# Patient Record
Sex: Male | Born: 1940 | Race: White | Hispanic: No | Marital: Married | Smoking: Never smoker
Health system: Southern US, Community
[De-identification: ages and names within clinical notes are randomized; demographics above are authoritative.]

## PROBLEM LIST (undated history)

## (undated) DIAGNOSIS — I10 Essential (primary) hypertension: Secondary | ICD-10-CM

## (undated) DIAGNOSIS — I2510 Atherosclerotic heart disease of native coronary artery without angina pectoris: Secondary | ICD-10-CM

## (undated) DIAGNOSIS — G9001 Carotid sinus syncope: Secondary | ICD-10-CM

## (undated) DIAGNOSIS — E785 Hyperlipidemia, unspecified: Secondary | ICD-10-CM

## (undated) DIAGNOSIS — K4090 Unilateral inguinal hernia, without obstruction or gangrene, not specified as recurrent: Secondary | ICD-10-CM

## (undated) DIAGNOSIS — I252 Old myocardial infarction: Secondary | ICD-10-CM

## (undated) HISTORY — DX: Essential (primary) hypertension: I10

## (undated) HISTORY — DX: Carotid sinus syncope: G90.01

## (undated) HISTORY — PX: CARDIAC CATHETERIZATION: SHX172

## (undated) HISTORY — PX: APPENDECTOMY: SHX54

## (undated) HISTORY — DX: Atherosclerotic heart disease of native coronary artery without angina pectoris: I25.10

## (undated) HISTORY — PX: COLON SURGERY: SHX602

## (undated) HISTORY — PX: COLONOSCOPY: SHX174

## (undated) HISTORY — DX: Old myocardial infarction: I25.2

## (undated) HISTORY — DX: Unilateral inguinal hernia, without obstruction or gangrene, not specified as recurrent: K40.90

## (undated) HISTORY — PX: OTHER SURGICAL HISTORY: SHX169

## (undated) HISTORY — DX: Hyperlipidemia, unspecified: E78.5

---

## 1998-08-26 ENCOUNTER — Encounter: Admission: RE | Admit: 1998-08-26 | Discharge: 1998-11-24 | Payer: Self-pay | Admitting: Family Medicine

## 1999-08-28 ENCOUNTER — Encounter: Payer: Self-pay | Admitting: Emergency Medicine

## 1999-08-28 ENCOUNTER — Inpatient Hospital Stay (HOSPITAL_COMMUNITY): Admission: EM | Admit: 1999-08-28 | Discharge: 1999-08-30 | Payer: Self-pay | Admitting: Emergency Medicine

## 2002-10-14 ENCOUNTER — Inpatient Hospital Stay (HOSPITAL_COMMUNITY): Admission: EM | Admit: 2002-10-14 | Discharge: 2002-10-17 | Payer: Self-pay | Admitting: Emergency Medicine

## 2002-10-14 ENCOUNTER — Encounter: Payer: Self-pay | Admitting: Emergency Medicine

## 2004-12-24 ENCOUNTER — Ambulatory Visit: Payer: Self-pay | Admitting: Cardiology

## 2004-12-29 ENCOUNTER — Ambulatory Visit: Payer: Self-pay | Admitting: Cardiology

## 2006-06-01 ENCOUNTER — Ambulatory Visit: Payer: Self-pay | Admitting: Cardiovascular Disease

## 2006-06-07 ENCOUNTER — Ambulatory Visit: Payer: Self-pay | Admitting: Cardiovascular Disease

## 2006-12-08 ENCOUNTER — Ambulatory Visit: Payer: Self-pay | Admitting: Cardiology

## 2006-12-13 ENCOUNTER — Ambulatory Visit: Payer: Self-pay | Admitting: Cardiology

## 2007-12-13 ENCOUNTER — Ambulatory Visit: Payer: Self-pay | Admitting: Internal Medicine

## 2007-12-13 LAB — CONVERTED CEMR LAB
ALT: 15 units/L (ref 0–53)
AST: 18 units/L (ref 0–37)
Albumin: 4.1 g/dL (ref 3.5–5.2)
Alkaline Phosphatase: 59 units/L (ref 39–117)
BUN: 16 mg/dL (ref 6–23)
Bilirubin, Direct: 0.2 mg/dL (ref 0.0–0.3)
CO2: 28 meq/L (ref 19–32)
Calcium: 9.1 mg/dL (ref 8.4–10.5)
Chloride: 103 meq/L (ref 96–112)
Cholesterol: 120 mg/dL (ref 0–200)
Creatinine, Ser: 1.1 mg/dL (ref 0.4–1.5)
GFR calc Af Amer: 86 mL/min
GFR calc non Af Amer: 71 mL/min
Glucose, Bld: 176 mg/dL — ABNORMAL HIGH (ref 70–99)
HDL: 31.5 mg/dL — ABNORMAL LOW (ref >39.0)
LDL Cholesterol: 67 mg/dL (ref 0–99)
Potassium: 4.3 meq/L (ref 3.5–5.1)
Sodium: 138 meq/L (ref 135–145)
Total Bilirubin: 2.3 mg/dL — ABNORMAL HIGH (ref 0.3–1.2)
Total CHOL/HDL Ratio: 3.8
Total Protein: 7 g/dL (ref 6.0–8.3)
Triglycerides: 106 mg/dL (ref 0–149)
VLDL: 21 mg/dL (ref 0–40)

## 2007-12-21 ENCOUNTER — Ambulatory Visit: Payer: Self-pay | Admitting: Internal Medicine

## 2009-01-23 ENCOUNTER — Ambulatory Visit: Payer: Self-pay | Admitting: Internal Medicine

## 2009-01-23 LAB — CONVERTED CEMR LAB
ALT: 14 units/L (ref 0–53)
AST: 17 units/L (ref 0–37)
Albumin: 3.9 g/dL (ref 3.5–5.2)
Alkaline Phosphatase: 69 units/L (ref 39–117)
BUN: 17 mg/dL (ref 6–23)
Bilirubin, Direct: 0.1 mg/dL (ref 0.0–0.3)
CO2: 29 meq/L (ref 19–32)
Calcium: 8.6 mg/dL (ref 8.4–10.5)
Chloride: 103 meq/L (ref 96–112)
Cholesterol: 106 mg/dL (ref 0–200)
Creatinine, Ser: 1.1 mg/dL (ref 0.4–1.5)
GFR calc Af Amer: 85 mL/min
GFR calc non Af Amer: 71 mL/min
Glucose, Bld: 161 mg/dL — ABNORMAL HIGH (ref 70–99)
HDL: 31.7 mg/dL — ABNORMAL LOW (ref >39.0)
LDL Cholesterol: 55 mg/dL (ref 0–99)
Potassium: 4.3 meq/L (ref 3.5–5.1)
Sodium: 139 meq/L (ref 135–145)
Total Bilirubin: 1.7 mg/dL — ABNORMAL HIGH (ref 0.3–1.2)
Total CHOL/HDL Ratio: 3.3
Total Protein: 6.6 g/dL (ref 6.0–8.3)
Triglycerides: 98 mg/dL (ref 0–149)
VLDL: 20 mg/dL (ref 0–40)

## 2009-01-29 ENCOUNTER — Encounter: Payer: Self-pay | Admitting: Internal Medicine

## 2009-01-29 ENCOUNTER — Ambulatory Visit: Payer: Self-pay | Admitting: Internal Medicine

## 2009-01-29 DIAGNOSIS — G909 Disorder of the autonomic nervous system, unspecified: Secondary | ICD-10-CM | POA: Insufficient documentation

## 2009-01-29 DIAGNOSIS — I10 Essential (primary) hypertension: Secondary | ICD-10-CM | POA: Insufficient documentation

## 2009-01-29 DIAGNOSIS — I2510 Atherosclerotic heart disease of native coronary artery without angina pectoris: Secondary | ICD-10-CM | POA: Insufficient documentation

## 2009-01-29 DIAGNOSIS — E785 Hyperlipidemia, unspecified: Secondary | ICD-10-CM | POA: Insufficient documentation

## 2009-07-02 ENCOUNTER — Encounter: Payer: Self-pay | Admitting: Internal Medicine

## 2009-08-26 ENCOUNTER — Telehealth (INDEPENDENT_AMBULATORY_CARE_PROVIDER_SITE_OTHER): Payer: Self-pay | Admitting: *Deleted

## 2009-08-30 ENCOUNTER — Telehealth: Payer: Self-pay | Admitting: Internal Medicine

## 2009-12-19 ENCOUNTER — Encounter: Payer: Self-pay | Admitting: Internal Medicine

## 2010-01-16 ENCOUNTER — Ambulatory Visit: Payer: Self-pay | Admitting: Internal Medicine

## 2010-01-19 ENCOUNTER — Inpatient Hospital Stay (HOSPITAL_COMMUNITY): Admission: EM | Admit: 2010-01-19 | Discharge: 2010-01-21 | Payer: Self-pay | Admitting: Emergency Medicine

## 2010-01-23 ENCOUNTER — Telehealth: Payer: Self-pay | Admitting: Cardiology

## 2010-01-27 ENCOUNTER — Telehealth (INDEPENDENT_AMBULATORY_CARE_PROVIDER_SITE_OTHER): Payer: Self-pay | Admitting: *Deleted

## 2010-01-28 ENCOUNTER — Ambulatory Visit: Payer: Self-pay | Admitting: Cardiology

## 2010-01-28 ENCOUNTER — Encounter (HOSPITAL_COMMUNITY): Admission: RE | Admit: 2010-01-28 | Discharge: 2010-04-08 | Payer: Self-pay | Admitting: Cardiology

## 2010-01-28 ENCOUNTER — Ambulatory Visit: Payer: Self-pay

## 2010-02-03 ENCOUNTER — Telehealth: Payer: Self-pay | Admitting: Internal Medicine

## 2010-02-13 ENCOUNTER — Encounter: Payer: Self-pay | Admitting: Internal Medicine

## 2010-05-15 ENCOUNTER — Encounter: Payer: Self-pay | Admitting: Internal Medicine

## 2010-08-14 ENCOUNTER — Encounter: Payer: Self-pay | Admitting: Internal Medicine

## 2011-01-06 NOTE — Assessment & Plan Note (Signed)
Summary: Cardiology Nuclear Study  Nuclear Med Background Indications for Stress Test: Evaluation for Ischemia   History: Echo, Heart Catheterization, Myocardial Infarction, Stents  History Comments: '03 MI > Heart Cath; EF=45%, Stents: LAD, CFX; patent previous RCA stent '04 Echo: Echo: NL, LVF     Nuclear Pre-Procedure Cardiac Risk Factors: Hypertension, Lipids Caffeine/Decaff Intake: None NPO After: 7:00 PM Lungs: clear IV 0.9% NS with Angio Cath: 18g     IV Site: (R) AC IV Started by: Stanton Kidney EMT-P Chest Size (in) 44     Height (in): 74 Weight (lb): 230 BMI: 29.64 Tech Comments: CBG= 96 @ 5:30 am this day, per Patient.  Nuclear Med Study 1 or 2 day study:  1 day     Stress Test Type:  Eugenie Birks Reading MD:  Olga Millers, MD     Referring MD:  D.Mclean Resting Radionuclide:  Technetium 60m Tetrofosmin     Resting Radionuclide Dose:  10.9 mCi  Stress Radionuclide:  Technetium 57m Tetrofosmin     Stress Radionuclide Dose:  31.0 mCi   Stress Protocol   Lexiscan: 0.4 mg   Stress Test Technologist:  Milana Na EMT-P     Nuclear Technologist:  Burna Mortimer Deal RT-N  Rest Procedure   Myocardial perfusion imaging was performed at rest 45 minutes following the intravenous administration of Myoview Technetium 78m Tetrofosmin.  Stress Procedure  The patient received IV Lexiscan 0.4 mg over 15-seconds.  Myoview injected at 30-seconds.  There were no significant changes with infusion.  Quantitative spect images were obtained after a 45 minute delay.  QPS Raw Data Images:  Acuisition technically good; normal left ventricular size. Stress Images:  There is decreased uptake in the distal anterior wall, distal inferior wall and apex. Rest Images:  There is decreased uptake in the distal anterior wall, distal inferior wall and apex; inferior defect slightly less prominent compared to the stress images. Subtraction (SDS):  These findings are consistent with small prior infarct  and minimal peri-infarct ischemia. Transient Ischemic Dilatation:  .97  (Normal <1.22)  Lung/Heart Ratio:  .24  (Normal <0.45)  Quantitative Gated Spect Images QGS EDV:  119 ml QGS ESV:  54 ml QGS EF:  55 % QGS cine images:  There is apical hypokinesis.   Overall Impression  Exercise Capacity: Lexiscan study with no exercise. BP Response: Normal blood pressure response. Clinical Symptoms: No chest pain ECG Impression: No significant ST segment change suggestive of ischemia. Overall Impression: Low risk lexiscan nuclear study with small prior infarct in the distal anterior wall, apex and distal inferior wall; minimal peri-infarct ischemia in the distal inferior wall.  Appended Document: Cardiology Nuclear Study ok  Appended Document: Cardiology Nuclear Study Left message to call back   Appended Document: Cardiology Nuclear Study pt aware per phone note 2/28

## 2011-01-06 NOTE — Assessment & Plan Note (Signed)
Summary: E4V   Primary Provider:  Dr Jeannetta Nap  CC:  no complaints.  History of Present Illness: Timothy [name] is a delightful 70 year old male with a history of coronary artery disease status post non-ST elevation myocardial infarction in 2001 and 2003.Both times had MI without previous anginal pain.  He has been treated with a bare-metal stent to the right coronary artery and Cypher drug-eluting stent to the circumflex. He has normal LV function. Medical history is also notable for diabetes, hypertension and hyperlipidemia. Previously on niacin but he stopped as he felt it wasn't helping.  He returns today for yearly f/u. No longer walking regulary due severe knee arthritis. Deos exercise bike for 20-30 minutes severeal tiems per week. No CP or SOB. Participating in clinicat trial at [facility] for diabetic medication. Keeping a close eye on his BP, lipids and weight.  Last lipid check TC 142 TG 160 HDL 40 LDL 70   Current Medications (verified): 1)  Metformin Hcl 1000 Mg Tabs (Metformin Hcl) .... Take 1 Tablet By Mouth Two Times A Day 2)  Ramipril 10 Mg Caps (Ramipril) .... Take One Capsule By Mouth Daily 3)  Plavix 75 Mg Tabs (Clopidogrel Bisulfate) .Marland Kitchen.. 1 Tab By Mouth Once Daily 4)  Metoprolol Tartrate 25 Mg Tabs (Metoprolol Tartrate) .... Take One Tablet By Mouth Twice A Day 5)  Pravastatin Sodium 40 Mg Tabs (Pravastatin Sodium) .... Take One Tablet By Mouth Daily At Bedtime 6)  Aspirin 81 Mg Tbec (Aspirin) .... Take One Tablet By Mouth Daily 7)  Glipizide 10 Mg Tabs (Glipizide) .Marland Kitchen.. 1 Tab By Mouth Two Times A Day 8)  Loratadine 10 Mg Tabs (Loratadine) .... Once Daily  Allergies (verified): No Known Drug Allergies  Vital Signs:  Patient profile:   70 year old male Height:      74 inches Weight:      232 pounds BMI:     29.89 Pulse rate:   55 / minute BP sitting:   132 / 74  (left arm) Cuff size:   regular  Vitals Entered By: Hardin Negus, RMA (January 16, 2010 11:29  AM)   Impression & Recommendations:  Problem # 1:  CAD, NATIVE VESSEL (ICD-414.01) Doing well though function status limited due to knee pain. Suggested he consider an ortho eval to see if he needs a knee relpacement. Given the fact that he did not have any angina prior to previous MIs and he is now 8 years out from last stent will move forward with [facility]  Problem # 2:  HYPERTENSION, BENIGN (ICD-401.1) Blood pressure well controlled. Continue current regimen.  Problem # 3:  HYPERLIPIDEMIA-MIXED (ICD-272.4) Lipids look good. LDL at goal <=70. Continue current regimen.  Other Orders: EKG w/ Interpretation (93000) Nuclear Stress Test (Nuc Stress Test)  Patient Instructions: 1)  Your physician has requested that you have a Lexiscan myoview.  For further information please visit [email or web address]  Please follow instruction sheet, as given. 2)  Follow up in 1 year

## 2011-01-06 NOTE — Progress Notes (Signed)
Summary: rtn call from friday  Phone Note Call from Patient Call back at Home Phone [phone]   Caller: Patient Reason for Call: Talk to Nurse, Talk to Doctor Summary of Call: pt rtrn call from Heather schubb from friday/lg Initial call taken by: Omer Jack,  February 03, 2010 10:49 AM  Follow-up for Phone Call        Called patient with stress test results...per Dr.Dan Wrigley Winborne test is OK. Follow-up by: Suzan Garibaldi RN

## 2011-01-06 NOTE — Progress Notes (Signed)
Summary: pt wants to talk about testing next week  Phone Note Call from Patient Call back at Home Phone [phone]   Caller: Patient Reason for Call: Talk to Nurse, Talk to Doctor Summary of Call: pt is scheduled for a stress test nest week and he just got out of the hospital with rectal bleedign and he wasn't sure if he would be in shape for the test so he wants to discuss this with you Initial call taken by: Omer Jack,  January 23, 2010 9:24 AM  Follow-up for Phone Call        spoke w/pt Meredith Staggers, RN  January 23, 2010 9:57 AM

## 2011-01-06 NOTE — Progress Notes (Signed)
Summary: Nuclear Pre-Procedure  Phone Note Outgoing Call Call back at [facility] Phone [phone]   Call placed by: Stanton Kidney, EMT-P,  January 27, 2010 2:40 PM Action Taken: Phone Call Completed Summary of Call: Reviewed information on Myoview Information Sheet (see scanned document for further details).  Spoke with Patient's wife.     Nuclear Med Background Indications for Stress Test: Evaluation for Ischemia   History: Echo, Heart Catheterization, Myocardial Infarction, Stents  History Comments: '03 MI > Heart Cath; EF=45%, Stents: LAD, CFX; patent previous RCA stent '04 Echo: Echo: NL, LVF     Nuclear Pre-Procedure Cardiac Risk Factors: Hypertension, Lipids Height (in): 74

## 2011-02-25 LAB — CBC
HCT: 21.6 % — ABNORMAL LOW (ref 39.0–52.0)
HCT: 23.2 % — ABNORMAL LOW (ref 39.0–52.0)
HCT: 26.2 % — ABNORMAL LOW (ref 39.0–52.0)
HCT: 36.6 % — ABNORMAL LOW (ref 39.0–52.0)
Hemoglobin: 12.6 g/dL — ABNORMAL LOW (ref 13.0–17.0)
Hemoglobin: 7.6 g/dL — ABNORMAL LOW (ref 13.0–17.0)
Hemoglobin: 8 g/dL — ABNORMAL LOW (ref 13.0–17.0)
Hemoglobin: 9.6 g/dL — ABNORMAL LOW (ref 13.0–17.0)
MCHC: 34.1 g/dL (ref 30.0–36.0)
MCHC: 34.3 g/dL (ref 30.0–36.0)
MCHC: 35 g/dL (ref 30.0–36.0)
MCV: 94.1 fL (ref 78.0–100.0)
MCV: 94.5 fL (ref 78.0–100.0)
MCV: 95.3 fL (ref 78.0–100.0)
MCV: 95.5 fL (ref 78.0–100.0)
Platelets: 154 10*3/uL (ref 150–400)
Platelets: 182 10*3/uL (ref 150–400)
RBC: 2.44 MIL/uL — ABNORMAL LOW (ref 4.22–5.81)
RBC: 2.95 MIL/uL — ABNORMAL LOW (ref 4.22–5.81)
RBC: 2.95 MIL/uL — ABNORMAL LOW (ref 4.22–5.81)
RDW: 12.9 % (ref 11.5–15.5)
RDW: 12.9 % (ref 11.5–15.5)
RDW: 13.6 % (ref 11.5–15.5)
WBC: 11.1 10*3/uL — ABNORMAL HIGH (ref 4.0–10.5)
WBC: 6.1 10*3/uL (ref 4.0–10.5)
WBC: 7.6 10*3/uL (ref 4.0–10.5)
WBC: 8.2 10*3/uL (ref 4.0–10.5)

## 2011-02-25 LAB — TYPE AND SCREEN: Antibody Screen: NEGATIVE

## 2011-02-25 LAB — GLUCOSE, CAPILLARY
Glucose-Capillary: 124 mg/dL — ABNORMAL HIGH (ref 70–99)
Glucose-Capillary: 131 mg/dL — ABNORMAL HIGH (ref 70–99)
Glucose-Capillary: 134 mg/dL — ABNORMAL HIGH (ref 70–99)
Glucose-Capillary: 150 mg/dL — ABNORMAL HIGH (ref 70–99)
Glucose-Capillary: 168 mg/dL — ABNORMAL HIGH (ref 70–99)
Glucose-Capillary: 239 mg/dL — ABNORMAL HIGH (ref 70–99)

## 2011-02-25 LAB — ABO/RH: ABO/RH(D): O NEG

## 2011-02-25 LAB — DIFFERENTIAL
Basophils Relative: 1 % (ref 0–1)
Eosinophils Relative: 3 % (ref 0–5)
Lymphocytes Relative: 11 % — ABNORMAL LOW (ref 12–46)
Lymphocytes Relative: 22 % (ref 12–46)
Lymphs Abs: 1.2 10*3/uL (ref 0.7–4.0)
Monocytes Absolute: 0.3 10*3/uL (ref 0.1–1.0)
Monocytes Relative: 3 % (ref 3–12)
Monocytes Relative: 8 % (ref 3–12)
Neutro Abs: 4 10*3/uL (ref 1.7–7.7)
Neutrophils Relative %: 63 % (ref 43–77)

## 2011-02-25 LAB — POCT I-STAT, CHEM 8
BUN: 31 mg/dL — ABNORMAL HIGH (ref 6–23)
Creatinine, Ser: 1.2 mg/dL (ref 0.4–1.5)
Hemoglobin: 12.6 g/dL — ABNORMAL LOW (ref 13.0–17.0)
Potassium: 4.6 mEq/L (ref 3.5–5.1)
Sodium: 139 mEq/L (ref 135–145)
TCO2: 27 mmol/L (ref 0–100)

## 2011-02-25 LAB — COMPREHENSIVE METABOLIC PANEL
Alkaline Phosphatase: 38 U/L — ABNORMAL LOW (ref 39–117)
BUN: 16 mg/dL (ref 6–23)
Calcium: 7.5 mg/dL — ABNORMAL LOW (ref 8.4–10.5)
Creatinine, Ser: 1.06 mg/dL (ref 0.4–1.5)
Glucose, Bld: 122 mg/dL — ABNORMAL HIGH (ref 70–99)
Potassium: 3.9 mEq/L (ref 3.5–5.1)
Total Protein: 4.4 g/dL — ABNORMAL LOW (ref 6.0–8.3)

## 2011-02-25 LAB — BASIC METABOLIC PANEL
CO2: 21 mEq/L (ref 19–32)
Calcium: 8 mg/dL — ABNORMAL LOW (ref 8.4–10.5)
Chloride: 112 mEq/L (ref 96–112)
GFR calc Af Amer: >60 mL/min (ref >60)
Sodium: 142 mEq/L (ref 135–145)

## 2011-02-25 LAB — CARDIAC PANEL(CRET KIN+CKTOT+MB+TROPI)
CK, MB: 1.3 ng/mL (ref 0.3–4.0)
Total CK: 34 U/L (ref 7–232)
Troponin I: 0.01 ng/mL (ref 0.00–0.06)

## 2011-02-25 LAB — PROTIME-INR: INR: 1.11 (ref 0.00–1.49)

## 2011-02-25 LAB — CK TOTAL AND CKMB (NOT AT ARMC)
CK, MB: 1.3 ng/mL (ref 0.3–4.0)
Relative Index: INVALID (ref 0.0–2.5)
Total CK: 39 U/L (ref 7–232)

## 2011-02-25 LAB — PHOSPHORUS
Phosphorus: 2.5 mg/dL (ref 2.3–4.6)
Phosphorus: 2.6 mg/dL (ref 2.3–4.6)

## 2011-02-25 LAB — TSH: TSH: 1.75 u[IU]/mL (ref 0.350–4.500)

## 2011-02-25 LAB — HEMOGLOBIN A1C
Hgb A1c MFr Bld: 6.7 % — ABNORMAL HIGH (ref 4.6–6.1)
Mean Plasma Glucose: 146 mg/dL

## 2011-02-25 LAB — MRSA PCR SCREENING: MRSA by PCR: NEGATIVE

## 2011-03-10 ENCOUNTER — Telehealth: Payer: Self-pay | Admitting: Internal Medicine

## 2011-03-11 ENCOUNTER — Other Ambulatory Visit: Payer: Self-pay | Admitting: *Deleted

## 2011-03-11 MED ORDER — CLOPIDOGREL BISULFATE 75 MG PO TABS: 75.0000 mg | ORAL_TABLET | Freq: Every day | ORAL | Status: DC

## 2011-03-11 MED ORDER — RAMIPRIL 10 MG PO CAPS: 10.0000 mg | ORAL_CAPSULE | Freq: Every day | ORAL | Status: DC

## 2011-04-17 ENCOUNTER — Other Ambulatory Visit: Payer: Self-pay | Admitting: *Deleted

## 2011-04-17 MED ORDER — PRAVASTATIN SODIUM 40 MG PO TABS: 40.0000 mg | ORAL_TABLET | Freq: Every evening | ORAL | Status: DC

## 2011-04-17 MED ORDER — METOPROLOL TARTRATE 25 MG PO TABS: 25.0000 mg | ORAL_TABLET | Freq: Two times a day (BID) | ORAL | Status: DC

## 2011-04-20 ENCOUNTER — Telehealth: Payer: Self-pay | Admitting: Internal Medicine

## 2011-04-20 NOTE — Telephone Encounter (Signed)
Pt called in Pres refill Friday of last week.  Metoprolol, Pravastin,  [facility] on [address]. Not received.

## 2011-04-20 NOTE — Telephone Encounter (Signed)
Done on 5/11

## 2011-04-21 NOTE — Assessment & Plan Note (Signed)
[facility]                            CARDIOLOGY OFFICE NOTE   NAME:[name], [name]                        MRN:          [id number]  DATE:12/21/2007                            DOB:          [birth year]    PRIMARY CARE PHYSICIAN:  Windle Guard, MD   INTERVAL HISTORY:  Mr. Timothy [name] is a delightful 70 year old male,  previously followed by Dr. Samule Ohm.  He has a history of coronary artery  disease, status post non ST elevation myocardial infarction x2 in 2001  and 2003, treated with bare metal stenting of the right coronary artery  and Cypher drug-eluting stent to the circumflex.  He has normal LV  function.  Medical history is also notable for diabetes, hypertension,  and hyperlipidemia.   He returns today for his yearly followup.  He is doing great.  He  continues to exercise.  He is walking about 3-4 times a week for 30 to  40 minutes.  He is limited by his knee pain.  He denies any chest pain  or shortness of breath.  He had been taking his blood pressure somewhat  regularly and systolics have been running in the 120 to 130 range.  He  has not had any heart failure symptoms, no palpitations, syncope, or  presyncope.   CURRENT MEDICATIONS:  1. Altace 10 a day.  2. Plavix 75 a day.  3. Toprol XL 25 a day.  4. Lipitor 40 a day.  5. Glipizide 10 b.i.d.  6. Aspirin 81.  7. Metformin 1000 b.i.d.   PHYSICAL EXAMINATION:  He is well-appearing, in no acute distress,  ambulatory in the clinic without any respiratory difficulty.  Blood  pressure is 130/70.  Heart rate is 64.  Weight is 241.  HEENT:  Normal.  NECK:  Supple.  There is no JVD.  Carotids are 2+ bilateral, without  bruits.  There is no lymphadenopathy or thyromegaly.  CARDIAC:  PMI is not displaced, irregular rate and rhythm.  No murmurs,  rubs, or gallops.  LUNGS:  Clear.  ABDOMEN:  Soft, nontender, nondistended.  Mildly obese.  There is no  hepatosplenomegaly, no bruits, no masses.   Good bowel sounds.  EXTREMITIES:  Warm with no cyanosis, clubbing, or edema, no rash.  NEURO:  Alert and oriented x3.  Cranial nerves 2-12 grossly intact.  Moves all 4 extremities without difficulty.  Affect is very pleasant.   EKG shows normal sinus rhythm at a rate of 61.  No significant ST-T wave  abnormalities.  There are U-waves.   ASSESSMENT AND PLAN:  1. Coronary artery disease status post previous myocardial infarction.      He is doing well with no evidence of ischemia.  Continue current      therapy.  2. Hypertension.  Blood pressure is on the upper end of normal.      Continue Toprol and Altace for now.  I did tell him if he notices      that his systolics are persistently over 130, I would probably      change him over to Coreg 6.25 b.i.d.  3. Hyperlipidemia.  Most recent cholesterol panel showed a total      cholesterol of 120, HDL of 32, LDL of 67, triglycerides of 106.      Bilirubin is chronically elevated at 2.3.  LFTs are otherwise      normal.  His LDL is at goal.  HDL is low.  He has previously failed      Niaspan due to side effects and was also unable to tolerated fish      oil.  We have decided to retry low dose fish oil 1 g once a day,      and if he tolerates this he can go to b.i.d.  4. Diabetes, followed by Dr. Jeannetta Nap.   DISPOSITION:  We will see him back in 1 year.  Dr. Jeannetta Nap scheduled to  check lipids in 3-4 months.  I would like to see a copy of these.     Bevelyn Buckles. Bensimhon, MD  Electronically Signed    DRB/MedQ  DD: 12/21/2007  DT: 12/21/2007  Job #: [id number]   cc:   Windle Guard, M.D.

## 2011-04-24 NOTE — H&P (Signed)
NAME:  Timothy [name], Timothy [name] NO.:  [id number]   MEDICAL RECORD NO.:  [id number]                   PATIENT TYPE:  INP   LOCATION:  2929                                 FACILITY:  [facility]   PHYSICIAN:  Arvilla Meres, M.D. [facility]          DATE OF BIRTH:  May 15, 1941   DATE OF ADMISSION:  10/14/2002  DATE OF DISCHARGE:                                HISTORY & PHYSICAL   PRIMARY CARE PHYSICIAN:  Osvaldo Shipper. Spruill, M.D. in [place].   PRIMARY CARDIOLOGIST:  Veneda Melter, M.D. of [facility].   HISTORY OF PRESENT ILLNESS:  The patient is a very pleasant 70 year old  white male with a history of coronary artery disease, status post non-ST  elevation myocardial infarction in 9/01, which was treated with percutaneous  transluminal coronary angioplasty and stent of his right coronary artery,  history of diabetes who was admitted through the ER for acute coronary  syndrome.   The patient's cardiac history began in 9/01, when he presented with a non-ST  elevation myocardial infarction.  Catheterization at that time showed a  normal left ventricular ejection fraction with a totally occluded right  coronary artery.  His left anterior descending had 50% proximal lesion, as  well as a 50% distal lesion.  There was an 80% stenosis in the first  diagonal.  The ramus had a 30% proximal lesion, and the left circumflex had  a 30 to 40% proximal lesion.  He underwent a successful percutaneous  transluminal coronary angioplasty and stent of his proximal right coronary  artery.  Also had multiple angioplasties without stent to the distal right  coronary artery at that time.   Since that time, he has done very well without any further chest pain.  He  remains quite active around the house, though he does not participate in a  formal exercise program.  Over the past week he has had progressive anginal  symptoms.  Today, he had more then 10 episodes of severe chest pain at  rest  which required multiple nitroglycerin tablets.  His wife finally convinced  him to come to the emergency room.  In the emergency room, he continued to  have 8/10 chest pain despite high dose IV nitroglycerin and multiple doses  of intravenous morphine.   REVIEW OF SYMPTOMS:  He denies any fevers, chills, nausea, vomiting, bright  red blood per rectum, or melena.   PAST MEDICAL HISTORY:  1. Coronary artery disease, status post non-ST elevation myocardial     infarction in 9/01, status post right coronary artery percutaneous     transluminal coronary angioplasty and stent as per HPI.  2. Diabetes mellitus type 2, diet controlled.   MEDICATIONS:  1. Aspirin 325 mg.  2. Nitroglycerin p.r.n.   ALLERGIES:  No known drug allergies.   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  He lives with his wife in [place],  [place].  He denies any tobacco or alcohol.   PHYSICAL EXAMINATION:  GENERAL:  He is uncomfortable, diaphoretic.  VITAL SIGNS:  He is afebrile with a blood pressure of 140/80, heart rate of  65.  NECK:  His JVP is flat, his carotids are 2+ bilaterally without bruits.  CARDIAC:  Regular rate and rhythm, no murmurs, rubs, or gallops.  LUNGS:  Clear to auscultation.  ABDOMEN:  Soft, benign.  EXTREMITIES:  No cyanosis, clubbing, or edema.  His femoral pulses are 2+  bilaterally without bruits.  Dorsalis pedis pulses are 2+ bilaterally.   LABORATORY DATA:  His white blood cell count is 7.4, hematocrit 39%,  platelets 223,000.  Electrolytes are pending.  His total CK is 131, MB and  troponin-I are pending.  His PTT is 27, his INR is 0.9.  EKG is sinus  bradycardia with a rate of 60.  There is no ischemia.  Chest x-ray shows no  acute disease.   ASSESSMENT AND PLAN:  This is a 70 year old white male with a history of  coronary artery disease, who presents with unstable angina/acute coronary  syndrome.   Given his refractory pain despite high dose nitroglycerin and  several doses of morphine, I discussed his case with Dr. Charlies Constable, and  the plan will be to take him emergently to the cardiac catheterization  laboratory.  We will continue to treat him with IV nitroglycerin, morphine,  aspirin, heparin, and Integrilin.  I have held his beta blocker secondary to  his bradycardia, and have not given him Plavix as he might be a potential  coronary artery bypass grafting candidate.  Given his coronary disease, I  think it is reasonable to start him on Simvastatin for production from  recurrent coronary events.   As for his diabetes, his glucose is quite elevated in the emergency room,  however, this may just be due to a stress reaction.  We will check a  hemoglobin A1C, and I suspect he may need an oral agent.  Given his diabetes  and coronary disease, he would benefit from the addition of an ACE inhibitor  such as Ramipril once he is finished with his catheterization.                                               Arvilla Meres, M.D. [facility]    DB/MEDQ  D:  10/14/2002  T:  10/16/2002  Job:  [id number]

## 2011-04-24 NOTE — Cardiovascular Report (Signed)
NAME:  Timothy [name], Timothy [name] NO.:  [id number]   MEDICAL RECORD NO.:  [id number]                   PATIENT TYPE:  INP   LOCATION:  2929                                 FACILITY:  [facility]   PHYSICIAN:  Charlies Constable, M.D. [facility]              DATE OF BIRTH:  1941-01-14   DATE OF PROCEDURE:  10/16/2002  DATE OF DISCHARGE:                              CARDIAC CATHETERIZATION   CLINICAL HISTORY:  The patient is 70 years old and had stenting of the right  coronary artery 2 or 3 years ago and came in with non-Q-wave anterior wall  myocardial infarction Saturday night and underwent stenting of a 99%  stenosis in the LAD with TIMI-1 flow.  He was brought back today for  intervention on the mid circumflex lesion.   PROCEDURES PERFORMED:  Left heart catheterization, selective coronary  angiography, stenting of the circumflex artery.   DESCRIPTION OF PROCEDURE:  The procedure was performed via the right femoral  artery using an arterial sheath and 6 French preformed coronary catheters.  A front wall arterial puncture was performed and Omnipaque contrast was  used.  We first did a left ventriculogram.  We then used a guiding catheter  which as a 7 [place] CLS 4.0 guiding catheter with side holes.  We used a  short BMW wire.  The patient was on Integrilin drip prior to the procedure  and was given weight-adjusted heparin to prolong the ACT to greater than 200  seconds during the procedure.  He had also been on Plavix.  We were able to  pass the BMW wire down the circumflex artery and crossed the lesion in the  mid vessel without difficulty.  We direct stented with a 3.5 x 18 mm Cypher  stent deploying this with one inflation of atmospheres for 35 seconds.  Repeat diagnostic studies were then performed through the guiding catheter.  The patient tolerated the procedure well and left the laboratory in  satisfactory condition.   RESULTS:  The left main coronary artery:  The left  main coronary artery was  widely patent at the stent site with less than 10% stenosis.  The diagonal  branch, which arose from the stent site had a 90% stenosis.  There was 50%  stenosis downstream from the stent site in the distal vessel.   Circumflex artery:  The circumflex artery had an 80% stenosis in its  midportion.   LEFT VENTRICULOGRAM:  The left ventriculogram performed in the RAO  projection showed akinesis of the apex.  The anterolateral wall movement was  fairly good and much improved from before.  The estimated ejection fraction  was 45%.   Following stenting of the circumflex artery, the stenosis improved from 80%  to 0%.   CONCLUSIONS:  1. Coronary artery disease, status post recent non-ST segment elevation     anterior myocardial infarction treated with coronary stenting with less  than 10% stenosis at the stent site in the proximal left anterior     descending, 90% narrowing in a small diagonal branch and 50% narrowing in     the distal left anterior descending and 80% narrowing in the mid     circumflex artery with apical wall akinesis and an estimated ejection     fraction of 45%.  2. Successful stenting of the lesion in the mid circumflex artery with     improvement in percent diameter narrowing from 80% to 0% with a Cypher     stent.   DISPOSITION:  The patient was returned to the postangioplasty unit for  further observation.                                                    Charlies Constable, M.D. [facility]    BB/MEDQ  D:  10/16/2002  T:  10/17/2002  Job:  [id number]   cc:   Osvaldo Shipper. Spruill, M.D.  [address] [id number]  [place]  [place] [address]  Fax: [phone]   Veneda Melter, M.D. [facility]   Cardiopulmonary Laboratory

## 2011-04-24 NOTE — Discharge Summary (Signed)
NAME:  Timothy [name], Timothy [name] NO.:  [id number]   MEDICAL RECORD NO.:  [id number]                   PATIENT TYPE:  INP   LOCATION:  2929                                 FACILITY:  [facility]   PHYSICIAN:  Veneda Melter, M.D. [facility]               DATE OF BIRTH:  1941/10/20   DATE OF ADMISSION:  10/14/2002  DATE OF DISCHARGE:  10/17/2002                                 DISCHARGE SUMMARY   DISCHARGE DIAGNOSES:  1. Non-ST segment myocardial infarction.  2. Coronary artery disease, status post percutaneous coronary intervention     times two.  3. Diabetes mellitus.   HOSPITAL COURSE:  The patient is a pleasant 70 year old male with known  coronary artery disease.  He presented to the [facility]  emergency room on October 14, 2002 with chest pain.  He was seen and  admitted by Dr. Arvilla Meres.  Dr. Gala Romney felt that the patient had  unstable angina/acute coronary syndrome.  His pain was noted to be  refractory despite high dose nitroglycerin and multiple doses of morphine  and urgent catheterization was planned.  The patient was taken to the  catheterization lab emergently by Dr. Charlies Constable.   CATHETERIZATION RESULTS:  1. Left main coronary artery:  Free of significant disease.  2. Left anterior descending:  95% stenosis after the first septal perforator     with TIMI 1 flow distally.  3. Left circumflex:  There is an 80% narrowing to the mid circumflex before     the marginal branch.  There is also a 70% narrowing in the ramus     intermedius branch.  4. Right coronary artery:  There is a 40% narrowing in the proximal right     coronary artery with less than 10% narrowing at the stent of proximal     right coronary artery.  There is also 70% narrowing in the distal right     coronary artery just before the posterior descending branch.   LEFT VENTRICULOGRAM:  Anterior lateral/apical akinesis, ejection fraction of  35 to 40%.   Dr. Juanda Chance  then performed angioplasty and stenting to the lesion of the  proximal left anterior descending.  Stenosis was reduced from 99% to less  than 10% with resulting TIMI 3 flow.  Dr. Juanda Chance planned for staged  percutaneous coronary intervention on the lesion in the circumflex artery in  a few days.   On October 16, 2002 the patient was taken back to the catheterization lab  by Dr. Juanda Chance.  There was no stenosis in the previously placed stent in the  left anterior descending, 90% lesion in the diagonal as well as 50% in the  distal lesion were noted.  Dr. Juanda Chance then performed angioplasty with  stenting to the lesion of the circumflex.  This reduced down from 80% to 0%  with placement of CYPHER stent.  Repeat left ventriculogram showed  apical  hypokinesis and anterior lateral wall was noted to move better.  The  ejection fraction was re-calculated at 45%.   The next day the patient was doing well, had no chest pain or shortness of  breath.  His groin was without hematoma or bruits and he was felt to be  stable for discharge.   DISCHARGE MEDICATIONS:  1. Enteric coated aspirin 325 mg one q.d.  2. Zocor 40 mg q.h.s.  3. Plavix 75 mg q.d. for 3 months.  4. Altace 2.5 mg q.d.  5. Toprol XL 25 mg q.d.  6. Sublingual nitroglycerin as needed.  7. Glipizide 5 mg q.a.m.   LABORATORY DATA:  White blood cell count 7.4, hemoglobin 13.4, hematocrit  38.7, platelet count 204,000.  Sodium 140, potassium 4.0, chloride 105, cO2  30, BUN 13, creatinine 1.2, glucose 185.  Total cholesterol 164,  triglycerides 221, HDL 30, LDL 90.  Total cholesterol to HDL ratio 5.5.  VLDL 44. Urinalysis showed no spilling of glucose or proteins.  Of note, the  hemoglobin A1C was 8.6.   Chest x-ray showed stable cardiomegaly and no active disease.  Electrocardiogram shows normal sinus rhythm at 70.  There was also noted to  be a left axis deviation as well as signs of an anteroseptal infarction.  PR  interval 148, QRS  88, QTC 514, axis -32.   DISCHARGE INSTRUCTIONS:  1. Patient is to avoid driving, heavy lifting or tub baths for two days.  2. Patient is to remain off work until seen in the office.  3. Patient is to follow a low fat, diabetic diet.  4. Patient is to watch his catheterization site for any pain, bleeding or     swelling and to call our office if he has any problems.  5. Patient is to follow up with a P.A. visit in the office on October 31, 2002 at noon.  6. Patient is to follow up with Dr. Chales Abrahams on December 05, 2002 at 9:45 in     the morning.     Annett Fabian, P.A. [facility]                  Veneda Melter, M.D. [facility]    CKM/MEDQ  D:  10/17/2002  T:  10/17/2002  Job:  [id number]   cc:   Osvaldo Shipper. Spruill, M.D.  [address] [address]  [place]  [place] [address]  Fax: [phone]

## 2011-08-21 ENCOUNTER — Encounter: Payer: Self-pay | Admitting: Internal Medicine

## 2011-09-09 ENCOUNTER — Telehealth: Payer: Self-pay | Admitting: Internal Medicine

## 2011-09-09 NOTE — Telephone Encounter (Signed)
Walk in pt Form " pt Dropped off Copy of labs from [facility]" Placed in Bensimhon's  Doc box.  09/09/11/km

## 2011-09-10 ENCOUNTER — Telehealth (HOSPITAL_COMMUNITY): Payer: Self-pay | Admitting: *Deleted

## 2011-09-10 ENCOUNTER — Telehealth: Payer: Self-pay | Admitting: *Deleted

## 2011-09-10 NOTE — Telephone Encounter (Signed)
[name] from [facility]. called for Timothy [name], he called their office requesting refills on his meds and would like someone to look into it.

## 2011-09-10 NOTE — Telephone Encounter (Signed)
Spoke w/[name] she will handle this

## 2011-09-10 NOTE — Telephone Encounter (Signed)
Pt dropped off paperwork in office requesting refills of Plavix and Ramipril.  Appears he was last seen in office by Dr. Gala Romney in Feb. 2011.  I reviewed with Heather and pt does not have heart failure so he does not need to be seen by Dr. Gala Romney and can see any physician in office.  I called and spoke with wife who confirmed pt has not been seen since Feb. 2011. She will have husband call us tomorrow to make appt as he is at work today.  I told wife we did not want pt to run out of medicines and we could give refills until he can be seen in office.  He will let us know when he calls if he needs refills prior to appt

## 2011-09-11 ENCOUNTER — Telehealth: Payer: Self-pay | Admitting: Cardiovascular Disease

## 2011-09-11 DIAGNOSIS — I2510 Atherosclerotic heart disease of native coronary artery without angina pectoris: Secondary | ICD-10-CM

## 2011-09-11 MED ORDER — RAMIPRIL 10 MG PO CAPS: 10.0000 mg | ORAL_CAPSULE | Freq: Every day | ORAL | Status: DC

## 2011-09-11 MED ORDER — CLOPIDOGREL BISULFATE 75 MG PO TABS: 75.0000 mg | ORAL_TABLET | Freq: Every day | ORAL | Status: DC

## 2011-09-11 NOTE — Telephone Encounter (Signed)
See phone note dated September 11, 2011 for refills and appt information.

## 2011-09-11 NOTE — Telephone Encounter (Signed)
Spoke with pt's wife. Pt has just left for work.  I told wife I would send prescriptions below to [facility]. She will rely information to pt and have him call us if he has questions.  He has appt with Dr.McAlhany on September 30, 2011.

## 2011-09-11 NOTE — Telephone Encounter (Signed)
Pt was Timothy [name] and now will seeMcAlhany  He needs prescriptions for Plavix generic and Ramipril called into [facility] on [address].  Pt is out of his medication.  Please advise patient of outcome of call.

## 2011-09-29 ENCOUNTER — Encounter: Payer: Self-pay | Admitting: Cardiovascular Disease

## 2011-09-30 ENCOUNTER — Ambulatory Visit (INDEPENDENT_AMBULATORY_CARE_PROVIDER_SITE_OTHER): Payer: Medicare Other | Admitting: Cardiovascular Disease

## 2011-09-30 ENCOUNTER — Encounter: Payer: Self-pay | Admitting: Cardiovascular Disease

## 2011-09-30 VITALS — BP 123/76 | HR 61 | Ht 74.0 in | Wt 223.0 lb

## 2011-09-30 DIAGNOSIS — I10 Essential (primary) hypertension: Secondary | ICD-10-CM

## 2011-09-30 DIAGNOSIS — I2510 Atherosclerotic heart disease of native coronary artery without angina pectoris: Secondary | ICD-10-CM

## 2011-09-30 MED ORDER — RAMIPRIL 10 MG PO CAPS: 10.0000 mg | ORAL_CAPSULE | Freq: Every day | ORAL | Status: DC

## 2011-09-30 MED ORDER — CLOPIDOGREL BISULFATE 75 MG PO TABS: 75.0000 mg | ORAL_TABLET | Freq: Every day | ORAL | Status: AC

## 2011-09-30 NOTE — Assessment & Plan Note (Signed)
Stable. Continue Plavix, beta blocker, Ace-inh and statin. He is not on an ASA due to GI upset.

## 2011-09-30 NOTE — Assessment & Plan Note (Signed)
BP well controlled. No changes.  

## 2011-09-30 NOTE — Patient Instructions (Signed)
Your physician wants you to follow-up in:  12 months.  You will receive a reminder letter in the mail two months in advance. If you don't receive a letter, please call our office to schedule the follow-up appointment.   

## 2011-09-30 NOTE — Progress Notes (Signed)
History of Present Illness:70 yo WM with a history of coronary artery disease status post non-ST elevation myocardial infarction in 2001 and 2003.Both times had MI without previous anginal pain.  He has been treated with a bare-metal stent to the right coronary artery and Cypher drug-eluting stent to the circumflex. He has normal LV function. Medical history is also notable for diabetes, hypertension and hyperlipidemia. Previously on niacin but he stopped as he felt it wasn't helping. He has been followed in the past by Dr. Leory Plowman. Stress myoview February 2011 with LVEF of 55%. No ischemia.   He returns today for yearly f/u. No longer walking regularly due severe knee arthritis.  No CP or SOB.   Last lipid check August 30,2012:  TC 153 TG 125  HDL 45 LDL 83  Past Medical History  Diagnosis Date  . Coronary artery disease     NSTEMI 2001, 2003   . Hypertension   . Diabetes mellitus     Past Surgical History  Procedure Date  . None     Current Outpatient Prescriptions  Medication Sig Dispense Refill  . clopidogrel (PLAVIX) 75 MG tablet Take 1 tablet (75 mg total) by mouth daily.  30 tablet  3  . glipiZIDE (GLUCOTROL XL) 10 MG 24 hr tablet Take 2 tabs twice a day      . loratadine (CLARITIN) 10 MG tablet Take 10 mg by mouth daily.        . metFORMIN (GLUCOPHAGE) 1000 MG tablet 1 tab bid      . metoprolol tartrate (LOPRESSOR) 25 MG tablet Take 1 tablet (25 mg total) by mouth 2 (two) times daily.  180 tablet  3  . pravastatin (PRAVACHOL) 40 MG tablet Take 1 tablet (40 mg total) by mouth every evening.  90 tablet  3  . ramipril (ALTACE) 10 MG capsule Take 1 capsule (10 mg total) by mouth daily.  30 capsule  3    No Known Allergies  History   Social History  . Marital Status: Married    Spouse Name: N/A    Number of Children: N/A  . Years of Education: N/A   Occupational History  . Not on file.   Social History Main Topics  . Smoking status: Never Smoker   . Smokeless  tobacco: Not on file  . Alcohol Use: Not on file  . Drug Use: Not on file  . Sexually Active: Not on file   Other Topics Concern  . Not on file   Social History Narrative  . No narrative on file    No family history on file.  Review of Systems:  As stated in the HPI and otherwise negative.   BP 123/76  Pulse 61  Ht 6\' 2"  (1.88 m)  Wt 223 lb (101.152 kg)  BMI 28.63 kg/m2  Physical Examination: General: Well developed, well nourished, NAD HEENT: OP clear, mucus membranes moist SKIN: warm, dry. No rashes. Neuro: No focal deficits Musculoskeletal: Muscle strength 5/5 all ext Psychiatric: Mood and affect normal Neck: No JVD, no carotid bruits, no thyromegaly, no lymphadenopathy. Lungs:Clear bilaterally, no wheezes, rhonci, crackles Cardiovascular: Regular rate and rhythm. No murmurs, gallops or rubs. Abdomen:Soft. Bowel sounds present. Non-tender.  Extremities: No lower extremity edema. Pulses are 2 + in the bilateral DP/PT.

## 2011-12-08 HISTORY — PX: ROTATOR CUFF REPAIR: SHX139

## 2011-12-08 HISTORY — PX: EYE SURGERY: SHX253

## 2012-04-14 ENCOUNTER — Other Ambulatory Visit: Payer: Self-pay | Admitting: Internal Medicine

## 2012-06-16 ENCOUNTER — Encounter: Payer: Self-pay | Admitting: Cardiovascular Disease

## 2012-07-18 ENCOUNTER — Other Ambulatory Visit: Payer: Self-pay | Admitting: Internal Medicine

## 2012-08-22 ENCOUNTER — Telehealth: Payer: Self-pay | Admitting: Cardiovascular Disease

## 2012-08-22 NOTE — Telephone Encounter (Signed)
LMOM

## 2012-08-22 NOTE — Telephone Encounter (Signed)
Dr. Madelon Lips is doing rotator cuff surgery and pt needs to know if he needs to stop plavix

## 2012-08-23 NOTE — Telephone Encounter (Signed)
Pt. states his surgery is scheduled for next Wednesday (9-25). Per patient, if he does not answer phone it is ok to leave message.

## 2012-08-23 NOTE — Telephone Encounter (Signed)
Wife notified.

## 2012-08-23 NOTE — Telephone Encounter (Signed)
Patient returning nurse call, he can be reached at hm# °

## 2012-08-23 NOTE — Telephone Encounter (Signed)
OK to hold Plavix one week prior to his surgery. cdm

## 2012-08-23 NOTE — Telephone Encounter (Signed)
Left message to call back.  Will fax this note to Dr. Candise Bowens office

## 2012-09-17 ENCOUNTER — Observation Stay (HOSPITAL_COMMUNITY)
Admission: EM | Admit: 2012-09-17 | Discharge: 2012-09-18 | Disposition: A | Discharge status: Home / Self Care | Payer: Medicare Other | Attending: Family Medicine | Admitting: Family Medicine

## 2012-09-17 ENCOUNTER — Other Ambulatory Visit: Payer: Self-pay

## 2012-09-17 ENCOUNTER — Encounter (HOSPITAL_COMMUNITY): Payer: Self-pay | Admitting: Adult Health

## 2012-09-17 ENCOUNTER — Emergency Department (HOSPITAL_COMMUNITY): Payer: Medicare Other

## 2012-09-17 DIAGNOSIS — R0602 Shortness of breath: Secondary | ICD-10-CM | POA: Insufficient documentation

## 2012-09-17 DIAGNOSIS — I2510 Atherosclerotic heart disease of native coronary artery without angina pectoris: Secondary | ICD-10-CM | POA: Insufficient documentation

## 2012-09-17 DIAGNOSIS — R079 Chest pain, unspecified: Principal | ICD-10-CM | POA: Insufficient documentation

## 2012-09-17 DIAGNOSIS — I10 Essential (primary) hypertension: Secondary | ICD-10-CM | POA: Insufficient documentation

## 2012-09-17 DIAGNOSIS — E119 Type 2 diabetes mellitus without complications: Secondary | ICD-10-CM

## 2012-09-17 DIAGNOSIS — I252 Old myocardial infarction: Secondary | ICD-10-CM | POA: Insufficient documentation

## 2012-09-17 DIAGNOSIS — E785 Hyperlipidemia, unspecified: Secondary | ICD-10-CM | POA: Diagnosis present

## 2012-09-17 DIAGNOSIS — R0600 Dyspnea, unspecified: Secondary | ICD-10-CM

## 2012-09-17 LAB — BASIC METABOLIC PANEL
CO2: 24 mEq/L (ref 19–32)
Calcium: 9.8 mg/dL (ref 8.4–10.5)
Chloride: 100 mEq/L (ref 96–112)
Creatinine, Ser: 1.36 mg/dL — ABNORMAL HIGH (ref 0.50–1.35)
Glucose, Bld: 325 mg/dL — ABNORMAL HIGH (ref 70–99)
Sodium: 137 mEq/L (ref 135–145)

## 2012-09-17 LAB — CBC
Hemoglobin: 13.2 g/dL (ref 13.0–17.0)
MCH: 31.4 pg (ref 26.0–34.0)
MCV: 92.4 fL (ref 78.0–100.0)
Platelets: 388 10*3/uL (ref 150–400)
RBC: 4.2 MIL/uL — ABNORMAL LOW (ref 4.22–5.81)
WBC: 9.5 10*3/uL (ref 4.0–10.5)

## 2012-09-17 LAB — D-DIMER, QUANTITATIVE: D-Dimer, Quant: 1.63 ug/mL-FEU — ABNORMAL HIGH (ref 0.00–0.48)

## 2012-09-17 MED ORDER — ASPIRIN 81 MG PO CHEW
324.0000 mg | CHEWABLE_TABLET | Freq: Once | ORAL | Status: DC
  Filled 2012-09-17: qty 4

## 2012-09-17 NOTE — ED Provider Notes (Addendum)
History     CSN: [id number]  Arrival date & time 09/17/12  2018   First MD Initiated Contact with Patient 09/17/12 2248      Chief Complaint  Patient presents with  . Chest Pain    (Consider location/radiation/quality/duration/timing/severity/associated sxs/prior treatment) HPI HX per PT. Recent L shoulder surgery, since that time having SOB and chest tightness not improving. L shoulder pain improving no longer requiring any narcotics for this. Mild to MOD in severity. Per family has been weak and pale and when given oxygen he felt much better and his color came back. Only mild symptoms at this time, worse with any exertion, h/o MI 10 years ago not the same as these symptoms, no leg pain or swelling ro h/o DVT / PE. Takes plavix Past Medical History  Diagnosis Date  . Coronary artery disease     NSTEMI 2001, 2003   . Hypertension   . Diabetes mellitus     Past Surgical History  Procedure Date  . None     History reviewed. No pertinent family history.  History  Substance Use Topics  . Smoking status: Never Smoker   . Smokeless tobacco: Not on file  . Alcohol Use: Not on file      Review of Systems  Constitutional: Negative for fever and chills.  HENT: Negative for neck pain and neck stiffness.   Eyes: Negative for pain.  Respiratory: Positive for apnea and shortness of breath.   Cardiovascular: Negative for leg swelling.  Gastrointestinal: Negative for abdominal pain.  Genitourinary: Negative for dysuria.  Musculoskeletal: Negative for back pain.  Skin: Negative for rash.  Neurological: Negative for headaches.  All other systems reviewed and are negative.    Allergies  Review of patient's allergies indicates no known allergies.  Home Medications   Current Outpatient Rx  Name Route Sig Dispense Refill  . CLOPIDOGREL BISULFATE 75 MG PO TABS Oral Take 1 tablet (75 mg total) by mouth daily. 90 tablet 3  . GLIMEPIRIDE 4 MG PO TABS Oral Take 8 mg by mouth  daily before breakfast.    . LORATADINE 10 MG PO TABS Oral Take 10 mg by mouth daily.     Marland Kitchen METFORMIN HCL 1000 MG PO TABS Oral Take 1,000-15,000 mg by mouth See admin instructions. Takes 1.5 tablets with breakfast and 1 tablet with supper.    Marland Kitchen METOPROLOL TARTRATE 25 MG PO TABS Oral Take 25 mg by mouth 2 (two) times daily.    Marland Kitchen PRAVASTATIN SODIUM 40 MG PO TABS Oral Take 40 mg by mouth every evening.    Marland Kitchen RAMIPRIL 10 MG PO CAPS Oral Take 10 mg by mouth daily.    . TRAMADOL HCL 50 MG PO TABS Oral Take 50 mg by mouth every 6 (six) hours as needed. For pain      BP 125/70  Pulse 79  Temp 98.2 F (36.8 C) (Oral)  Resp 14  SpO2 99%  Physical Exam  Constitutional: He is oriented to person, place, and time. He appears well-developed and well-nourished.  HENT:  Head: Normocephalic and atraumatic.  Eyes: Conjunctivae normal and EOM are normal. Pupils are equal, round, and reactive to light.  Neck: Trachea normal. Neck supple. No thyromegaly present.  Cardiovascular: Normal rate, regular rhythm, S1 normal, S2 normal and normal pulses.     No systolic murmur is present   No diastolic murmur is present  Pulses:      Radial pulses are 2+ on the right side, and 2+  on the left side.  Pulmonary/Chest: Effort normal and breath sounds normal. He has no wheezes. He has no rhonchi. He has no rales. He exhibits no tenderness.  Abdominal: Soft. Normal appearance and bowel sounds are normal. There is no tenderness. There is no CVA tenderness and negative Murphy's sign.  Musculoskeletal:       BLE:s Calves nontender, no cords or erythema, negative Homans sign  Neurological: He is alert and oriented to person, place, and time. He has normal strength. No cranial nerve deficit or sensory deficit. GCS eye subscore is 4. GCS verbal subscore is 5. GCS motor subscore is 6.  Skin: Skin is warm and dry. No rash noted. He is not diaphoretic.  Psychiatric: His speech is normal.       Cooperative and appropriate     ED Course  Procedures (including critical care time)   Labs Reviewed  CBC  BASIC METABOLIC PANEL   Dg Chest 2 View  09/17/2012  *RADIOLOGY REPORT*  Clinical Data: Chest pain, shortness of breath  CHEST - 2 VIEW  Comparison: None.  Findings: Lungs are clear. No pleural effusion or pneumothorax.  Cardiomediastinal silhouette is within normal limits.  Degenerative changes of the visualized thoracolumbar spine.  IMPRESSION: No evidence of acute cardiopulmonary disease.   Original Report Authenticated By: Charline Bills, M.D.     Troponin 0.01  WBC 9.5, H/H 13/38, BMP WNL x glucose 325, crt 1.36.    CXR as above, no acute abnl   Date: 09/17/2012  Rate: 92  Rhythm: normal sinus rhythm  QRS Axis: left  Intervals: normal  ST/T Wave abnormalities: nonspecific ST changes  Conduction Disutrbances:none  Narrative Interpretation:   Old EKG Reviewed: none available  Oxygen provided, declines ASA for h/o GIB and taking plavix  Recheck- remains symptomatics, labs and CXR reviewed, d-dimer pending, MED c/s for admit  11:45 PM Dr Joneen Roach to admit MDM   Cp and SOB after L shoulder surgery, neg ECG and troponin, d-dimer pending, plan MED admit        Sunnie Nielsen, MD 09/17/12 2345   After decision made to admit, DR Joneen Roach spoke with PT again and now wants to leave AMA. No AMS, is A/O x 4.  D-dimer noted to be elevated and PT agreeable to CT PE study obtained/ resulted neg for PE or acute process.  2:04 AM PT very much wants to go home. Plan d/c home with close PCP follow up and strict return precautions verbalized as understood.   Sunnie Nielsen, MD 09/18/12 [phone]

## 2012-09-17 NOTE — ED Notes (Signed)
Post rotater cuff sx on 9/25, past 2 days experiencing SOB and chest tightness. Denies pain describes it as tightness and feeling like "I can not get a good exchange of air when I breath" denies nausea c/o dizziness. Sats 97% RA.

## 2012-09-17 NOTE — H&P (Signed)
PCP:   [name], MD   Chief Complaint:  Chest uncomfortable  HPI: C/o feeling uncomfortable in chest and anxious. No chest pain. Tachycardic at home. Symptoms started over last week. Patient had a rotator cuff repair was on hydrocodone, then tramadol, both of which had odd SE. Came to ER  Review of Systems:  The patient denies anorexia, fever, weight loss,, vision loss, decreased hearing, hoarseness, chest pain, syncope, dyspnea on exertion, peripheral edema, balance deficits, hemoptysis, abdominal pain, melena, hematochezia, severe indigestion/heartburn, hematuria, incontinence, genital sores, muscle weakness, suspicious skin lesions, transient blindness, difficulty walking, depression, unusual weight change, abnormal bleeding, enlarged lymph nodes, angioedema, and breast masses.  Past Medical History: Past Medical History  Diagnosis Date  . Coronary artery disease     NSTEMI 2001, 2003   . Hypertension   . Diabetes mellitus    Past Surgical History  Procedure Date  . None     Medications: Prior to Admission medications   Medication Sig Start Date End Date Taking? Authorizing Provider  clopidogrel (PLAVIX) 75 MG tablet Take 1 tablet (75 mg total) by mouth daily. 09/30/11 09/29/12 Yes Kathleene Hazel, MD  glimepiride (AMARYL) 4 MG tablet Take 8 mg by mouth daily before breakfast.   Yes Historical Provider, MD  loratadine (CLARITIN) 10 MG tablet Take 10 mg by mouth daily.    Yes Historical Provider, MD  metFORMIN (GLUCOPHAGE) 1000 MG tablet Take 1,000-15,000 mg by mouth See admin instructions. Takes 1.5 tablets with breakfast and 1 tablet with supper.   Yes Historical Provider, MD  metoprolol tartrate (LOPRESSOR) 25 MG tablet Take 25 mg by mouth 2 (two) times daily.   Yes Historical Provider, MD  pravastatin (PRAVACHOL) 40 MG tablet Take 40 mg by mouth every evening.   Yes Historical Provider, MD  ramipril (ALTACE) 10 MG capsule Take 10 mg by mouth daily. 09/30/11  09/29/12 Yes Kathleene Hazel, MD  traMADol (ULTRAM) 50 MG tablet Take 50 mg by mouth every 6 (six) hours as needed. For pain   Yes Historical Provider, MD    Allergies:  No Known Allergies  Social History:  reports that he has never smoked. He does not have any smokeless tobacco history on file. His alcohol and drug histories not on file.  Family History: History reviewed. No pertinent family history.  Physical Exam: Filed Vitals:   09/17/12 2025 09/17/12 2246  BP: 121/57 125/70  Pulse: 97 79  Temp: 97.8 F (36.6 C) 98.2 F (36.8 C)  TempSrc: Oral   Resp: 16 14  SpO2: 97% 99%    General:  Alert and oriented times three, well developed and nourished, no acute distress Eyes: PERRLA, pink conjunctiva, no scleral icterus ENT: Moist oral mucosa, neck supple, no thyromegaly Lungs: clear to ascultation, no wheeze, no crackles, no use of accessory muscles Cardiovascular: regular rate and rhythm, no regurgitation, no gallops, no murmurs. No carotid bruits, no JVD Abdomen: soft, positive BS, non-tender, non-distended, no organomegaly, not an acute abdomen GU: not examined Neuro: CN II - XII grossly intact, sensation intact Musculoskeletal: strength 5/5 all extremities, no clubbing, cyanosis or edema Skin: no rash, no subcutaneous crepitation, no decubitus Psych: appropriate patient   Labs on Admission:  No results found for this basename: NA:2,K:2,CL:2,CO2:2,GLUCOSE:2,BUN:2,CREATININE:2,CALCIUM:2,MG:2,PHOS:2 in the last 72 hours No results found for this basename: AST:2,ALT:2,ALKPHOS:2,BILITOT:2,PROT:2,ALBUMIN:2 in the last 72 hours No results found for this basename: LIPASE:2,AMYLASE:2 in the last 72 hours No results found for this basename: WBC:2,NEUTROABS:2,HGB:2,HCT:2,MCV:2,PLT:2 in the last 72 hours No  results found for this basename: CKTOTAL:3,CKMB:3,CKMBINDEX:3,TROPONINI:3 in the last 72 hours No components found with this basename: POCBNP:3 No results found for this  basename: DDIMER:2 in the last 72 hours No results found for this basename: HGBA1C:2 in the last 72 hours No results found for this basename: CHOL:2,HDL:2,LDLCALC:2,TRIG:2,CHOLHDL:2,LDLDIRECT:2 in the last 72 hours No results found for this basename: TSH,T4TOTAL,FREET3,T3FREE,THYROIDAB in the last 72 hours No results found for this basename: VITAMINB12:2,FOLATE:2,FERRITIN:2,TIBC:2,IRON:2,RETICCTPCT:2 in the last 72 hours  Micro Results: No results found for this or any previous visit (from the past 240 hour(s)).   Radiological Exams on Admission: Dg Chest 2 View  09/17/2012  *RADIOLOGY REPORT*  Clinical Data: Chest pain, shortness of breath  CHEST - 2 VIEW  Comparison: None.  Findings: Lungs are clear. No pleural effusion or pneumothorax.  Cardiomediastinal silhouette is within normal limits.  Degenerative changes of the visualized thoracolumbar spine.  IMPRESSION: No evidence of acute cardiopulmonary disease.   Original Report Authenticated By: Charline Bills, M.D.     EKG: NSR  Assessment/Plan Present on Admission:  .Chest discomfort .CAD, NATIVE VESSEL Spoke with patient who doubts this is cardiac and i agree. He wishes to go home Informed ED physician .HYPERLIPIDEMIA-MIXED .HYPERTENSION, BENIGN Stable   Azayla Polo 09/17/2012, 11:33 PM

## 2012-09-18 ENCOUNTER — Observation Stay (HOSPITAL_COMMUNITY): Payer: Medicare Other

## 2012-09-18 DIAGNOSIS — I2510 Atherosclerotic heart disease of native coronary artery without angina pectoris: Secondary | ICD-10-CM

## 2012-09-18 MED ORDER — IOHEXOL 350 MG/ML SOLN
100.0000 mL | Freq: Once | INTRAVENOUS | Status: AC | PRN
  Administered 2012-09-18: 100 mL via INTRAVENOUS

## 2012-09-18 NOTE — Discharge Summary (Signed)
Patient discharged from the ER. He was never admitted.

## 2012-09-23 ENCOUNTER — Encounter: Payer: Self-pay | Admitting: Cardiovascular Disease

## 2012-09-23 ENCOUNTER — Ambulatory Visit (INDEPENDENT_AMBULATORY_CARE_PROVIDER_SITE_OTHER): Payer: Medicare Other | Admitting: Cardiovascular Disease

## 2012-09-23 VITALS — BP 124/77 | HR 84 | Ht 74.0 in | Wt 216.0 lb

## 2012-09-23 DIAGNOSIS — I10 Essential (primary) hypertension: Secondary | ICD-10-CM

## 2012-09-23 DIAGNOSIS — E785 Hyperlipidemia, unspecified: Secondary | ICD-10-CM

## 2012-09-23 DIAGNOSIS — I2510 Atherosclerotic heart disease of native coronary artery without angina pectoris: Secondary | ICD-10-CM

## 2012-09-23 NOTE — Progress Notes (Signed)
History of Present Illness: 71 yo WM with a history of DM, HTN, HLD and coronary artery disease status post non-ST elevation myocardial infarction in 2001 and 2003.Both times had MI without previous anginal pain. He has been treated with a bare-metal stent to the right coronary artery and Cypher drug-eluting stent to the circumflex. He has normal LV function. He has been followed in the past by Dr. Gala Romney. Stress myoview February 2011 with LVEF of 55%. No ischemia. Seen in ED last week with c/o "funny feeling" in chest. Troponin negative but d-dimer elevated. CTA chest without evidence of PE.   He returns today for f/u. No chest pain or SOB. He thinks he felt poorly because of pain meds last week. He feels much better.   Primary Care Physician: Windle Guard  Last Lipid Profile: 08/12/12: Total chol: 144  HDL: 40  LDL: 72    Past Medical History  Diagnosis Date  . Coronary artery disease     NSTEMI 2001, 2003   . Hypertension   . Diabetes mellitus     Past Surgical History  Procedure Date  . None     Current Outpatient Prescriptions  Medication Sig Dispense Refill  . clopidogrel (PLAVIX) 75 MG tablet Take 1 tablet (75 mg total) by mouth daily.  90 tablet  3  . glimepiride (AMARYL) 4 MG tablet Take 8 mg by mouth daily before breakfast.      . loratadine (CLARITIN) 10 MG tablet Take 10 mg by mouth daily.       . metFORMIN (GLUCOPHAGE) 1000 MG tablet Take 1,000-15,000 mg by mouth See admin instructions. Takes 1.5 tablets with breakfast and 1 tablet with supper.      . metoprolol tartrate (LOPRESSOR) 25 MG tablet Take 25 mg by mouth 2 (two) times daily.      . pravastatin (PRAVACHOL) 40 MG tablet Take 40 mg by mouth every evening.      . ramipril (ALTACE) 10 MG capsule Take 10 mg by mouth daily.      . traMADol (ULTRAM) 50 MG tablet Take 50 mg by mouth every 6 (six) hours as needed. For pain        No Known Allergies  History   Social History  . Marital Status: Married   Spouse Name: N/A    Number of Children: N/A  . Years of Education: N/A   Occupational History  . Not on file.   Social History Main Topics  . Smoking status: Never Smoker   . Smokeless tobacco: Not on file  . Alcohol Use: Not on file  . Drug Use: Not on file  . Sexually Active: Not on file   Other Topics Concern  . Not on file   Social History Narrative  . No narrative on file    No family history on file.  Review of Systems:  As stated in the HPI and otherwise negative.   BP 124/77  Pulse 84  Ht 6\' 2"  (1.88 m)  Wt 216 lb (97.977 kg)  BMI 27.73 kg/m2  Physical Examination: General: Well developed, well nourished, NAD HEENT: OP clear, mucus membranes moist SKIN: warm, dry. No rashes. Neuro: No focal deficits Musculoskeletal: Muscle strength 5/5 all ext Psychiatric: Mood and affect normal Neck: No JVD, no carotid bruits, no thyromegaly, no lymphadenopathy. Lungs:Clear bilaterally, no wheezes, rhonci, crackles Cardiovascular: Regular rate and rhythm. No murmurs, gallops or rubs. Abdomen:Soft. Bowel sounds present. Non-tender.  Extremities: No lower extremity edema. Pulses are 2 + in  the bilateral DP/PT.  Assessment and Plan:   1. CAD: Stable. Continue Plavix, beta blocker, Ace-inh and statin. He is not on an ASA due to GI upset.   2. HYPERTENSION: BP well controlled. No changes.   3. Hyperlipidemia: Well controlled on statin. No changes

## 2012-09-23 NOTE — Patient Instructions (Addendum)
Your physician wants you to follow-up in:  12 months.  You will receive a reminder letter in the mail two months in advance. If you don't receive a letter, please call our office to schedule the follow-up appointment.   

## 2012-10-20 ENCOUNTER — Other Ambulatory Visit: Payer: Self-pay | Admitting: Cardiovascular Disease

## 2012-10-20 NOTE — Telephone Encounter (Signed)
Fax Received. Refill Completed. Timothy [name] (R.M.A)   

## 2013-06-06 ENCOUNTER — Encounter (INDEPENDENT_AMBULATORY_CARE_PROVIDER_SITE_OTHER): Payer: Self-pay | Admitting: Surgery

## 2013-06-06 ENCOUNTER — Encounter (INDEPENDENT_AMBULATORY_CARE_PROVIDER_SITE_OTHER): Payer: Self-pay

## 2013-06-06 ENCOUNTER — Ambulatory Visit (INDEPENDENT_AMBULATORY_CARE_PROVIDER_SITE_OTHER): Payer: Medicare Other | Admitting: Surgery

## 2013-06-06 ENCOUNTER — Telehealth (INDEPENDENT_AMBULATORY_CARE_PROVIDER_SITE_OTHER): Payer: Self-pay

## 2013-06-06 VITALS — BP 110/70 | HR 70 | Temp 97.0°F | Resp 16 | Ht 74.0 in | Wt 231.2 lb

## 2013-06-06 DIAGNOSIS — K4090 Unilateral inguinal hernia, without obstruction or gangrene, not specified as recurrent: Secondary | ICD-10-CM | POA: Insufficient documentation

## 2013-06-06 NOTE — Telephone Encounter (Signed)
Called Dr. Jeannetta Nap office requesting records for patient's appointment today with Dr. Gerrit Friends.  Will fax records they have to [phone].

## 2013-06-06 NOTE — Progress Notes (Signed)
General Surgery - [facility]  Chief Complaint  Patient presents with  . New Evaluation    evaluate for RIH - referral from Dr. Wilson Elkins    HISTORY: Patient is a 72-year-old male referred by his primary care physician for a newly diagnosed right inguinal hernia. This is been present for approximately 6 months and gradually increasing in size. He has had no signs or symptoms of obstruction. He has had minor discomfort. It is always been reducible.   Patient has had no prior hernia repairs. He has had no other abdominal surgery.  Past Medical History  Diagnosis Date  . Coronary artery disease     NSTEMI 2001, 2003   . Hypertension   . Diabetes mellitus   . History of MI (myocardial infarction)   . Hyperlipidemia   . CAD (coronary artery disease)   . Syncope, carotid sinus   . Inguinal hernia      Current Outpatient Prescriptions  Medication Sig Dispense Refill  . glimepiride (AMARYL) 4 MG tablet Take 8 mg by mouth daily before breakfast.      . loratadine (CLARITIN) 10 MG tablet Take 10 mg by mouth daily.       . metFORMIN (GLUCOPHAGE) 1000 MG tablet Take 1,000-15,000 mg by mouth See admin instructions. Takes 1.5 tablets with breakfast and 1 tablet with supper.      . metoprolol tartrate (LOPRESSOR) 25 MG tablet TAKE ONE TABLET BY MOUTH TWICE DAILY  180 tablet  3  . pravastatin (PRAVACHOL) 40 MG tablet TAKE ONE TABLET BY MOUTH IN THE EVENING  90 tablet  3  . ramipril (ALTACE) 10 MG capsule Take 10 mg by mouth daily.       No current facility-administered medications for this visit.     No Known Allergies   Family History  Problem Relation Age of Onset  . Cancer Brother     colon cancer     History   Social History  . Marital Status: Married    Spouse Name: N/A    Number of Children: N/A  . Years of Education: N/A   Social History Main Topics  . Smoking status: Never Smoker   . Smokeless tobacco: None  . Alcohol Use: None  . Drug Use:  None  . Sexually Active: None   Other Topics Concern  . None   Social History Narrative  . None     REVIEW OF SYSTEMS - PERTINENT POSITIVES ONLY: Denies signs or symptoms of obstruction. Minor discomfort. Always reducible.  EXAM: Filed Vitals:   06/06/13 1457  BP: 110/70  Pulse: 70  Temp: 97 F (36.1 C)  Resp: 16    HEENT: normocephalic; pupils equal and reactive; sclerae clear; dentition good; mucous membranes moist NECK:  symmetric on extension; no palpable anterior or posterior cervical lymphadenopathy; no supraclavicular masses; no tenderness CHEST: clear to auscultation bilaterally without rales, rhonchi, or wheezes CARDIAC: regular rate and rhythm without significant murmur; peripheral pulses are full ABDOMEN: soft without distension; bowel sounds present; no mass; no hepatosplenomegaly GU:  Normal male genitalia without mass or lesion; left inguinal canal without evidence of hernia with cough and Valsalva; on the right, there is a large visible bulge which is manually reducible but augments with cough and Valsalva consistent with right inguinal hernia EXT:  non-tender without edema; no deformity NEURO: no gross focal deficits; no sign of tremor   LABORATORY RESULTS: See [facility] (CHL-Epic) for most recent results   RADIOLOGY RESULTS: See   [facility] (CHL-Epic) for most recent results   IMPRESSION: Right inguinal hernia, reducible  PLAN: Patient and I discussed repair of his right inguinal hernia with mesh. I provided him with written literature to review. We discussed the risk of recurrence. We discussed alternative methods of repair including laparoscopic repair. I have recommended right inguinal hernia repair by open technique with mesh. This has the lowest recurrence rate.  We discussed the procedure, the postoperative recovery, and his return to activity. Patient understands and would like to proceed with surgery in the near future.  We will  request clearance from his cardiologist.  The risks and benefits of the procedure have been discussed at length with the patient.  The patient understands the proposed procedure, potential alternative treatments, and the course of recovery to be expected.  All of the patient's questions have been answered at this time.  The patient wishes to proceed with surgery.  Rewa Weissberg M. Riddick Nuon, MD, FACS General & Endocrine Surgery [facility]   Visit Diagnoses: 1. Inguinal hernia unilateral, non-recurrent, right     Primary Care Physician: ELKINS,WILSON OLIVER, MD   

## 2013-06-06 NOTE — Patient Instructions (Signed)
[facility]  HERNIA REPAIR POST OP INSTRUCTIONS  Always review your discharge instruction sheet given to you by the facility where your surgery was performed.  1. A  prescription for pain medication may be given to you upon discharge.  Take your pain medication as prescribed.  If narcotic pain medicine is not needed, then you may take acetaminophen (Tylenol) or ibuprofen (Advil) as needed.  2. Take your usually prescribed medications unless otherwise directed.  3. If you need a refill on your pain medication, please contact your pharmacy.  They will contact our office to request authorization. Prescriptions will not be filled after 5 pm daily or on weekends.  4. You should follow a light diet the first 24 hours after arrival home, such as soup and crackers or toast.  Be sure to include plenty of fluids daily.  Resume your normal diet the day after surgery.  5. Most patients will experience some swelling and bruising around the surgical site.  Ice packs and reclining will help.  Swelling and bruising can take several days to resolve.   6. It is common to experience some constipation if taking pain medication after surgery.  Increasing fluid intake and taking a stool softener (such as Colace) will usually help or prevent this problem from occurring.  A mild laxative (Milk of Magnesia or Miralax) should be taken according to package directions if there are no bowel movements after 48 hours.  7. Unless discharge instructions indicate otherwise, you may remove your bandages 24-48 hours after surgery, and you may shower at that time.  You may have steri-strips (small skin tapes) in place directly over the incision.  These strips should be left on the skin for 7-10 days.  If your surgeon used skin glue on the incision, you may shower in 24 hours.  The glue will flake off over the next 2-3 weeks.  Any sutures or staples will be removed at the office during your follow-up  visit.  8. ACTIVITIES:  You may resume regular (light) daily activities beginning the next day-such as daily self-care, walking, climbing stairs-gradually increasing activities as tolerated.  You may have sexual intercourse when it is comfortable.  Refrain from any heavy lifting or straining until approved by your doctor.  You may drive when you are no longer taking prescription pain medication, you can comfortably wear a seatbelt, and you can safely maneuver your car and apply brakes.  9. You should see your doctor in the office for a follow-up appointment approximately 2-3 weeks after your surgery.  Make sure that you call for this appointment within a day or two after you arrive home to insure a convenient appointment time. 10.   WHEN TO CALL YOUR DOCTOR: 1. Fever greater than 101.0 2. Inability to urinate 3. Persistent nausea and/or vomiting 4. Extreme swelling or bruising 5. Continued bleeding from incision 6. Increased pain, redness, or drainage from the incision  The clinic staff is available to answer your questions during regular business hours.  Please don't hesitate to call and ask to speak to one of the nurses for clinical concerns.  If you have a medical emergency, go to the nearest emergency room or call 911.  A surgeon from [facility] is always on call for the hospital.   [facility] [address], [address], [place], [place]  [address]  [phone] ? [phone] ? FAX [phone]  [email or web address]   

## 2013-06-07 ENCOUNTER — Telehealth: Payer: Self-pay | Admitting: *Deleted

## 2013-06-07 NOTE — Telephone Encounter (Signed)
Dr. Clifton James received request for clearance for pt to have hernia repair. Per Dr.McAlhany pt needs office visit for surgical clearance

## 2013-06-07 NOTE — Telephone Encounter (Signed)
Left message to call back.  Pt can be added onto Dr. Gibson Ramp schedule on July 7 or 10 at 8:30.

## 2013-06-08 NOTE — Telephone Encounter (Deleted)
error 

## 2013-06-12 ENCOUNTER — Encounter: Payer: Self-pay | Admitting: Cardiovascular Disease

## 2013-06-12 ENCOUNTER — Telehealth: Payer: Self-pay | Admitting: Cardiovascular Disease

## 2013-06-12 ENCOUNTER — Ambulatory Visit (INDEPENDENT_AMBULATORY_CARE_PROVIDER_SITE_OTHER): Payer: Medicare Other | Admitting: Cardiovascular Disease

## 2013-06-12 VITALS — BP 113/69 | HR 70 | Ht 74.0 in | Wt 228.4 lb

## 2013-06-12 DIAGNOSIS — I2510 Atherosclerotic heart disease of native coronary artery without angina pectoris: Secondary | ICD-10-CM

## 2013-06-12 DIAGNOSIS — Z01810 Encounter for preprocedural cardiovascular examination: Secondary | ICD-10-CM

## 2013-06-12 DIAGNOSIS — I10 Essential (primary) hypertension: Secondary | ICD-10-CM

## 2013-06-12 NOTE — Progress Notes (Signed)
History of Present Illness: 72 yo WM with a history of DM, HTN, HLD and coronary artery disease status post non-ST elevation myocardial infarction in 2001 and 2003.Both times had MI without previous anginal pain. He has been treated with a bare-metal stent to the right coronary artery and Cypher drug-eluting stent to the circumflex. He has normal LV function. Stress myoview February 2011 with LVEF of 55%. No ischemia.    He returns today for f/u. No chest pain or SOB. He has plans for hernia repair in the near future. He has been active working in the yard without limitation and actually built a treehouse for his granddaughter this weekend.   Primary Care Physician: Windle Guard  Last Lipid Profile: Followed in primary care.    Past Medical History  Diagnosis Date  . Coronary artery disease     NSTEMI 2001, 2003   . Hypertension   . Diabetes mellitus   . History of MI (myocardial infarction)   . Hyperlipidemia   . CAD (coronary artery disease)   . Syncope, carotid sinus   . Inguinal hernia     Past Surgical History  Procedure Laterality Date  . None    . Eye surgery  2013    Cataracts  . Rotator cuff repair  2013    Current Outpatient Prescriptions  Medication Sig Dispense Refill  . glimepiride (AMARYL) 4 MG tablet Take 8 mg by mouth daily before breakfast.      . loratadine (CLARITIN) 10 MG tablet Take 10 mg by mouth daily.       . metFORMIN (GLUCOPHAGE) 1000 MG tablet Take 1,000-15,000 mg by mouth See admin instructions. Takes 1.5 tablets with breakfast and 1 tablet with supper.      . metoprolol tartrate (LOPRESSOR) 25 MG tablet TAKE ONE TABLET BY MOUTH TWICE DAILY  180 tablet  3  . pravastatin (PRAVACHOL) 40 MG tablet TAKE ONE TABLET BY MOUTH IN THE EVENING  90 tablet  3  . ramipril (ALTACE) 10 MG capsule Take 10 mg by mouth daily.       No current facility-administered medications for this visit.    No Known Allergies  History   Social History  . Marital  Status: Married    Spouse Name: N/A    Number of Children: N/A  . Years of Education: N/A   Occupational History  . Not on file.   Social History Main Topics  . Smoking status: Never Smoker   . Smokeless tobacco: Not on file  . Alcohol Use: Not on file  . Drug Use: Not on file  . Sexually Active: Not on file   Other Topics Concern  . Not on file   Social History Narrative  . No narrative on file    Family History  Problem Relation Age of Onset  . Cancer Brother     colon cancer    Review of Systems:  As stated in the HPI and otherwise negative.   BP 113/69  Pulse 70  Ht 6\' 2"  (1.88 m)  Wt 228 lb 6.4 oz (103.602 kg)  BMI 29.31 kg/m2  Physical Examination: General: Well developed, well nourished, NAD HEENT: OP clear, mucus membranes moist SKIN: warm, dry. No rashes. Neuro: No focal deficits Musculoskeletal: Muscle strength 5/5 all ext Psychiatric: Mood and affect normal Neck: No JVD, no carotid bruits, no thyromegaly, no lymphadenopathy. Lungs:Clear bilaterally, no wheezes, rhonci, crackles Cardiovascular: Regular rate and rhythm. No murmurs, gallops or rubs. Abdomen:Soft. Bowel sounds present. Non-tender.  Extremities: No lower extremity edema. Pulses are 2 + in the bilateral DP/PT.  EKG: NSR, rate 68 bpm. LAD. Possible septal infarct.   Assessment and Plan:   1. CAD: Stable. Continue Plavix, beta blocker, Ace-inh and statin. He is not on an ASA due to GI upset. Will hold Plavix one week prior to surgical procedure and resume when safe afterwards.   2. HYPERTENSION: BP well controlled. No changes.   3. Hyperlipidemia: He is on a statin. No changes  4. Pre-operative cardiovascular examination: He has no angina, CHF or syncope. OK to hold Plavix for surgical procedure. No further ischemic workup needed before his surgery.

## 2013-06-12 NOTE — Patient Instructions (Addendum)
Your physician wants you to follow-up in:  6 months. You will receive a reminder letter in the mail two months in advance. If you don't receive a letter, please call our office to schedule the follow-up appointment.   

## 2013-06-12 NOTE — Telephone Encounter (Signed)
Appt June 12, 2013 at 8:30

## 2013-06-12 NOTE — Telephone Encounter (Signed)
Medical Clearance Faxed to [facility] at [phone] 06/12/13/KM

## 2013-06-13 ENCOUNTER — Telehealth (INDEPENDENT_AMBULATORY_CARE_PROVIDER_SITE_OTHER): Payer: Self-pay

## 2013-06-13 NOTE — Telephone Encounter (Signed)
Per Dr Ardine Eng request pt advised we received note from Dr Clifton James pt may d/c plavix one week prior to surgery. Pt states he understands direction.

## 2013-06-22 ENCOUNTER — Encounter (HOSPITAL_BASED_OUTPATIENT_CLINIC_OR_DEPARTMENT_OTHER): Payer: Self-pay | Admitting: *Deleted

## 2013-06-22 ENCOUNTER — Encounter (HOSPITAL_BASED_OUTPATIENT_CLINIC_OR_DEPARTMENT_OTHER)
Admission: RE | Admit: 2013-06-22 | Discharge: 2013-06-22 | Disposition: A | Discharge status: Home / Self Care | Payer: Medicare Other | Source: Ambulatory Visit | Attending: Surgery | Admitting: Surgery

## 2013-06-22 LAB — BASIC METABOLIC PANEL
BUN: 20 mg/dL (ref 6–23)
GFR calc non Af Amer: 56 mL/min — ABNORMAL LOW (ref >90)
Glucose, Bld: 148 mg/dL — ABNORMAL HIGH (ref 70–99)
Potassium: 4.3 mEq/L (ref 3.5–5.1)

## 2013-06-22 NOTE — Progress Notes (Signed)
To come in for bmet-had ekg 7/14 dr Sanjuana Kava cleared pt for surgery

## 2013-06-22 NOTE — Progress Notes (Signed)
Quick Note:  These results are acceptable for scheduled surgery.  Antoria Lanza M. Yessica Putnam, MD, FACS [facility] Office: [phone]   ______ 

## 2013-06-29 ENCOUNTER — Encounter (HOSPITAL_BASED_OUTPATIENT_CLINIC_OR_DEPARTMENT_OTHER): Payer: Self-pay | Admitting: *Deleted

## 2013-06-29 ENCOUNTER — Ambulatory Visit (HOSPITAL_BASED_OUTPATIENT_CLINIC_OR_DEPARTMENT_OTHER): Payer: Medicare Other | Admitting: Anesthesiology

## 2013-06-29 ENCOUNTER — Encounter (HOSPITAL_BASED_OUTPATIENT_CLINIC_OR_DEPARTMENT_OTHER): Payer: Self-pay | Admitting: Anesthesiology

## 2013-06-29 ENCOUNTER — Ambulatory Visit (HOSPITAL_BASED_OUTPATIENT_CLINIC_OR_DEPARTMENT_OTHER)
Admission: RE | Admit: 2013-06-29 | Discharge: 2013-06-29 | Disposition: A | Discharge status: Home / Self Care | Payer: Medicare Other | Source: Ambulatory Visit | Attending: Surgery | Admitting: Surgery

## 2013-06-29 ENCOUNTER — Encounter (HOSPITAL_BASED_OUTPATIENT_CLINIC_OR_DEPARTMENT_OTHER)
Admission: RE | Disposition: A | Discharge status: Home / Self Care | Payer: Self-pay | Source: Ambulatory Visit | Attending: Surgery

## 2013-06-29 DIAGNOSIS — I252 Old myocardial infarction: Secondary | ICD-10-CM | POA: Insufficient documentation

## 2013-06-29 DIAGNOSIS — Z79899 Other long term (current) drug therapy: Secondary | ICD-10-CM | POA: Insufficient documentation

## 2013-06-29 DIAGNOSIS — E119 Type 2 diabetes mellitus without complications: Secondary | ICD-10-CM | POA: Insufficient documentation

## 2013-06-29 DIAGNOSIS — K4090 Unilateral inguinal hernia, without obstruction or gangrene, not specified as recurrent: Secondary | ICD-10-CM

## 2013-06-29 DIAGNOSIS — I2510 Atherosclerotic heart disease of native coronary artery without angina pectoris: Secondary | ICD-10-CM | POA: Insufficient documentation

## 2013-06-29 DIAGNOSIS — E785 Hyperlipidemia, unspecified: Secondary | ICD-10-CM | POA: Insufficient documentation

## 2013-06-29 DIAGNOSIS — I10 Essential (primary) hypertension: Secondary | ICD-10-CM | POA: Insufficient documentation

## 2013-06-29 HISTORY — PX: INGUINAL HERNIA REPAIR: SHX194

## 2013-06-29 HISTORY — PX: INSERTION OF MESH: SHX5868

## 2013-06-29 LAB — POCT HEMOGLOBIN-HEMACUE: Hemoglobin: 13.8 g/dL (ref 13.0–17.0)

## 2013-06-29 SURGERY — REPAIR, HERNIA, INGUINAL, ADULT
Anesthesia: Regional | Site: Groin | Laterality: Right | Wound class: Clean

## 2013-06-29 MED ORDER — ONDANSETRON HCL 4 MG/2ML IJ SOLN: 4.0000 mg | Freq: Once | INTRAMUSCULAR | Status: DC | PRN

## 2013-06-29 MED ORDER — OXYCODONE HCL 5 MG/5ML PO SOLN: 5.0000 mg | Freq: Once | ORAL | Status: DC | PRN

## 2013-06-29 MED ORDER — BUPIVACAINE-EPINEPHRINE PF 0.5-1:200000 % IJ SOLN
INTRAMUSCULAR | Status: DC | PRN
  Administered 2013-06-29: 26 mL

## 2013-06-29 MED ORDER — DEXAMETHASONE SODIUM PHOSPHATE 4 MG/ML IJ SOLN
INTRAMUSCULAR | Status: DC | PRN
  Administered 2013-06-29: 4 mg via INTRAVENOUS

## 2013-06-29 MED ORDER — HYDROCODONE-ACETAMINOPHEN 5-325 MG PO TABS: 1.0000 | ORAL_TABLET | ORAL | Status: DC | PRN

## 2013-06-29 MED ORDER — ONDANSETRON HCL 4 MG/2ML IJ SOLN
INTRAMUSCULAR | Status: DC | PRN
  Administered 2013-06-29: 4 mg via INTRAVENOUS

## 2013-06-29 MED ORDER — LACTATED RINGERS IV SOLN
INTRAVENOUS | Status: DC
  Administered 2013-06-29 (×2): via INTRAVENOUS

## 2013-06-29 MED ORDER — FENTANYL CITRATE 0.05 MG/ML IJ SOLN
INTRAMUSCULAR | Status: DC | PRN
  Administered 2013-06-29: 25 ug via INTRAVENOUS

## 2013-06-29 MED ORDER — BUPIVACAINE HCL (PF) 0.5 % IJ SOLN
INTRAMUSCULAR | Status: DC | PRN
  Administered 2013-06-29: 20 mL

## 2013-06-29 MED ORDER — HYDROMORPHONE HCL PF 1 MG/ML IJ SOLN: 0.2500 mg | INTRAMUSCULAR | Status: DC | PRN

## 2013-06-29 MED ORDER — LIDOCAINE HCL (CARDIAC) 20 MG/ML IV SOLN
INTRAVENOUS | Status: DC | PRN
  Administered 2013-06-29: 100 mg via INTRAVENOUS

## 2013-06-29 MED ORDER — PROPOFOL 10 MG/ML IV BOLUS
INTRAVENOUS | Status: DC | PRN
  Administered 2013-06-29: 200 mg via INTRAVENOUS

## 2013-06-29 MED ORDER — CEFAZOLIN SODIUM-DEXTROSE 2-3 GM-% IV SOLR
2.0000 g | INTRAVENOUS | Status: AC
  Administered 2013-06-29: 2 g via INTRAVENOUS

## 2013-06-29 MED ORDER — FENTANYL CITRATE 0.05 MG/ML IJ SOLN
50.0000 ug | INTRAMUSCULAR | Status: DC | PRN
  Administered 2013-06-29: 100 ug via INTRAVENOUS

## 2013-06-29 MED ORDER — OXYCODONE HCL 5 MG PO TABS: 5.0000 mg | ORAL_TABLET | Freq: Once | ORAL | Status: DC | PRN

## 2013-06-29 MED ORDER — EPHEDRINE SULFATE 50 MG/ML IJ SOLN
INTRAMUSCULAR | Status: DC | PRN
  Administered 2013-06-29: 5 mg via INTRAVENOUS
  Administered 2013-06-29: 10 mg via INTRAVENOUS
  Administered 2013-06-29: 5 mg via INTRAVENOUS

## 2013-06-29 MED ORDER — MIDAZOLAM HCL 2 MG/2ML IJ SOLN
1.0000 mg | INTRAMUSCULAR | Status: DC | PRN
  Administered 2013-06-29: 2 mg via INTRAVENOUS

## 2013-06-29 SURGICAL SUPPLY — 42 items
BENZOIN TINCTURE PRP APPL 2/3 (GAUZE/BANDAGES/DRESSINGS) ×2 IMPLANT
BLADE SURG 15 STRL LF DISP TIS (BLADE) ×1 IMPLANT
BLADE SURG 15 STRL SS (BLADE) ×1
BLADE SURG ROTATE 9660 (MISCELLANEOUS) ×2 IMPLANT
CANISTER SUCTION 1200CC (MISCELLANEOUS) IMPLANT
CHLORAPREP W/TINT 26ML (MISCELLANEOUS) ×2 IMPLANT
CLEANER CAUTERY TIP 5X5 PAD (MISCELLANEOUS) ×1 IMPLANT
CLOTH BEACON ORANGE TIMEOUT ST (SAFETY) ×2 IMPLANT
COVER MAYO STAND STRL (DRAPES) ×2 IMPLANT
COVER TABLE BACK 60X90 (DRAPES) ×2 IMPLANT
DECANTER SPIKE VIAL GLASS SM (MISCELLANEOUS) IMPLANT
DRAIN PENROSE 1/2X12 LTX STRL (WOUND CARE) ×2 IMPLANT
DRAPE PED LAPAROTOMY (DRAPES) ×2 IMPLANT
DRAPE UTILITY XL STRL (DRAPES) ×2 IMPLANT
ELECT REM PT RETURN 9FT ADLT (ELECTROSURGICAL) ×2
ELECTRODE REM PT RTRN 9FT ADLT (ELECTROSURGICAL) ×1 IMPLANT
GAUZE SPONGE 4X4 12PLY STRL LF (GAUZE/BANDAGES/DRESSINGS) IMPLANT
GLOVE BIOGEL PI IND STRL 6.5 (GLOVE) ×1 IMPLANT
GLOVE BIOGEL PI INDICATOR 6.5 (GLOVE) ×1
GLOVE ECLIPSE 6.5 STRL STRAW (GLOVE) ×2 IMPLANT
GLOVE SURG ORTHO 8.0 STRL STRW (GLOVE) ×2 IMPLANT
GOWN PREVENTION PLUS XLARGE (GOWN DISPOSABLE) ×2 IMPLANT
GOWN PREVENTION PLUS XXLARGE (GOWN DISPOSABLE) ×2 IMPLANT
MESH ULTRAPRO 3X6 7.6X15CM (Mesh General) ×2 IMPLANT
NEEDLE HYPO 25X1 1.5 SAFETY (NEEDLE) ×2 IMPLANT
NS IRRIG 1000ML POUR BTL (IV SOLUTION) ×2 IMPLANT
PACK BASIN DAY SURGERY FS (CUSTOM PROCEDURE TRAY) ×2 IMPLANT
PAD CLEANER CAUTERY TIP 5X5 (MISCELLANEOUS) ×1
PENCIL BUTTON HOLSTER BLD 10FT (ELECTRODE) ×2 IMPLANT
SLEEVE SCD COMPRESS KNEE MED (MISCELLANEOUS) ×2 IMPLANT
STRIP CLOSURE SKIN 1/2X4 (GAUZE/BANDAGES/DRESSINGS) ×2 IMPLANT
SUT MNCRL AB 4-0 PS2 18 (SUTURE) ×2 IMPLANT
SUT NOVA NAB GS-22 2 0 T19 (SUTURE) ×4 IMPLANT
SUT SILK 2 0 SH (SUTURE) ×2 IMPLANT
SUT SILK 2 0 TIES 17X18 (SUTURE)
SUT SILK 2-0 18XBRD TIE BLK (SUTURE) IMPLANT
SUT VICRYL 3-0 CR8 SH (SUTURE) ×2 IMPLANT
SYR CONTROL 10ML LL (SYRINGE) ×2 IMPLANT
TOWEL OR 17X24 6PK STRL BLUE (TOWEL DISPOSABLE) ×2 IMPLANT
TOWEL OR NON WOVEN STRL DISP B (DISPOSABLE) ×2 IMPLANT
TUBE CONNECTING 20X1/4 (TUBING) IMPLANT
YANKAUER SUCT BULB TIP NO VENT (SUCTIONS) IMPLANT

## 2013-06-29 NOTE — Op Note (Signed)
Inguinal Hernia, Open, Procedure Note  Pre-operative Diagnosis:  Right inguinal hernia, reducible  Post-operative Diagnosis: same  Surgeon:  [name], MD, FACS  Anesthesia:  General  Indications: The patient presented with a right, reducible hernia.    Procedure Details  The patient was seen again in the Holding Room. The risks, benefits, complications, treatment options, and expected outcomes were discussed with the patient.  There was concurrence with the proposed plan, and informed consent was obtained. The site of surgery was properly noted/marked. The patient was taken to the Operating Room, identified by name, and the procedure verified as hernia repair. A Time Out was held and the above information confirmed.  The patient was placed in the supine position and underwent induction of anesthesia.  The lower abdomen and groin was prepped and draped in the usual strict aseptic fashion.  After ascertaining that an adequate level of anesthesia had been obtained, and incision is made in the groin with a #10 blade.  Dissection is carried through the subcutaneous tissues and hemostasis obtained with the electrocautery.  A Gelpi retractor is placed for exposure.  The external oblique fascia is incised in line with it's fibers and extended through the external inguinal ring.  The cord structures are dissected out of the inguinal canal and encircled with a Penrose drain.  The floor of the inguinal canal is dissected out.  The cord is explored and no evidence of indirect hernia is identified.  There is a large direct inguinal hernia which is dissected out and reduced.  It is held in reduction with interrupted 2-0 silk sutures.  The floor of the inguinal canal is reconstructed with a sheet of Ethicon Ultrapro mesh cut to the appropriate dimensions.  It is secured to the pubic tubercle with a 2-0 Novafil suture and along the inguinal ligament with a running 2-0 Novafil suture.  Mesh is split to  accommodate the cord structures.  The superior edge of the mesh is secured to the transversalis and internal oblique muscles with interrupted 2-0 Novafil sutures.  The tails of the mesh are overlapped lateral to the cord structures and secured to the inguinal ligament with interrupted 2-0 Novafil sutures to recreate the internal inguinal ring.  Cord structures are returned to the inguinal canal.  Local anesthetic is infiltrated throughout the field.  External oblique fascia is closed with interrupted 3-0 Vicryl sutures.  Subcutaneous tissues are closed with interrupted 3-0 Vicryl sutures.  Skin is anesthetized with local anesthetic, and the skin edges re-approximated with a running 4-0 Monocryl suture.  Wound is washed and dried and benzoin and steristrips are applied.  A gauze dressing is then applied.  Instrument, sponge, and needle counts were correct prior to closure and at the conclusion of the case.  [name], MD, FACS General & Endocrine Surgery [facility]   Findings: Hernia as above  Estimated Blood Loss: Minimal         Specimens: None  Complications: None; patient tolerated the procedure well.         Disposition: PACU - hemodynamically stable.         Condition: stable

## 2013-06-29 NOTE — Interval H&P Note (Signed)
History and Physical Interval Note:  06/29/2013 8:19 AM  Timothy [name]  has presented today for surgery, with the diagnosis of right inguinal hernia with mesh. The various methods of treatment have been discussed with the patient and family. After consideration of risks, benefits and other options for treatment, the patient has consented to    Procedure(s): HERNIA REPAIR INGUINAL ADULT (Right) INSERTION OF MESH (Right) as a surgical intervention .    The patient's history has been reviewed, patient examined, no change in status, stable for surgery.  I have reviewed the patient's chart and labs.  Questions were answered to the patient's satisfaction.    [name], MD, [facility] Office: [phone]    Timothy [name] Judie Petit

## 2013-06-29 NOTE — Progress Notes (Signed)
Assisted Dr. Crews with right, ultrasound guided, transabdominal plane block. Side rails up, monitors on throughout procedure. See vital signs in flow sheet. Tolerated Procedure well. 

## 2013-06-29 NOTE — Anesthesia Preprocedure Evaluation (Signed)
Anesthesia Evaluation  Patient identified by MRN, date of birth, ID band Patient awake    Reviewed: Allergy & Precautions, H&P , NPO status , Patient's Chart, lab work & pertinent test results  Airway Mallampati: I TM Distance: >3 FB Neck ROM: Full    Dental  (+) Teeth Intact and Dental Advisory Given   Pulmonary  breath sounds clear to auscultation        Cardiovascular hypertension, Pt. on medications and Pt. on home beta blockers + CAD Rhythm:Regular Rate:Normal     Neuro/Psych    GI/Hepatic   Endo/Other  diabetes, Well Controlled, Type 2, Oral Hypoglycemic Agents  Renal/GU      Musculoskeletal   Abdominal   Peds  Hematology   Anesthesia Other Findings   Reproductive/Obstetrics                           Anesthesia Physical Anesthesia Plan  ASA: III  Anesthesia Plan: General   Post-op Pain Management:    Induction: Intravenous  Airway Management Planned: LMA  Additional Equipment:   Intra-op Plan:   Post-operative Plan: Extubation in OR  Informed Consent: I have reviewed the patients History and Physical, chart, labs and discussed the procedure including the risks, benefits and alternatives for the proposed anesthesia with the patient or authorized representative who has indicated his/her understanding and acceptance.   Dental advisory given  Plan Discussed with: Anesthesiologist, Surgeon and CRNA  Anesthesia Plan Comments:         Anesthesia Quick Evaluation

## 2013-06-29 NOTE — Transfer of Care (Signed)
Immediate Anesthesia Transfer of Care Note  Patient: Timothy [name]  Procedure(s) Performed: Procedure(s): HERNIA REPAIR INGUINAL ADULT (Right) INSERTION OF MESH (Right)  Patient Location: PACU  Anesthesia Type:GA combined with regional for post-op pain  Level of Consciousness: awake, patient cooperative and responds to stimulation  Airway & Oxygen Therapy: Patient Spontanous Breathing and Patient connected to face mask oxygen  Post-op Assessment: Report given to PACU RN, Post -op Vital signs reviewed and stable and Patient moving all extremities  Post vital signs: Reviewed and stable  Complications: No apparent anesthesia complications

## 2013-06-29 NOTE — Anesthesia Procedure Notes (Addendum)
Anesthesia Regional Block:  TAP block  Pre-Anesthetic Checklist: ,, timeout performed, Correct Patient, Correct Site, Correct Laterality, Correct Procedure, Correct Position, site marked, Risks and benefits discussed,  Surgical consent,  Pre-op evaluation,  At surgeon's request and post-op pain management  Laterality: Right and Upper  Prep: chloraprep       Needles:  Injection technique: Single-shot  Needle Type: Echogenic Needle     Needle Length: 9cm  Needle Gauge: 21    Additional Needles:  Procedures: ultrasound guided (picture in chart) TAP block Narrative:  Start time: 06/29/2013 8:00 AM End time: 06/29/2013 8:08 AM Injection made incrementally with aspirations every 5 mL.  Performed by: Personally  Anesthesiologist: Sheldon Silvan, MD  TAP block Procedure Name: LMA Insertion Date/Time: 06/29/2013 8:36 AM Performed by: Meyer Russel Pre-anesthesia Checklist: Patient identified, Emergency Drugs available, Suction available and Patient being monitored Patient Re-evaluated:Patient Re-evaluated prior to inductionOxygen Delivery Method: Circle System Utilized Preoxygenation: Pre-oxygenation with 100% oxygen Intubation Type: IV induction Ventilation: Mask ventilation without difficulty LMA: LMA inserted LMA Size: 5.0 Number of attempts: 1 Airway Equipment and Method: bite block Placement Confirmation: positive ETCO2 and breath sounds checked- equal and bilateral Tube secured with: Tape Dental Injury: Teeth and Oropharynx as per pre-operative assessment     Anesthesia Procedure Note

## 2013-06-29 NOTE — Anesthesia Postprocedure Evaluation (Signed)
  Anesthesia Post-op Note  Patient: Timothy [name]  Procedure(s) Performed: Procedure(s): HERNIA REPAIR INGUINAL ADULT (Right) INSERTION OF MESH (Right)  Patient Location: PACU  Anesthesia Type:General and GA combined with regional for post-op pain  Level of Consciousness: awake, alert  and oriented  Airway and Oxygen Therapy: Patient Spontanous Breathing  Post-op Pain: mild  Post-op Assessment: Post-op Vital signs reviewed  Post-op Vital Signs: Reviewed  Complications: No apparent anesthesia complications

## 2013-06-29 NOTE — H&P (View-Only) (Signed)
General Surgery [facility]  Chief Complaint  Patient presents with  . New Evaluation    evaluate for [facility] - referral from Dr. Windle Guard    HISTORY: Patient is a 72 year old male referred by his primary care physician for a newly diagnosed right inguinal hernia. This is been present for approximately 6 months and gradually increasing in size. He has had no signs or symptoms of obstruction. He has had minor discomfort. It is always been reducible.   Patient has had no prior hernia repairs. He has had no other abdominal surgery.  Past Medical History  Diagnosis Date  . Coronary artery disease     NSTEMI 2001, 2003   . Hypertension   . Diabetes mellitus   . History of MI (myocardial infarction)   . Hyperlipidemia   . CAD (coronary artery disease)   . Syncope, carotid sinus   . Inguinal hernia      Current Outpatient Prescriptions  Medication Sig Dispense Refill  . glimepiride (AMARYL) 4 MG tablet Take 8 mg by mouth daily before breakfast.      . loratadine (CLARITIN) 10 MG tablet Take 10 mg by mouth daily.       . metFORMIN (GLUCOPHAGE) 1000 MG tablet Take 1,000-15,000 mg by mouth See admin instructions. Takes 1.5 tablets with breakfast and 1 tablet with supper.      . metoprolol tartrate (LOPRESSOR) 25 MG tablet TAKE ONE TABLET BY MOUTH TWICE DAILY  180 tablet  3  . pravastatin (PRAVACHOL) 40 MG tablet TAKE ONE TABLET BY MOUTH IN THE EVENING  90 tablet  3  . ramipril (ALTACE) 10 MG capsule Take 10 mg by mouth daily.       No current facility-administered medications for this visit.     No Known Allergies   Family History  Problem Relation Age of Onset  . Cancer Brother     colon cancer     History   Social History  . Marital Status: Married    Spouse Name: N/A    Number of Children: N/A  . Years of Education: N/A   Social History Main Topics  . Smoking status: Never Smoker   . Smokeless tobacco: None  . Alcohol Use: None  . Drug Use:  None  . Sexually Active: None   Other Topics Concern  . None   Social History Narrative  . None     REVIEW OF SYSTEMS - PERTINENT POSITIVES ONLY: Denies signs or symptoms of obstruction. Minor discomfort. Always reducible.  EXAM: Filed Vitals:   06/06/13 1457  BP: 110/70  Pulse: 70  Temp: 97 F (36.1 C)  Resp: 16    HEENT: normocephalic; pupils equal and reactive; sclerae clear; dentition good; mucous membranes moist NECK:  symmetric on extension; no palpable anterior or posterior cervical lymphadenopathy; no supraclavicular masses; no tenderness CHEST: clear to auscultation bilaterally without rales, rhonchi, or wheezes CARDIAC: regular rate and rhythm without significant murmur; peripheral pulses are full ABDOMEN: soft without distension; bowel sounds present; no mass; no hepatosplenomegaly GU:  Normal male genitalia without mass or lesion; left inguinal canal without evidence of hernia with cough and Valsalva; on the right, there is a large visible bulge which is manually reducible but augments with cough and Valsalva consistent with right inguinal hernia EXT:  non-tender without edema; no deformity NEURO: no gross focal deficits; no sign of tremor   LABORATORY RESULTS: See [facility] (CHL-Epic) for most recent results   RADIOLOGY RESULTS: See  [facility] (CHL-Epic) for most recent results   IMPRESSION: Right inguinal hernia, reducible  PLAN: Patient and I discussed repair of his right inguinal hernia with mesh. I provided him with written literature to review. We discussed the risk of recurrence. We discussed alternative methods of repair including laparoscopic repair. I have recommended right inguinal hernia repair by open technique with mesh. This has the lowest recurrence rate.  We discussed the procedure, the postoperative recovery, and his return to activity. Patient understands and would like to proceed with surgery in the near future.  We will  request clearance from his cardiologist.  The risks and benefits of the procedure have been discussed at length with the patient.  The patient understands the proposed procedure, potential alternative treatments, and the course of recovery to be expected.  All of the patient's questions have been answered at this time.  The patient wishes to proceed with surgery.  [name], MD, FACS General & Endocrine Surgery [facility]   Visit Diagnoses: 1. Inguinal hernia unilateral, non-recurrent, right     Primary Care Physician: [name], MD

## 2013-06-30 ENCOUNTER — Encounter (HOSPITAL_BASED_OUTPATIENT_CLINIC_OR_DEPARTMENT_OTHER): Payer: Self-pay | Admitting: Surgery

## 2013-07-01 ENCOUNTER — Emergency Department (HOSPITAL_COMMUNITY): Payer: Medicare Other

## 2013-07-01 ENCOUNTER — Encounter (HOSPITAL_COMMUNITY): Payer: Self-pay | Admitting: Emergency Medicine

## 2013-07-01 ENCOUNTER — Inpatient Hospital Stay (HOSPITAL_COMMUNITY)
Admission: EM | Admit: 2013-07-01 | Discharge: 2013-07-13 | DRG: 329 | Disposition: A | Discharge status: Home / Self Care | Payer: Medicare Other | Attending: Internal Medicine | Admitting: Internal Medicine

## 2013-07-01 DIAGNOSIS — D62 Acute posthemorrhagic anemia: Secondary | ICD-10-CM | POA: Diagnosis present

## 2013-07-01 DIAGNOSIS — K5731 Diverticulosis of large intestine without perforation or abscess with bleeding: Principal | ICD-10-CM | POA: Diagnosis present

## 2013-07-01 DIAGNOSIS — K922 Gastrointestinal hemorrhage, unspecified: Secondary | ICD-10-CM

## 2013-07-01 DIAGNOSIS — K4090 Unilateral inguinal hernia, without obstruction or gangrene, not specified as recurrent: Secondary | ICD-10-CM

## 2013-07-01 DIAGNOSIS — E876 Hypokalemia: Secondary | ICD-10-CM | POA: Diagnosis not present

## 2013-07-01 DIAGNOSIS — E119 Type 2 diabetes mellitus without complications: Secondary | ICD-10-CM | POA: Diagnosis present

## 2013-07-01 DIAGNOSIS — E872 Acidosis, unspecified: Secondary | ICD-10-CM | POA: Diagnosis not present

## 2013-07-01 DIAGNOSIS — F05 Delirium due to known physiological condition: Secondary | ICD-10-CM | POA: Diagnosis not present

## 2013-07-01 DIAGNOSIS — R578 Other shock: Secondary | ICD-10-CM | POA: Diagnosis present

## 2013-07-01 DIAGNOSIS — G909 Disorder of the autonomic nervous system, unspecified: Secondary | ICD-10-CM

## 2013-07-01 DIAGNOSIS — D72829 Elevated white blood cell count, unspecified: Secondary | ICD-10-CM | POA: Diagnosis present

## 2013-07-01 DIAGNOSIS — I739 Peripheral vascular disease, unspecified: Secondary | ICD-10-CM | POA: Diagnosis present

## 2013-07-01 DIAGNOSIS — R791 Abnormal coagulation profile: Secondary | ICD-10-CM | POA: Diagnosis present

## 2013-07-01 DIAGNOSIS — I252 Old myocardial infarction: Secondary | ICD-10-CM

## 2013-07-01 DIAGNOSIS — R079 Chest pain, unspecified: Secondary | ICD-10-CM

## 2013-07-01 DIAGNOSIS — Z79899 Other long term (current) drug therapy: Secondary | ICD-10-CM

## 2013-07-01 DIAGNOSIS — I10 Essential (primary) hypertension: Secondary | ICD-10-CM | POA: Diagnosis present

## 2013-07-01 DIAGNOSIS — D696 Thrombocytopenia, unspecified: Secondary | ICD-10-CM | POA: Diagnosis not present

## 2013-07-01 DIAGNOSIS — Z7902 Long term (current) use of antithrombotics/antiplatelets: Secondary | ICD-10-CM

## 2013-07-01 DIAGNOSIS — R109 Unspecified abdominal pain: Secondary | ICD-10-CM | POA: Diagnosis present

## 2013-07-01 DIAGNOSIS — I2510 Atherosclerotic heart disease of native coronary artery without angina pectoris: Secondary | ICD-10-CM | POA: Diagnosis present

## 2013-07-01 DIAGNOSIS — J95821 Acute postprocedural respiratory failure: Secondary | ICD-10-CM | POA: Diagnosis not present

## 2013-07-01 DIAGNOSIS — Z9861 Coronary angioplasty status: Secondary | ICD-10-CM

## 2013-07-01 DIAGNOSIS — E785 Hyperlipidemia, unspecified: Secondary | ICD-10-CM | POA: Diagnosis present

## 2013-07-01 HISTORY — DX: Gastrointestinal hemorrhage, unspecified: K92.2

## 2013-07-01 LAB — PREPARE RBC (CROSSMATCH)

## 2013-07-01 LAB — CBC WITH DIFFERENTIAL/PLATELET
Basophils Absolute: 0 10*3/uL (ref 0.0–0.1)
Eosinophils Relative: 2 % (ref 0–5)
HCT: 30.8 % — ABNORMAL LOW (ref 39.0–52.0)
Hemoglobin: 10.5 g/dL — ABNORMAL LOW (ref 13.0–17.0)
Lymphocytes Relative: 8 % — ABNORMAL LOW (ref 12–46)
MCHC: 34.1 g/dL (ref 30.0–36.0)
MCV: 94.8 fL (ref 78.0–100.0)
Monocytes Absolute: 1.3 10*3/uL — ABNORMAL HIGH (ref 0.1–1.0)
Monocytes Relative: 7 % (ref 3–12)
Neutro Abs: 15.6 10*3/uL — ABNORMAL HIGH (ref 1.7–7.7)
RDW: 13.4 % (ref 11.5–15.5)
WBC: 18.9 10*3/uL — ABNORMAL HIGH (ref 4.0–10.5)

## 2013-07-01 LAB — BASIC METABOLIC PANEL
BUN: 21 mg/dL (ref 6–23)
CO2: 24 mEq/L (ref 19–32)
Chloride: 105 mEq/L (ref 96–112)
Creatinine, Ser: 1.32 mg/dL (ref 0.50–1.35)
GFR calc Af Amer: 60 mL/min — ABNORMAL LOW (ref >90)
Potassium: 4.2 mEq/L (ref 3.5–5.1)

## 2013-07-01 MED ORDER — PANTOPRAZOLE SODIUM 40 MG IV SOLR
40.0000 mg | Freq: Once | INTRAVENOUS | Status: AC
  Administered 2013-07-01: 40 mg via INTRAVENOUS
  Filled 2013-07-01: qty 40

## 2013-07-01 MED ORDER — SODIUM CHLORIDE 0.9 % IV SOLN
Freq: Once | INTRAVENOUS | Status: AC
  Administered 2013-07-01: 22:00:00 via INTRAVENOUS

## 2013-07-01 MED ORDER — SODIUM CHLORIDE 0.9 % IV BOLUS (SEPSIS)
1000.0000 mL | Freq: Once | INTRAVENOUS | Status: AC
  Administered 2013-07-01: 1000 mL via INTRAVENOUS

## 2013-07-01 NOTE — ED Provider Notes (Signed)
CSN: [id number]     Arrival date & time 07/01/13  2108 History     First MD Initiated Contact with Patient 07/01/13 2131     Chief Complaint  Patient presents with  . Rectal Bleeding   (Consider location/radiation/quality/duration/timing/severity/associated sxs/prior Treatment) Patient is a 72 y.o. male presenting with hematochezia. The history is provided by the patient and the spouse.  Rectal Bleeding Quality:  Bright red Amount:  Copious Duration:  3 hours Timing:  Intermittent Progression:  Worsening Chronicity:  Recurrent (Patient similar episode of rectal bleeding several years ago associated with diverticulosis, which required a transfusion) Context: not anal fissures, not constipation, not defecation, not foreign body, not hemorrhoids, not rectal injury, not rectal pain and not spontaneously   Context comment:  History of diverticulosis Similar prior episodes: yes   Relieved by:  Nothing Worsened by:  Nothing tried Ineffective treatments:  None tried Associated symptoms: dizziness and loss of consciousness   Associated symptoms: no abdominal pain, no epistaxis, no fever, no hematemesis, no light-headedness, no recent illness and no vomiting   Loss of consciousness:    Duration:  30 seconds   Witnessed: yes (By the patient's wife)     Suspicion of head trauma:  No Risk factors: anticoagulant use (Patient is currently taking Plavix)   Risk factors: no liver disease, no NSAID use and no steroid use     Past Medical History  Diagnosis Date  . Coronary artery disease     NSTEMI 2001, 2003   . Hypertension   . Diabetes mellitus   . History of MI (myocardial infarction)   . Hyperlipidemia   . CAD (coronary artery disease)   . Syncope, carotid sinus   . Inguinal hernia    Past Surgical History  Procedure Laterality Date  . None    . Rotator cuff repair  2013    lt  . Eye surgery  2013    Cataracts-both  . Colonoscopy    . Cardiac catheterization  00,03     stents both times  . Inguinal hernia repair Right 06/29/2013    Procedure: HERNIA REPAIR INGUINAL ADULT;  Surgeon: [name], MD;  Location: [facility];  Service: General;  Laterality: Right;  . Insertion of mesh Right 06/29/2013    Procedure: INSERTION OF MESH;  Surgeon: [name], MD;  Location: [facility];  Service: General;  Laterality: Right;   Family History  Problem Relation Age of Onset  . Cancer Brother     colon cancer   History  Substance Use Topics  . Smoking status: Never Smoker   . Smokeless tobacco: Not on file  . Alcohol Use: No    Review of Systems  Constitutional: Positive for fatigue. Negative for fever, chills, diaphoresis, activity change and appetite change.  HENT: Negative for nosebleeds, congestion, sore throat, rhinorrhea, sneezing and trouble swallowing.   Eyes: Negative for pain and redness.  Respiratory: Positive for chest tightness. Negative for cough, choking, shortness of breath, wheezing and stridor.   Cardiovascular: Positive for chest pain. Negative for leg swelling.  Gastrointestinal: Positive for nausea, hematochezia and anal bleeding. Negative for vomiting, abdominal pain, diarrhea, constipation, blood in stool, abdominal distention and hematemesis.  Musculoskeletal: Negative for myalgias and back pain.  Skin: Negative for rash.  Neurological: Positive for dizziness and loss of consciousness. Negative for speech difficulty, weakness, light-headedness, numbness and headaches.  Hematological: Negative for adenopathy.  Psychiatric/Behavioral: Negative for confusion.    Allergies  Review  of patient's allergies indicates no known allergies.  Home Medications   Current Outpatient Rx  Name  Route  Sig  Dispense  Refill  . acetaminophen (TYLENOL) 500 MG tablet   Oral   Take 1,000 mg by mouth every 6 (six) hours as needed for pain.         Marland Kitchen clopidogrel (PLAVIX) 75 MG tablet   Oral   Take 75 mg by mouth  daily.         Marland Kitchen docusate sodium (COLACE) 100 MG capsule   Oral   Take 200 mg by mouth daily as needed for constipation.         Marland Kitchen glimepiride (AMARYL) 4 MG tablet   Oral   Take 8 mg by mouth daily before breakfast.         . loratadine (CLARITIN) 10 MG tablet   Oral   Take 10 mg by mouth daily.          . metFORMIN (GLUCOPHAGE) 1000 MG tablet   Oral   Take 1,000-1,500 mg by mouth See admin instructions. Takes 1 tablet with breakfast and 1.5 tablets with supper.         . metoprolol tartrate (LOPRESSOR) 25 MG tablet   Oral   Take 25 mg by mouth 2 (two) times daily.         . pravastatin (PRAVACHOL) 40 MG tablet   Oral   Take 40 mg by mouth every evening.         . ramipril (ALTACE) 10 MG capsule   Oral   Take 10 mg by mouth daily.         . sitaGLIPtin (JANUVIA) 100 MG tablet   Oral   Take 100 mg by mouth daily.         . traMADol (ULTRAM) 50 MG tablet   Oral   Take 50 mg by mouth every 6 (six) hours as needed for pain.          BP 89/53  Pulse 95  Temp(Src) 97.6 F (36.4 C) (Oral)  Resp 18  SpO2 100% Physical Exam  Nursing note and vitals reviewed. Constitutional: He is oriented to person, place, and time. He appears well-developed and well-nourished. He appears ill. No distress.  HENT:  Head: Normocephalic and atraumatic.  Mouth/Throat: Mucous membranes are pale and dry.  Eyes: Conjunctivae and EOM are normal. Right eye exhibits no discharge. Left eye exhibits no discharge.  Neck: Normal range of motion. Neck supple. No tracheal deviation present.  Cardiovascular: Normal rate, regular rhythm and normal heart sounds.  Exam reveals no friction rub.   No murmur heard. Pulmonary/Chest: Effort normal and breath sounds normal. No stridor. No respiratory distress. He has no wheezes. He has no rales. He exhibits no tenderness.  Abdominal: Soft. He exhibits no distension. There is no tenderness. There is no rebound and no guarding.  Genitourinary:  Rectal exam shows no external hemorrhoid. Guaiac positive stool (Copious frank blood noted in the bed with the patient and also dry blood around the buttocks).  Neurological: He is alert and oriented to person, place, and time.  Skin: Skin is warm.  Psychiatric: He has a normal mood and affect.    ED Course   Procedures (including critical care time)  Labs Reviewed  BASIC METABOLIC PANEL - Abnormal; Notable for the following:    Glucose, Bld 254 (*)    Calcium 8.0 (*)    GFR calc non Af Amer 51 (*)  GFR calc Af Amer 60 (*)    All other components within normal limits  CBC WITH DIFFERENTIAL - Abnormal; Notable for the following:    WBC 18.9 (*)    RBC 3.25 (*)    Hemoglobin 10.5 (*)    HCT 30.8 (*)    Neutrophils Relative % 83 (*)    Neutro Abs 15.6 (*)    Lymphocytes Relative 8 (*)    Monocytes Absolute 1.3 (*)    All other components within normal limits  URINALYSIS, ROUTINE W REFLEX MICROSCOPIC  TYPE AND SCREEN  PREPARE RBC (CROSSMATCH)   Results for orders placed during the hospital encounter of 07/01/13  BASIC METABOLIC PANEL      Result Value Range   Sodium 137  135 - 145 mEq/L   Potassium 4.2  3.5 - 5.1 mEq/L   Chloride 105  96 - 112 mEq/L   CO2 24  19 - 32 mEq/L   Glucose, Bld 254 (*) 70 - 99 mg/dL   BUN 21  6 - 23 mg/dL   Creatinine, Ser 0.98  0.50 - 1.35 mg/dL   Calcium 8.0 (*) 8.4 - 10.5 mg/dL   GFR calc non Af Amer 51 (*) >90 mL/min   GFR calc Af Amer 60 (*) >90 mL/min  CBC WITH DIFFERENTIAL      Result Value Range   WBC 18.9 (*) 4.0 - 10.5 K/uL   RBC 3.25 (*) 4.22 - 5.81 MIL/uL   Hemoglobin 10.5 (*) 13.0 - 17.0 g/dL   HCT 11.9 (*) 14.7 - 82.9 %   MCV 94.8  78.0 - 100.0 fL   MCH 32.3  26.0 - 34.0 pg   MCHC 34.1  30.0 - 36.0 g/dL   RDW 56.2  13.0 - 86.5 %   Platelets 256  150 - 400 K/uL   Neutrophils Relative % 83 (*) 43 - 77 %   Neutro Abs 15.6 (*) 1.7 - 7.7 K/uL   Lymphocytes Relative 8 (*) 12 - 46 %   Lymphs Abs 1.6  0.7 - 4.0 K/uL   Monocytes  Relative 7  3 - 12 %   Monocytes Absolute 1.3 (*) 0.1 - 1.0 K/uL   Eosinophils Relative 2  0 - 5 %   Eosinophils Absolute 0.3  0.0 - 0.7 K/uL   Basophils Relative 0  0 - 1 %   Basophils Absolute 0.0  0.0 - 0.1 K/uL  TYPE AND SCREEN      Result Value Range   ABO/RH(D) O NEG     Antibody Screen NEG     Sample Expiration 07/04/2013     Unit Number [id number]     Blood Component Type RBC LR PHER1     Unit division 00     Status of Unit ALLOCATED     Transfusion Status OK TO TRANSFUSE     Crossmatch Result Compatible     Unit Number [id number]     Blood Component Type RBC LR PHER1     Unit division 00     Status of Unit ALLOCATED     Transfusion Status OK TO TRANSFUSE     Crossmatch Result Compatible    PREPARE RBC (CROSSMATCH)      Result Value Range   Order Confirmation ORDER PROCESSED BY BLOOD BANK      No results found. No diagnosis found.  MDM  [name] 72 y.o. presents for rectal bleeding. She's also having symptoms of lightheadedness, general malaise, diaphoresis. Afebrile. Heart rate  within normal limits the patient is on a beta blocker. Initial blood pressure 100 over 60s. He appears ill. Patient had multiple episodes of copious amounts of rectal bleeding in the emergency department. 3-point drop in hemoglobin over the past 48 hours. Patient bolused 2 L IV fluids and given one unit of packed red blood cells. Reported subjective improvement of symptoms. Continued to have rectal bleeding. 2 more units transfused. Patient to be admitted for workup of rectal bleeding. GI contacted and will see in the morning. Interventional radiology paged, and awaiting call back. Hospitalist informed of this. Patient admitted without events.   Labs and imaging reviewed. I discussed this pateint's care with my attending, Dr. Rhunette Croft.  Sena Hitch, MD 07/02/13 0038   I performed a history and physical examination of  [name] and discussed his management with Dr.  Malen Gauze. I agree with the history, physical, assessment, and plan of care, with the following exceptions: None I was present for the following procedures: None  Time Spent in Critical Care of the patient: 40 minutes  Time spent in discussions with the patient and family: 20 minutes  Shama Monfils  CRITICAL CARE Performed by: Derwood Kaplan   Total critical care time: 40 minutes  Critical care time was exclusive of separately billable procedures and treating other patients.  Critical care was necessary to treat or prevent imminent or life-threatening deterioration.  Critical care was time spent personally by me on the following activities: development of treatment plan with patient and/or surrogate as well as nursing, discussions with consultants, evaluation of patient's response to treatment, examination of patient, obtaining history from patient or surrogate, ordering and performing treatments and interventions, ordering and review of laboratory studies, ordering and review of radiographic studies, pulse oximetry and re-evaluation of patient's condition.  Pt comes in with cc of hematochezia. DDx includes: PUD/Gastritis/ulcers Diverticular bleed Internal hemorrhoids  Hx of diverticular bleed. Pt's initial Hb shows drop from 13 to 10 in 2 days, but he is pale, and had syncope. Suspect need for transfusion, and we initiated it, as patient had persistent large BM. GI consulted, unable to reach IR.        Derwood Kaplan, MD 07/03/13 1558

## 2013-07-01 NOTE — ED Notes (Signed)
[facility] present with 72 yo male from home with rectal bleeding.  Pt reports having a BM at home approximately 1 hour ago with bright red blood.  Pt became pale and diaphoretic with good mentation.  [facility] gave 4L O2 and diaphoresis and color improved.  Pt had inguinal hernia repair 2 days ago. Hx. Of MI and lower GI bleeding.  Pt received total of 1100 ml of NS.  ASA restriction b/c of Plavix.

## 2013-07-02 DIAGNOSIS — E785 Hyperlipidemia, unspecified: Secondary | ICD-10-CM

## 2013-07-02 DIAGNOSIS — R079 Chest pain, unspecified: Secondary | ICD-10-CM

## 2013-07-02 LAB — COMPREHENSIVE METABOLIC PANEL
ALT: 8 U/L (ref 0–53)
AST: 11 U/L (ref 0–37)
Calcium: 7.6 mg/dL — ABNORMAL LOW (ref 8.4–10.5)
Creatinine, Ser: 1.05 mg/dL (ref 0.50–1.35)
Sodium: 142 mEq/L (ref 135–145)
Total Protein: 4.9 g/dL — ABNORMAL LOW (ref 6.0–8.3)

## 2013-07-02 LAB — APTT: aPTT: 27 seconds (ref 24–37)

## 2013-07-02 LAB — GLUCOSE, CAPILLARY
Glucose-Capillary: 245 mg/dL — ABNORMAL HIGH (ref 70–99)
Glucose-Capillary: 127 mg/dL — ABNORMAL HIGH (ref 70–99)
Glucose-Capillary: 127 mg/dL — ABNORMAL HIGH (ref 70–99)
Glucose-Capillary: 134 mg/dL — ABNORMAL HIGH (ref 70–99)

## 2013-07-02 LAB — URINALYSIS, ROUTINE W REFLEX MICROSCOPIC
Glucose, UA: >1000 mg/dL — AB
Leukocytes, UA: NEGATIVE
Protein, ur: NEGATIVE mg/dL
Specific Gravity, Urine: 1.03 (ref 1.005–1.030)
Urobilinogen, UA: 0.2 mg/dL (ref 0.0–1.0)

## 2013-07-02 LAB — PROTIME-INR
INR: 1.29 (ref 0.00–1.49)
INR: 1.17 (ref 0.00–1.49)
Prothrombin Time: 15.8 seconds — ABNORMAL HIGH (ref 11.6–15.2)

## 2013-07-02 LAB — CBC
HCT: 30.3 % — ABNORMAL LOW (ref 39.0–52.0)
HCT: 26.7 % — ABNORMAL LOW (ref 39.0–52.0)
Hemoglobin: 10.5 g/dL — ABNORMAL LOW (ref 13.0–17.0)
MCH: 31.3 pg (ref 26.0–34.0)
MCHC: 34.7 g/dL (ref 30.0–36.0)
MCV: 89.9 fL (ref 78.0–100.0)
Platelets: 156 10*3/uL (ref 150–400)
RBC: 3.38 MIL/uL — ABNORMAL LOW (ref 4.22–5.81)
RBC: 2.97 MIL/uL — ABNORMAL LOW (ref 4.22–5.81)
WBC: 7.6 10*3/uL (ref 4.0–10.5)

## 2013-07-02 LAB — URINE MICROSCOPIC-ADD ON

## 2013-07-02 LAB — LACTIC ACID, PLASMA: Lactic Acid, Venous: 1 mmol/L (ref 0.5–2.2)

## 2013-07-02 MED ORDER — SODIUM CHLORIDE 0.9 % IV SOLN
INTRAVENOUS | Status: DC
Start: 2013-07-02 — End: 2013-07-06
  Administered 2013-07-02 – 2013-07-06 (×7): via INTRAVENOUS

## 2013-07-02 MED ORDER — INSULIN ASPART 100 UNIT/ML ~~LOC~~ SOLN
0.0000 [IU] | SUBCUTANEOUS | Status: DC
  Administered 2013-07-02: 2 [IU] via SUBCUTANEOUS
  Administered 2013-07-02: 3 [IU] via SUBCUTANEOUS
  Administered 2013-07-02: 1 [IU] via SUBCUTANEOUS
  Administered 2013-07-02: 5 [IU] via SUBCUTANEOUS
  Administered 2013-07-02 (×2): 1 [IU] via SUBCUTANEOUS
  Administered 2013-07-03: 3 [IU] via SUBCUTANEOUS
  Administered 2013-07-03: 2 [IU] via SUBCUTANEOUS
  Administered 2013-07-03: 3 [IU] via SUBCUTANEOUS
  Administered 2013-07-03: 1 [IU] via SUBCUTANEOUS
  Administered 2013-07-04: 2 [IU] via SUBCUTANEOUS
  Administered 2013-07-04 (×5): 3 [IU] via SUBCUTANEOUS
  Administered 2013-07-05: 1 [IU] via SUBCUTANEOUS
  Administered 2013-07-05: 2 [IU] via SUBCUTANEOUS
  Administered 2013-07-05: 3 [IU] via SUBCUTANEOUS
  Administered 2013-07-05 (×2): 2 [IU] via SUBCUTANEOUS
  Administered 2013-07-05 – 2013-07-06 (×2): 5 [IU] via SUBCUTANEOUS
  Administered 2013-07-06: 1 [IU] via SUBCUTANEOUS
  Administered 2013-07-06: 2 [IU] via SUBCUTANEOUS
  Administered 2013-07-06: 1 [IU] via SUBCUTANEOUS
  Administered 2013-07-06 – 2013-07-07 (×2): 3 [IU] via SUBCUTANEOUS
  Administered 2013-07-07 (×3): 5 [IU] via SUBCUTANEOUS
  Administered 2013-07-07: 3 [IU] via SUBCUTANEOUS
  Administered 2013-07-07: 5 [IU] via SUBCUTANEOUS
  Administered 2013-07-08: 2 [IU] via SUBCUTANEOUS
  Administered 2013-07-08: 21:00:00 via SUBCUTANEOUS
  Administered 2013-07-08: 3 [IU] via SUBCUTANEOUS
  Administered 2013-07-08: 2 [IU] via SUBCUTANEOUS
  Administered 2013-07-08: 3 [IU] via SUBCUTANEOUS
  Administered 2013-07-08: 2 [IU] via SUBCUTANEOUS
  Administered 2013-07-09: 3 [IU] via SUBCUTANEOUS
  Administered 2013-07-09 (×3): 2 [IU] via SUBCUTANEOUS
  Administered 2013-07-09 (×2): 3 [IU] via SUBCUTANEOUS
  Administered 2013-07-10 (×2): 2 [IU] via SUBCUTANEOUS

## 2013-07-02 MED ORDER — CIPROFLOXACIN IN D5W 400 MG/200ML IV SOLN
400.0000 mg | Freq: Two times a day (BID) | INTRAVENOUS | Status: DC
  Administered 2013-07-02 – 2013-07-03 (×3): 400 mg via INTRAVENOUS
  Filled 2013-07-02 (×4): qty 200

## 2013-07-02 MED ORDER — METRONIDAZOLE IN NACL 5-0.79 MG/ML-% IV SOLN
500.0000 mg | Freq: Three times a day (TID) | INTRAVENOUS | Status: DC
  Administered 2013-07-02 – 2013-07-03 (×5): 500 mg via INTRAVENOUS
  Filled 2013-07-02 (×6): qty 100

## 2013-07-02 MED ORDER — SODIUM CHLORIDE 0.9 % IJ SOLN
3.0000 mL | Freq: Two times a day (BID) | INTRAMUSCULAR | Status: DC
  Administered 2013-07-02 – 2013-07-06 (×8): 3 mL via INTRAVENOUS
  Administered 2013-07-07: 10 mL via INTRAVENOUS
  Administered 2013-07-07 – 2013-07-13 (×6): 3 mL via INTRAVENOUS

## 2013-07-02 NOTE — Progress Notes (Addendum)
[facility] PROGRESS NOTE  Timothy [name] ZOX:[id number] DOB: Sep 07, 1941 DOA: 07/01/2013 PCP: [name], MD   HPI/Subjective: Feels much better, denies any abdominal discomfort. Has mild pain around his inguinal hernia incision scar.  Assessment/Plan:  Lower GI bleed -Significant GI bleed, status post transfusion of 4 units of packed RBCs. -Patient is on Plavix, this is held, I will not be surprised if he restarts bleeding. -Seems like the bleeding stopped for now, no bowel movement since early morning. -Likely diverticular bleed, has documented sigmoid diverticulosis. -Appreciate GI help, colonoscopy is being considered.  Hypotension -Likely secondary to hemorrhagic shock from significant GI bleed. -Status post transfusion of 4 units of pack rbc's, blood pressure is reasonable now. -Continue to hold antihypertensive medications including metoprolol and ramipril.  Leukocytosis -Likely secondary to stress de-margination. -Patient is empirically on Cipro and Flagyl, continue for today if no fever probably discontinue in a.m.  Diabetes mellitus type 2 -Patient continued to be n.p.o., ice chips Dilaudid. -Hold hypoglycemic agents, insulin sliding scale.  CAD -History of this territory ago, patient is on Plavix. -Hold Plavix for now, hold metoprolol and Altace for low blood pressure. -I see no indication for plasma transfusion I will discontinue it.  Code Status: Full code Family Communication: Plan discussed with the patient Disposition Plan: Remains inpatient   Consultants:  Gastroenterology  Procedures:  None  Antibiotics:  None   Objective: Filed Vitals:   07/02/13 0810 07/02/13 0910 07/02/13 1008 07/02/13 1149  BP: 104/58 110/57 105/55 117/53  Pulse: 89 88 85 87  Temp: 98.4 F (36.9 C) 98.5 F (36.9 C) 98.6 F (37 C) 98.3 F (36.8 C)  TempSrc: Oral Oral Oral Oral  Resp: 15 12 10 12   Height:      Weight:      SpO2:    98%     Intake/Output Summary (Last 24 hours) at 07/02/13 1224 Last data filed at 07/02/13 1000  Gross per 24 hour  Intake   1437 ml  Output   1500 ml  Net    -63 ml   Filed Weights   07/02/13 0112  Weight: 104.2 kg (229 lb 11.5 oz)    Exam: General: Alert and awake, oriented x3, not in any acute distress. HEENT: anicteric sclera, pupils reactive to light and accommodation, EOMI CVS: S1-S2 clear, no murmur rubs or gallops Chest: clear to auscultation bilaterally, no wheezing, rales or rhonchi Abdomen: soft nontender, nondistended, normal bowel sounds, no organomegaly Extremities: no cyanosis, clubbing or edema noted bilaterally Neuro: Cranial nerves II-XII intact, no focal neurological deficits  Data Reviewed: Basic Metabolic Panel:  Recent Labs Lab 07/01/13 2129  NA 137  K 4.2  CL 105  CO2 24  GLUCOSE 254*  BUN 21  CREATININE 1.32  CALCIUM 8.0*   Liver Function Tests: No results found for this basename: AST, ALT, ALKPHOS, BILITOT, PROT, ALBUMIN,  in the last 168 hours No results found for this basename: LIPASE, AMYLASE,  in the last 168 hours No results found for this basename: AMMONIA,  in the last 168 hours CBC:  Recent Labs Lab 06/29/13 0720 07/01/13 2129  WBC  --  18.9*  NEUTROABS  --  15.6*  HGB 13.8 10.5*  HCT  --  30.8*  MCV  --  94.8  PLT  --  256   Cardiac Enzymes: No results found for this basename: CKTOTAL, CKMB, CKMBINDEX, TROPONINI,  in the last 168 hours BNP (last 3 results) No results found for this basename: PROBNP,  in the last 8760 hours CBG:  Recent Labs Lab 06/29/13 0717 07/02/13 0052 07/02/13 0341  GLUCAP 138* 245* 275*    Recent Results (from the past 240 hour(s))  MRSA PCR SCREENING     Status: None   Collection Time    07/02/13  3:01 AM      Result Value Range Status   MRSA by PCR NEGATIVE  NEGATIVE Final   Comment:            The GeneXpert MRSA Assay (FDA     approved for NASAL specimens     only), is one component of  a     comprehensive MRSA colonization     surveillance program. It is not     intended to diagnose MRSA     infection nor to guide or     monitor treatment for     MRSA infections.     Studies: Dg Chest Portable 1 View  07/01/2013   *RADIOLOGY REPORT*  Clinical Data: Rectal bleeding  PORTABLE CHEST - 1 VIEW  Comparison: CTA chest dated 09/18/2012  Findings: Lungs are clear. No pleural effusion or pneumothorax.  The heart is top normal in size.  Mild right perihilar/paramediastinal prominence, likely related to technique.  IMPRESSION: No evidence of acute cardiopulmonary disease.  Mild right perihilar/paramediastinal prominence, likely related to technique.  Consider follow-up PA/lateral chest radiographs for further evaluation.   Original Report Authenticated By: Charline Bills, M.D.    Scheduled Meds: . ciprofloxacin  400 mg Intravenous Q12H  . insulin aspart  0-9 Units Subcutaneous Q4H  . metronidazole  500 mg Intravenous Q8H  . sodium chloride  3 mL Intravenous Q12H   Continuous Infusions:   Principal Problem:   Acute lower GI bleeding Active Problems:   CAD, NATIVE VESSEL   Abdominal pain    Time spent: 35  minutes    [facility]  [facility] Pager [phone]. If 7PM-7AM, please contact night-coverage at [email or web address], password [facility] 07/02/2013, 12:24 PM  LOS: 1 day

## 2013-07-02 NOTE — H&P (Addendum)
Timothy [name] is an 73 y.o. male.    [facility] ADMISSION H&P  Chief Complaint: BRBPR  HPI: 72 yr. Old WM w/ pmhx significant for CAD s/p DES and BMS in 2003 (NSTEMI in 2001, 2002), Type 2 DM, HTN, HL, presents with BRBPR.  He states today he has had two bloody BM's and began to have associated dizziness. He states he has diffuse abdominal pain, but more in the LLQ. He denies CP or SOB. In the ED, he was noted to have two additional bloody BM's.  Once of these I witnessed myself.  His lowest SBP has been 89 and he is currently receiving his first unit of PRBCs.  His Hgb is 10, decreased from a previous value of 13.8 on 7/24.  He is s/p right inguinal hernia repair on 06/29/13.  He is on Plavix, but not ASA.  He denies any further NSAID use.   I discussed the case with Dr. Matthias Hughs of [facility]. He had an admission on 01/2010 with similar presentation, at that time determined to be a diverticular bleed. He has a colonoscopy which was performed on 07/2009 which revealed extensive diverticulosis (sigmoid and descending).  Past Medical History  Diagnosis Date  . Coronary artery disease     NSTEMI 2001, 2003   . Hypertension   . Diabetes mellitus   . History of MI (myocardial infarction)   . Hyperlipidemia   . CAD (coronary artery disease)   . Syncope, carotid sinus   . Inguinal hernia     Past Surgical History  Procedure Laterality Date  . None    . Rotator cuff repair  2013    lt  . Eye surgery  2013    Cataracts-both  . Colonoscopy    . Cardiac catheterization  00,03    stents both times  . Inguinal hernia repair Right 06/29/2013    Procedure: HERNIA REPAIR INGUINAL ADULT;  Surgeon: [name], MD;  Location: [facility];  Service: General;  Laterality: Right;  . Insertion of mesh Right 06/29/2013    Procedure: INSERTION OF MESH;  Surgeon: [name], MD;  Location: [facility];  Service: General;  Laterality: Right;    Family History   Problem Relation Age of Onset  . Cancer Brother     colon cancer   Social History:  reports that he has never smoked. He does not have any smokeless tobacco history on file. He reports that he does not drink alcohol or use illicit drugs.  Allergies: No Known Allergies   (Not in a hospital admission)  Results for orders placed during the hospital encounter of 07/01/13 (from the past 48 hour(s))  TYPE AND SCREEN     Status: None   Collection Time    07/01/13  9:22 PM      Result Value Range   ABO/RH(D) O NEG     Antibody Screen NEG     Sample Expiration 07/04/2013     Unit Number [id number]     Blood Component Type RBC LR PHER1     Unit division 00     Status of Unit ISSUED     Transfusion Status OK TO TRANSFUSE     Crossmatch Result Compatible     Unit Number [id number]     Blood Component Type RBC LR PHER1     Unit division 00     Status of Unit ALLOCATED     Transfusion Status OK TO TRANSFUSE  Crossmatch Result Compatible    BASIC METABOLIC PANEL     Status: Abnormal   Collection Time    07/01/13  9:29 PM      Result Value Range   Sodium 137  135 - 145 mEq/L   Potassium 4.2  3.5 - 5.1 mEq/L   Chloride 105  96 - 112 mEq/L   CO2 24  19 - 32 mEq/L   Glucose, Bld 254 (*) 70 - 99 mg/dL   BUN 21  6 - 23 mg/dL   Creatinine, Ser 4.78  0.50 - 1.35 mg/dL   Calcium 8.0 (*) 8.4 - 10.5 mg/dL   GFR calc non Af Amer 51 (*) >90 mL/min   GFR calc Af Amer 60 (*) >90 mL/min   Comment:            The eGFR has been calculated     using the CKD EPI equation.     This calculation has not been     validated in all clinical     situations.     eGFR's persistently     <90 mL/min signify     possible Chronic Kidney Disease.  CBC WITH DIFFERENTIAL     Status: Abnormal   Collection Time    07/01/13  9:29 PM      Result Value Range   WBC 18.9 (*) 4.0 - 10.5 K/uL   RBC 3.25 (*) 4.22 - 5.81 MIL/uL   Hemoglobin 10.5 (*) 13.0 - 17.0 g/dL   HCT 29.5 (*) 62.1 - 30.8 %   MCV 94.8   78.0 - 100.0 fL   MCH 32.3  26.0 - 34.0 pg   MCHC 34.1  30.0 - 36.0 g/dL   RDW 65.7  84.6 - 96.2 %   Platelets 256  150 - 400 K/uL   Neutrophils Relative % 83 (*) 43 - 77 %   Neutro Abs 15.6 (*) 1.7 - 7.7 K/uL   Lymphocytes Relative 8 (*) 12 - 46 %   Lymphs Abs 1.6  0.7 - 4.0 K/uL   Monocytes Relative 7  3 - 12 %   Monocytes Absolute 1.3 (*) 0.1 - 1.0 K/uL   Eosinophils Relative 2  0 - 5 %   Eosinophils Absolute 0.3  0.0 - 0.7 K/uL   Basophils Relative 0  0 - 1 %   Basophils Absolute 0.0  0.0 - 0.1 K/uL  PREPARE RBC (CROSSMATCH)     Status: None   Collection Time    07/01/13  9:42 PM      Result Value Range   Order Confirmation ORDER PROCESSED BY BLOOD BANK    CG4 I-STAT (LACTIC ACID)     Status: Abnormal   Collection Time    07/01/13 10:14 PM      Result Value Range   Lactic Acid, Venous 2.67 (*) 0.5 - 2.2 mmol/L  PREPARE RBC (CROSSMATCH)     Status: None   Collection Time    07/01/13 11:40 PM      Result Value Range   Order Confirmation ORDER PROCESSED BY BLOOD BANK     Dg Chest Portable 1 View  07/01/2013   *RADIOLOGY REPORT*  Clinical Data: Rectal bleeding  PORTABLE CHEST - 1 VIEW  Comparison: CTA chest dated 09/18/2012  Findings: Lungs are clear. No pleural effusion or pneumothorax.  The heart is top normal in size.  Mild right perihilar/paramediastinal prominence, likely related to technique.  IMPRESSION: No evidence of acute cardiopulmonary disease.  Mild right perihilar/paramediastinal  prominence, likely related to technique.  Consider follow-up PA/lateral chest radiographs for further evaluation.   Original Report Authenticated By: Charline Bills, M.D.    Review of Systems  Constitutional: Positive for malaise/fatigue and diaphoresis. Negative for fever, chills and weight loss.  Eyes: Negative for blurred vision.  Respiratory: Negative for cough and shortness of breath.   Cardiovascular: Negative for chest pain, palpitations, orthopnea, claudication and PND.   Gastrointestinal: Positive for abdominal pain and blood in stool.  Genitourinary: Negative for dysuria.  Neurological: Positive for dizziness. Negative for loss of consciousness and headaches.    Blood pressure 89/53, pulse 95, temperature 97.4 F (36.3 C), temperature source Oral, resp. rate 18, SpO2 100.00%. Physical Exam  Constitutional: He is oriented to person, place, and time. He appears well-developed. No distress.  HENT:  Head: Normocephalic and atraumatic.  Eyes: Conjunctivae and EOM are normal. Pupils are equal, round, and reactive to light.  Neck: Normal range of motion. Neck supple. No JVD present. No thyromegaly present.  Cardiovascular: Normal rate, regular rhythm and normal heart sounds.   Respiratory: Breath sounds normal. He has no wheezes.  GI: Soft. He exhibits no distension and no mass. There is no splenomegaly or hepatomegaly. There is tenderness in the left lower quadrant. There is no rebound and no guarding.  Musculoskeletal: He exhibits no edema.  Neurological: He is alert and oriented to person, place, and time. No cranial nerve deficit.  Skin: Skin is warm. He is diaphoretic.  Psychiatric: He has a normal mood and affect.     Assessment/Plan  72 yr. Old WM w/ pmhx significant for CAD s/p DES and BMS in 2003 (NSTEMI in 2001, 2002), Type 2 DM, HTN, HL, presents with BRBPR. 1) Lower GI bleed: This is very likely a lower GI bleed. His repeat SBP is 110, which I checked myself. He is not currently complaining of dizziness. If he continues to bleed, the next step will be to consult interventional radiology and/or general surgery.  At this time, he will have two large bore PIV's placed.  I have ordered 3 additional units of PRBCs to be transfused given the amount of bleeding and his cardiac history. I do not suspect and upper GI bleed as his BUN is normal and he has not had vomiting.  Given his leukocytosis, I have gone ahead and started IV Cipro/Flagyl.  Consider CT  abd/pelvis once his hemodynamics improve. 2) Type 2 DM: NPO. ISS to be given and BG monitored. 3) HTN: Hold all BP meds. 4) CAD: stents over 1 year ago, hold plavix in setting of GI bleed. 5) Proph: SCDs. 6) Code: FULL  Marshall Kampf 07/02/2013, 12:04 AM  ADDENDUM: AWAITING A RESPONSE FROM IR, NO CALL BACK YET. I HAVE GONE AHEAD AND CALLED DR. BLACKMAN OF SURGERY TO EVALUATE HIM. HE HAS HAD TWO MORE BLOODY BM'S SINCE I LAST SAW HIM. HIS SBP ~90. HE STATES HE FEELS SLIGHTLY BETTER, NO DIZZY.  HE WILL BE MONITORED CLOSELY. GIVEN THE AMOUNT OF PRBCS ORDERED, I WILL GO AHEAD AND PLAN TO GIVE FFP. Jonah Blue  ADDENDUN CASE DISCUSSED WITH INTERVENTIONAL RADIOLOGY ON CALL. PATIENT CURRENTLY NOT ACTIVELY BLEEDING, SO TAGGED RBC SCAN NOT HELPFUL. BUT BP IS 70/40'S SITTING. I DISCUSSED THE CASE WITH DR. Darrick Penna OF PCCM FOR INPUT. HER RECS INCLUDE CONTINUED PRBC TRANSFUSION, MONITORING AND BLADDER SCAN SINCE NO UOP RECORDED. SHE WILL BE FOLLOWING THE PATIENT, FOR NOW CAN STAY IN STEPDOWN.  Jonah Blue

## 2013-07-02 NOTE — Consult Note (Signed)
Referring Provider: Dr. Madalyn Rob ([facility]) Primary Care Physician:  [name], MD Primary Gastroenterologist:  Dr. Wandalee Ferdinand  Reason for Consultation:  Severe GI bleeding  HPI: Timothy [name] is a 72 y.o. male admitted through the emergency room late last night with a roughly 4 hour history of recurrent, large volume, painless hematochezia associated with syncope. The patient has known left-sided diverticulosis, with his most recent colonoscopy in 07/09/2009 by Dr. Wandalee Ferdinand showing sigmoid and left-sided diverticula.  He presented to the emergency room, where he was shocky, blood pressure in the 80s, and his hemoglobin had dropped 3 g since repair of a right inguinal hernia 3 days earlier, to a level of 10.5. BUN was normal at 21 and the stool was non-melenic in character, so this was felt to be lower GI bleeding.  There is no history of exposure to aspirin or nonsteroidal anti-inflammatory drugs, although the patient is on Plavix because of a history of coronary artery disease. There is no history of antecedent constipation to suggest he is at risk for stercoral ulceration, nor has he been having to strain at stool recently to suggest hemorrhoidal problems.  In February 2011, the patient had similar GI bleeding at which time endoscopy was negative for a bleeding source. A colonoscopy was not performed at that time.  The patient had multiple large-volume bloody bowel movements around the time of admission, but around midnight, the bleeding stopped and he has had no further bowel movements. His hemodynamics are improved. He has received 4 units of packed cells, and an updated hemoglobin level is pending.     Past Medical History  Diagnosis Date  . Coronary artery disease     NSTEMI 2001, 2003   . Hypertension   . Diabetes mellitus   . History of MI (myocardial infarction)   . Hyperlipidemia   . CAD (coronary artery disease)   . Syncope, carotid sinus   . Inguinal  hernia     Past Surgical History  Procedure Laterality Date  . None    . Rotator cuff repair  2013    lt  . Eye surgery  2013    Cataracts-both  . Colonoscopy    . Cardiac catheterization  00,03    stents both times  . Inguinal hernia repair Right 06/29/2013    Procedure: HERNIA REPAIR INGUINAL ADULT;  Surgeon: [name], MD;  Location: [facility];  Service: General;  Laterality: Right;  . Insertion of mesh Right 06/29/2013    Procedure: INSERTION OF MESH;  Surgeon: [name], MD;  Location: [facility];  Service: General;  Laterality: Right;    Prior to Admission medications   Medication Sig Start Date End Date Taking? Authorizing Provider  acetaminophen (TYLENOL) 500 MG tablet Take 1,000 mg by mouth every 6 (six) hours as needed for pain.   Yes Historical Provider, MD  clopidogrel (PLAVIX) 75 MG tablet Take 75 mg by mouth daily.   Yes Historical Provider, MD  docusate sodium (COLACE) 100 MG capsule Take 200 mg by mouth daily as needed for constipation.   Yes Historical Provider, MD  glimepiride (AMARYL) 4 MG tablet Take 8 mg by mouth daily before breakfast.   Yes Historical Provider, MD  loratadine (CLARITIN) 10 MG tablet Take 10 mg by mouth daily.    Yes Historical Provider, MD  metFORMIN (GLUCOPHAGE) 1000 MG tablet Take 1,000-1,500 mg by mouth See admin instructions. Takes 1 tablet with breakfast and 1.5 tablets  with supper.   Yes Historical Provider, MD  metoprolol tartrate (LOPRESSOR) 25 MG tablet Take 25 mg by mouth 2 (two) times daily.   Yes Historical Provider, MD  pravastatin (PRAVACHOL) 40 MG tablet Take 40 mg by mouth every evening.   Yes Historical Provider, MD  ramipril (ALTACE) 10 MG capsule Take 10 mg by mouth daily. 09/30/11 06/12/14 Yes Kathleene Hazel, MD  sitaGLIPtin (JANUVIA) 100 MG tablet Take 100 mg by mouth daily.   Yes Historical Provider, MD  traMADol (ULTRAM) 50 MG tablet Take 50 mg by mouth every 6 (six) hours as  needed for pain.   Yes Historical Provider, MD    Current Facility-Administered Medications  Medication Dose Route Frequency Provider Last Rate Last Dose  . ciprofloxacin (CIPRO) IVPB 400 mg  400 mg Intravenous Q12H Jonah Blue, DO   400 mg at 07/02/13 0529  . insulin aspart (novoLOG) injection 0-9 Units  0-9 Units Subcutaneous Q4H Jonah Blue, DO   2 Units at 07/02/13 [phone]  . metroNIDAZOLE (FLAGYL) IVPB 500 mg  500 mg Intravenous Q8H Alejandro Paya, DO   500 mg at 07/02/13 1017  . sodium chloride 0.9 % injection 3 mL  3 mL Intravenous Q12H Jonah Blue, DO   3 mL at 07/02/13 1020    Allergies as of 07/01/2013  . (No Known Allergies)    Family History  Problem Relation Age of Onset  . Cancer Brother     colon cancer    History   Social History  . Marital Status: Married    Spouse Name: N/A    Number of Children: N/A  . Years of Education: N/A   Occupational History  . Not on file.   Social History Main Topics  . Smoking status: Never Smoker   . Smokeless tobacco: Not on file  . Alcohol Use: No  . Drug Use: No  . Sexually Active: Not on file   Other Topics Concern  . Not on file   Social History Narrative  . No narrative on file    Review of Systems: Negative for prodromal upper tract symptoms such as anorexia, weight loss, reflux, dyspepsia, or nausea. No abdominal pain in association with the current episode of bleeding.  Physical Exam: Vital signs in last 24 hours: Temp:  [97.3 F (36.3 C)-98.6 F (37 C)] 98.6 F (37 C) (07/27 1008) Pulse Rate:  [85-95] 85 (07/27 1008) Resp:  [9-21] 10 (07/27 1008) BP: (78-110)/(49-63) 105/55 mmHg (07/27 1008) SpO2:  [98 %-100 %] 100 % (07/27 0407) Weight:  [104.2 kg (229 lb 11.5 oz)] 104.2 kg (229 lb 11.5 oz) (07/27 0112) Last BM Date: 07/02/13 General:   Alert,  Well-developed, well-nourished, pleasant and cooperative in NAD. He looks well, status post 4 units packed cells. His complexion is still a little bit  pale. Head:  Normocephalic and atraumatic. Eyes:  Sclera clear, no icterus.   Conjunctiva pink. Neck:   No masses or thyromegaly. Lungs:  Clear throughout to auscultation.   No wheezes, crackles, or rhonchi. No evident respiratory distress. Heart:   Regular rate and rhythm; no murmurs, clicks, rubs,  or gallops. Abdomen:  Soft, nontender, nontympanitic, and nondistended. No masses, hepatosplenomegaly or ventral hernias noted. Normal bowel sounds, without bruits, guarding, or rebound.   Msk:   Symmetrical without gross deformities. Extremities:   Without clubbing, cyanosis, or edema. Neurologic:  Alert and coherent;  grossly normal neurologically. Skin:  Intact without significant lesions or rashes. Cervical Nodes:  No significant cervical  adenopathy. Psych:   Alert and cooperative. Normal mood and affect.  Intake/Output from previous day: 07/26 0701 - 07/27 0700 In: 1087 [I.V.:500; Blood:287; IV Piggyback:300] Out: 1000 [Urine:1000] Intake/Output this shift: Total I/O In: 350 [Blood:350] Out: 500 [Urine:500]  Lab Results:  Recent Labs  07/01/13 2129  WBC 18.9*  HGB 10.5*  HCT 30.8*  PLT 256   BMET  Recent Labs  07/01/13 2129  NA 137  K 4.2  CL 105  CO2 24  GLUCOSE 254*  BUN 21  CREATININE 1.32  CALCIUM 8.0*   LFT No results found for this basename: PROT, ALBUMIN, AST, ALT, ALKPHOS, BILITOT, BILIDIR, IBILI,  in the last 72 hours PT/INR  Recent Labs  07/02/13 0115  LABPROT 15.8*  INR 1.29    Studies/Results: Dg Chest Portable 1 View  07/01/2013   *RADIOLOGY REPORT*  Clinical Data: Rectal bleeding  PORTABLE CHEST - 1 VIEW  Comparison: CTA chest dated 09/18/2012  Findings: Lungs are clear. No pleural effusion or pneumothorax.  The heart is top normal in size.  Mild right perihilar/paramediastinal prominence, likely related to technique.  IMPRESSION: No evidence of acute cardiopulmonary disease.  Mild right perihilar/paramediastinal prominence, likely related to  technique.  Consider follow-up PA/lateral chest radiographs for further evaluation.   Original Report Authenticated By: Charline Bills, M.D.    Impression: 1. Severe hematochezia, currently quiescent, suggestive of diverticular origin, with transient hemodynamic instability, now improved status post transfusion 2. Posthemorrhagic anemia 3. Status post recent right inguinal hernia repair 4. History of documented left-sided diverticulosis, with similar bleeding episode 3 years ago attributed to diverticular origin  Plan: 1. Continue supportive care with transfusion as needed 2. Keep in mind that, if this is diverticular bleeding, it will frequently restart after a period of quiescent that maybe hours or even several days. 3. If the patient has a mild to moderate rebleed on this admission, he can probably be managed with transfusion support. 4. If the patient has a severe, destabilizing, or persistent bleeding problem, I would advocate surgical consultation and possible interventional radiology consultation for possible angiography. 5. I think this patient needs colonoscopy since it has been 4 years since his last exam. The question is the optimal timing of that exam. We will plan to confer with Dr. Gerrit Friends tomorrow to see if this patient is a candidate for colonoscopy on this admission, taking into consideration his very recent inguinal hernia repair. If it is okay to do colonoscopy, it would probably be reasonable to do a colonoscopy on this admission to verify the absence of any evanescent lesions such as ischemic colitis (doubt), stercoral ulceration, et Karie Soda. If it is felt more prudent to hold off on colonoscopy for the time being, I would consider doing that exam a month or 2 after discharge. 6. Depending on the patient's clinical L. Brown Human she during this hospitalization, and the findings on colonoscopy, it might be appropriate to consider this patient for an elective colon resection, since  he has now had 2 (presumed) diverticular bleeds roughly 3 years apart, with the current one being very severe in terms of hemodynamic and symptomatic compromise.   LOS: 1 day   Elveria Lauderbaugh V  07/02/2013, 11:24 AM

## 2013-07-03 DIAGNOSIS — I10 Essential (primary) hypertension: Secondary | ICD-10-CM

## 2013-07-03 LAB — CBC
HCT: 25.9 % — ABNORMAL LOW (ref 39.0–52.0)
Hemoglobin: 9.3 g/dL — ABNORMAL LOW (ref 13.0–17.0)
Hemoglobin: 9.1 g/dL — ABNORMAL LOW (ref 13.0–17.0)
MCH: 31.7 pg (ref 26.0–34.0)
MCH: 30.6 pg (ref 26.0–34.0)
MCH: 31.3 pg (ref 26.0–34.0)
MCHC: 35.4 g/dL (ref 30.0–36.0)
MCHC: 33.9 g/dL (ref 30.0–36.0)
MCV: 90.1 fL (ref 78.0–100.0)
Platelets: 154 10*3/uL (ref 150–400)
Platelets: 162 10*3/uL (ref 150–400)
RBC: 3.04 MIL/uL — ABNORMAL LOW (ref 4.22–5.81)
RBC: 2.91 MIL/uL — ABNORMAL LOW (ref 4.22–5.81)
RDW: 15 % (ref 11.5–15.5)

## 2013-07-03 LAB — GLUCOSE, CAPILLARY
Glucose-Capillary: 106 mg/dL — ABNORMAL HIGH (ref 70–99)
Glucose-Capillary: 224 mg/dL — ABNORMAL HIGH (ref 70–99)
Glucose-Capillary: 227 mg/dL — ABNORMAL HIGH (ref 70–99)

## 2013-07-03 LAB — PREPARE FRESH FROZEN PLASMA

## 2013-07-03 MED ORDER — SODIUM CHLORIDE 0.9 % IV BOLUS (SEPSIS)
1000.0000 mL | Freq: Once | INTRAVENOUS | Status: AC
  Administered 2013-07-03: 1000 mL via INTRAVENOUS

## 2013-07-03 MED ORDER — POLYETHYLENE GLYCOL 3350 17 G PO PACK
17.0000 g | PACK | Freq: Three times a day (TID) | ORAL | Status: DC
  Administered 2013-07-03: 17 g via ORAL
  Filled 2013-07-03 (×3): qty 1

## 2013-07-03 NOTE — Progress Notes (Signed)
Utilization review completed.  P.J. Marlin Brys,RN,BSN Case Manager [phone]  

## 2013-07-03 NOTE — Progress Notes (Addendum)
Pt had a syncopal episode on BSC this afternoon. Staff got Pt back to bed; supremely diaphoretic, cold & clammy, VSS. EKG was recorded.  Pt passed large amt of maroon clots & bloody fluid from rectum. RR was called & attending. 1L bolus was admin, CBC was drawn. GI consult was also briefed on events that transpired. Pt to remain on stepdown, VSS, will continue to monitor.

## 2013-07-03 NOTE — Progress Notes (Signed)
[facility] PROGRESS NOTE Subjective No gross bleeding VS stable.  Objective: Vital signs in last 24 hours: Temp:  [97.6 F (36.4 C)-98.8 F (37.1 C)] 97.6 F (36.4 C) (07/28 0722) Pulse Rate:  [78-87] 79 (07/28 0450) Resp:  [10-17] 11 (07/28 0722) BP: (101-117)/(49-64) 117/49 mmHg (07/28 0722) SpO2:  [96 %-98 %] 98 % (07/28 0722) Last BM Date: 07/02/13  Intake/Output from previous day: 07/27 0701 - 07/28 0700 In: 2770 [P.O.:120; I.V.:1600; Blood:350; IV Piggyback:700] Out: 2025 [Urine:2025] Intake/Output this shift: Total I/O In: 200 [I.V.:200] Out: 650 [Urine:650]  PE: General--alert no distress  Abdomen--soft nontender  Lab Results:  Recent Labs  07/01/13 2129 07/02/13 1230 07/02/13 2220 07/03/13 0545  WBC 18.9* 7.2 7.6 7.2  HGB 10.5* 10.5* 9.3* 9.3*  HCT 30.8* 30.3* 26.7* 26.3*  PLT 256 178 156 154   BMET  Recent Labs  07/01/13 2129 07/02/13 1230  NA 137 142  K 4.2 4.0  CL 105 110  CO2 24 21  CREATININE 1.32 1.05   LFT  Recent Labs  07/02/13 1230  PROT 4.9*  AST 11  ALT 8  ALKPHOS 45  BILITOT 0.9   PT/INR  Recent Labs  07/02/13 0115 07/02/13 1230  LABPROT 15.8* 14.7  INR 1.29 1.17   PANCREAS No results found for this basename: LIPASE,  in the last 72 hours       Studies/Results: Dg Chest Portable 1 View  07/01/2013   *RADIOLOGY REPORT*  Clinical Data: Rectal bleeding  PORTABLE CHEST - 1 VIEW  Comparison: CTA chest dated 09/18/2012  Findings: Lungs are clear. No pleural effusion or pneumothorax.  The heart is top normal in size.  Mild right perihilar/paramediastinal prominence, likely related to technique.  IMPRESSION: No evidence of acute cardiopulmonary disease.  Mild right perihilar/paramediastinal prominence, likely related to technique.  Consider follow-up PA/lateral chest radiographs for further evaluation.   Original Report Authenticated By: Charline Bills, M.D.    Medications: I have reviewed the patient's  current medications.  Assessment/Plan: 1. LGI Bleed. Probably diverticular appears to have stopped. Will likely need colonoscopy in future but would not change the outcome now. Would like to delay for several weeks to allow him to get over the [facility] repair. Will start CL diet and Miralax to clear colon.   Orlandria Kissner JR,Alysia Scism L 07/03/2013, 9:49 AM

## 2013-07-03 NOTE — Progress Notes (Signed)
[facility] PROGRESS NOTE  EASTON FETTY UJW:[id number] DOB: [birth year] DOA: 07/01/2013 PCP: [name], MD   HPI/Subjective: Feels much better, denies any abdominal discomfort. Feels much better, per GI advance to clear liquids.  Assessment/Plan:  Lower GI bleed -Significant GI bleed, status post transfusion of 4 units of packed RBCs. -Patient is on Plavix, this is held, I will not be surprised if he restarts bleeding. -Seems like the bleeding stopped for now, no bowel movement since early morning. -Likely diverticular bleed, has documented sigmoid diverticulosis. -Appreciate GI help, recommended colonoscopy as outpatient.  Hypotension -Likely secondary to hemorrhagic shock from significant GI bleed. -Status post transfusion of 4 units of pack rbc's, blood pressure is reasonable now. -Continue to hold antihypertensive medications including metoprolol and ramipril.  Leukocytosis -Likely secondary to stress de-margination. -Patient is empirically on Cipro and Flagyl, I will discontinue.  Diabetes mellitus type 2 -Patient continued to be n.p.o., ice chips Dilaudid. -Hold hypoglycemic agents, insulin sliding scale.  CAD -History of this territory ago, patient is on Plavix. -Hold Plavix for now, hold metoprolol and Altace for low blood pressure. -I see no indication for plasma transfusion I will discontinue it.  Code Status: Full code Family Communication: Plan discussed with the patient Disposition Plan: Remains inpatient   Consultants:  Gastroenterology  Procedures:  None  Antibiotics:  None   Objective: Filed Vitals:   07/02/13 2255 07/03/13 0450 07/03/13 0722 07/03/13 1225  BP: 101/61 110/54 117/49   Pulse: 78 79    Temp: 98.1 F (36.7 C) 98 F (36.7 C) 97.6 F (36.4 C) 97.8 F (36.6 C)  TempSrc: Oral Oral Oral Oral  Resp: 11 17 11    Height:      Weight:      SpO2: 97% 98% 98%     Intake/Output Summary (Last 24 hours) at  07/03/13 1340 Last data filed at 07/03/13 0800  Gross per 24 hour  Intake   2300 ml  Output   2175 ml  Net    125 ml   Filed Weights   07/02/13 0112  Weight: 104.2 kg (229 lb 11.5 oz)    Exam: General: Alert and awake, oriented x3, not in any acute distress. HEENT: anicteric sclera, pupils reactive to light and accommodation, EOMI CVS: S1-S2 clear, no murmur rubs or gallops Chest: clear to auscultation bilaterally, no wheezing, rales or rhonchi Abdomen: soft nontender, nondistended, normal bowel sounds, no organomegaly Extremities: no cyanosis, clubbing or edema noted bilaterally Neuro: Cranial nerves II-XII intact, no focal neurological deficits  Data Reviewed: Basic Metabolic Panel:  Recent Labs Lab 07/01/13 2129 07/02/13 1230  NA 137 142  K 4.2 4.0  CL 105 110  CO2 24 21  GLUCOSE 254* 130*  BUN 21 21  CREATININE 1.32 1.05  CALCIUM 8.0* 7.6*   Liver Function Tests:  Recent Labs Lab 07/02/13 1230  AST 11  ALT 8  ALKPHOS 45  BILITOT 0.9  PROT 4.9*  ALBUMIN 2.6*   No results found for this basename: LIPASE, AMYLASE,  in the last 168 hours No results found for this basename: AMMONIA,  in the last 168 hours CBC:  Recent Labs Lab 07/01/13 2129 07/02/13 1230 07/02/13 2220 07/03/13 0545 07/03/13 1245  WBC 18.9* 7.2 7.6 7.2 7.2  NEUTROABS 15.6*  --   --   --   --   HGB 10.5* 10.5* 9.3* 9.3* 9.3*  HCT 30.8* 30.3* 26.7* 26.3* 27.4*  MCV 94.8 89.6 89.9 89.8 90.1  PLT 256 178 156 154  162   Cardiac Enzymes: No results found for this basename: CKTOTAL, CKMB, CKMBINDEX, TROPONINI,  in the last 168 hours BNP (last 3 results) No results found for this basename: PROBNP,  in the last 8760 hours CBG:  Recent Labs Lab 07/02/13 2030 07/02/13 2256 07/03/13 0447 07/03/13 0721 07/03/13 1217  GLUCAP 134* 107* 106* 126* 157*    Recent Results (from the past 240 hour(s))  MRSA PCR SCREENING     Status: None   Collection Time    07/02/13  3:01 AM      Result  Value Range Status   MRSA by PCR NEGATIVE  NEGATIVE Final   Comment:            The GeneXpert MRSA Assay (FDA     approved for NASAL specimens     only), is one component of a     comprehensive MRSA colonization     surveillance program. It is not     intended to diagnose MRSA     infection nor to guide or     monitor treatment for     MRSA infections.  CULTURE, BLOOD (ROUTINE X 2)     Status: None   Collection Time    07/02/13 12:40 PM      Result Value Range Status   Specimen Description BLOOD RIGHT ARM   Final   Special Requests BOTTLES DRAWN AEROBIC AND ANAEROBIC 10CC   Final   Culture  Setup Time 07/02/2013 20:30   Final   Culture     Final   Value:        BLOOD CULTURE RECEIVED NO GROWTH TO DATE CULTURE WILL BE HELD FOR 5 DAYS BEFORE ISSUING A FINAL NEGATIVE REPORT   Report Status PENDING   Incomplete  CULTURE, BLOOD (ROUTINE X 2)     Status: None   Collection Time    07/02/13 12:45 PM      Result Value Range Status   Specimen Description BLOOD RIGHT HAND   Final   Special Requests BOTTLES DRAWN AEROBIC ONLY 10CC   Final   Culture  Setup Time 07/02/2013 20:35   Final   Culture     Final   Value:        BLOOD CULTURE RECEIVED NO GROWTH TO DATE CULTURE WILL BE HELD FOR 5 DAYS BEFORE ISSUING A FINAL NEGATIVE REPORT   Report Status PENDING   Incomplete     Studies: Dg Chest Portable 1 View  07/01/2013   *RADIOLOGY REPORT*  Clinical Data: Rectal bleeding  PORTABLE CHEST - 1 VIEW  Comparison: CTA chest dated 09/18/2012  Findings: Lungs are clear. No pleural effusion or pneumothorax.  The heart is top normal in size.  Mild right perihilar/paramediastinal prominence, likely related to technique.  IMPRESSION: No evidence of acute cardiopulmonary disease.  Mild right perihilar/paramediastinal prominence, likely related to technique.  Consider follow-up PA/lateral chest radiographs for further evaluation.   Original Report Authenticated By: Charline Bills, M.D.    Scheduled  Meds: . ciprofloxacin  400 mg Intravenous Q12H  . insulin aspart  0-9 Units Subcutaneous Q4H  . metronidazole  500 mg Intravenous Q8H  . polyethylene glycol  17 g Oral TID  . sodium chloride  3 mL Intravenous Q12H   Continuous Infusions: . sodium chloride 100 mL/hr at 07/03/13 0800    Principal Problem:   Acute lower GI bleeding Active Problems:   CAD, NATIVE VESSEL   Abdominal pain    Time spent: 35  minutes  [facility]  [facility] Pager [phone]. If 7PM-7AM, please contact night-coverage at [email or web address], password [facility] 07/03/2013, 1:40 PM  LOS: 2 days

## 2013-07-03 NOTE — Significant Event (Signed)
Rapid Response Event Note  Overview:Called to assist with patient with syncope on BSC Time Called: 1600 Arrival Time: 1606 Event Type: Hypotension  Initial Focused Assessment:  Alert in bed on arrival - pale - clammy - oriented - c/o abd cramping.  BP 143/68  HR 109 ST RR 22 O2 sats 94% on RA.  Large BRB BM with large maroon clots passed while in bed on my arrival - patient states he feels the cramping and gets clammy and dizzy - abd soft - right ingunianl hernia incision clean and intact.  Patient denies nausea.  NS infusing.  No CP or SOB per patient.     Interventions:  NS bolus started - cleaned and repositioned.  Placed on 2 liter nasal cannula.  Call to lab for stat CBC.  Dr. Arthor Captain updated.  Dr. Randa Evens with GI updated.  Wife and daughter updated.  Concerns addressed.  Frequent vital signs for next hour.  Patient resting - no more abd pain.  BP 110/60  HR 90 SR - RR 18 O2 sats 100%.  No CP or SOB.  Will watch.  Handoff to [employer].   Event Summary: Name of Physician Notified: Dr. Arthor Captain at  [facility], [place])  Name of Consulting Physician Notified: Dr. Randa Evens at 1645  Outcome: Stayed in room and stabalized  Event End Time: 1715  [place]

## 2013-07-04 ENCOUNTER — Encounter (HOSPITAL_COMMUNITY): Payer: Self-pay | Admitting: General Surgery

## 2013-07-04 DIAGNOSIS — K922 Gastrointestinal hemorrhage, unspecified: Secondary | ICD-10-CM

## 2013-07-04 DIAGNOSIS — D62 Acute posthemorrhagic anemia: Secondary | ICD-10-CM | POA: Diagnosis present

## 2013-07-04 LAB — GLUCOSE, CAPILLARY
Glucose-Capillary: 241 mg/dL — ABNORMAL HIGH (ref 70–99)
Glucose-Capillary: 234 mg/dL — ABNORMAL HIGH (ref 70–99)

## 2013-07-04 LAB — CBC
HCT: 22.2 % — ABNORMAL LOW (ref 39.0–52.0)
MCH: 31 pg (ref 26.0–34.0)
MCH: 29.8 pg (ref 26.0–34.0)
MCH: 30.2 pg (ref 26.0–34.0)
MCH: 30.6 pg (ref 26.0–34.0)
MCHC: 34.3 g/dL (ref 30.0–36.0)
MCV: 89.5 fL (ref 78.0–100.0)
MCV: 88.7 fL (ref 78.0–100.0)
Platelets: 179 10*3/uL (ref 150–400)
Platelets: 165 10*3/uL (ref 150–400)
RBC: 2.48 MIL/uL — ABNORMAL LOW (ref 4.22–5.81)
RBC: 2.45 MIL/uL — ABNORMAL LOW (ref 4.22–5.81)
RDW: 14.7 % (ref 11.5–15.5)
RDW: 14.2 % (ref 11.5–15.5)
RDW: 14.7 % (ref 11.5–15.5)
RDW: 14.9 % (ref 11.5–15.5)
WBC: 11.3 10*3/uL — ABNORMAL HIGH (ref 4.0–10.5)
WBC: 14.1 10*3/uL — ABNORMAL HIGH (ref 4.0–10.5)

## 2013-07-04 LAB — BASIC METABOLIC PANEL
BUN: 18 mg/dL (ref 6–23)
Creatinine, Ser: 1.03 mg/dL (ref 0.50–1.35)
GFR calc Af Amer: 81 mL/min — ABNORMAL LOW (ref >90)
GFR calc non Af Amer: 69 mL/min — ABNORMAL LOW (ref >90)

## 2013-07-04 LAB — PREPARE RBC (CROSSMATCH)

## 2013-07-04 MED ORDER — WHITE PETROLATUM GEL
Status: AC
  Administered 2013-07-04: 13:00:00
  Filled 2013-07-04: qty 5

## 2013-07-04 MED ORDER — ONDANSETRON HCL 4 MG/2ML IJ SOLN: 4.0000 mg | Freq: Four times a day (QID) | INTRAMUSCULAR | Status: DC | PRN

## 2013-07-04 MED ORDER — ONDANSETRON HCL 4 MG/2ML IJ SOLN
INTRAMUSCULAR | Status: AC
  Administered 2013-07-04: 4 mg via INTRAVENOUS
  Filled 2013-07-04: qty 2

## 2013-07-04 MED ORDER — SODIUM CHLORIDE 0.9 % IV BOLUS (SEPSIS)
1000.0000 mL | Freq: Once | INTRAVENOUS | Status: AC
  Administered 2013-07-04: 1000 mL via INTRAVENOUS

## 2013-07-04 NOTE — Consult Note (Signed)
Timothy [name] [birth year]  [id number].   Requesting MD: Dr. Carman Ching Chief Complaint/Reason for Consult: GI bleed HPI: This is a 72 yo male who just underwent an elective right inguinal hernia repair last Thursday by Dr. Darnell Level (06-29-13).  He had held his plavix for 5 days prior to that.  He resumed this on Friday and Saturday after surgery.  On Saturday, he began having multiple episodes of hematochezia with a syncopal episode at home.  He was brought to [facility] where he was admitted.  He stopped late Saturday night and into Sunday.  He rebled on Monday as well.  He has received around 8 units of pRBCs since being here.  His last BM was this morning which was still clotted, but more maroon in color c/w old blood.  He has had no intervention or workup to localize his bleeding since being here.  He had a colonoscopy 4 years ago that said he had some left sided diverticulosis.  We have been asked to see the patient for further evaluation.  Review of Systems: Please see HPI, otherwise all other systems are negative.  Family History  Problem Relation Age of Onset  . Cancer Brother     colon cancer    Past Medical History  Diagnosis Date  . Coronary artery disease     NSTEMI 2001, 2003   . Hypertension   . Diabetes mellitus   . History of MI (myocardial infarction)   . Hyperlipidemia   . CAD (coronary artery disease)   . Syncope, carotid sinus   . Inguinal hernia     Past Surgical History  Procedure Laterality Date  . None    . Rotator cuff repair  2013    lt  . Eye surgery  2013    Cataracts-both  . Colonoscopy    . Cardiac catheterization  00,03    stents both times  . Inguinal hernia repair Right 06/29/2013    Procedure: HERNIA REPAIR INGUINAL ADULT;  Surgeon: [name], MD;  Location: [facility];  Service: General;  Laterality: Right;  . Insertion of mesh Right 06/29/2013    Procedure: INSERTION OF MESH;  Surgeon: [name], MD;  Location: [facility];  Service: General;  Laterality: Right;    Social History:  reports that he has never smoked. He does not have any smokeless tobacco history on file. He reports that he does not drink alcohol or use illicit drugs.  Allergies: No Known Allergies  Medications Prior to Admission  Medication Sig Dispense Refill  . acetaminophen (TYLENOL) 500 MG tablet Take 1,000 mg by mouth every 6 (six) hours as needed for pain.      Marland Kitchen clopidogrel (PLAVIX) 75 MG tablet Take 75 mg by mouth daily.      Marland Kitchen docusate sodium (COLACE) 100 MG capsule Take 200 mg by mouth daily as needed for constipation.      Marland Kitchen glimepiride (AMARYL) 4 MG tablet Take 8 mg by mouth daily before breakfast.      . loratadine (CLARITIN) 10 MG tablet Take 10 mg by mouth daily.       . metFORMIN (GLUCOPHAGE) 1000 MG tablet Take 1,000-1,500 mg by mouth See admin instructions. Takes 1 tablet with breakfast and 1.5 tablets with supper.      . metoprolol tartrate (LOPRESSOR) 25 MG tablet Take 25 mg by mouth 2 (two) times daily.      . pravastatin (PRAVACHOL) 40 MG tablet Take 40  mg by mouth every evening.      . ramipril (ALTACE) 10 MG capsule Take 10 mg by mouth daily.      . sitaGLIPtin (JANUVIA) 100 MG tablet Take 100 mg by mouth daily.      . traMADol (ULTRAM) 50 MG tablet Take 50 mg by mouth every 6 (six) hours as needed for pain.        Blood pressure 92/54, pulse 107, temperature 98.2 F (36.8 C), temperature source Oral, resp. rate 15, height 6\' 2"  (1.88 m), weight 229 lb 11.5 oz (104.2 kg), SpO2 100.00%. Physical Exam: General: pleasant, WD, WN white male who is laying in bed in NAD HEENT: head is normocephalic, atraumatic.  Sclera are noninjected.  PERRL.  Ears and nose without any masses or lesions.  Mouth is pink and moist Heart: regular, rate, and rhythm.  Normal s1,s2. No obvious murmurs, gallops, or rubs noted.  Palpable radial and pedal pulses bilaterally Lungs: CTAB, no wheezes, rhonchi, or rales noted.   Respiratory effort nonlabored Abd: soft, NT, except minimal in his RLQ around his hernia incision.  Incision is c/d/i with dermabond present.  No ecchymosis or erythema. ND, +BS, no masses, hernias, or organomegaly MS: all 4 extremities are symmetrical with no cyanosis, clubbing, or edema. Skin: warm and dry with no masses, lesions, or rashes, very pale from anemia Psych: A&Ox3 with an appropriate affect.    Results for orders placed during the hospital encounter of 07/01/13 (from the past 48 hour(s))  COMPREHENSIVE METABOLIC PANEL     Status: Abnormal   Collection Time    07/02/13 12:30 PM      Result Value Range   Sodium 142  135 - 145 mEq/L   Potassium 4.0  3.5 - 5.1 mEq/L   Chloride 110  96 - 112 mEq/L   CO2 21  19 - 32 mEq/L   Glucose, Bld 130 (*) 70 - 99 mg/dL   BUN 21  6 - 23 mg/dL   Creatinine, Ser 1.61  0.50 - 1.35 mg/dL   Calcium 7.6 (*) 8.4 - 10.5 mg/dL   Total Protein 4.9 (*) 6.0 - 8.3 g/dL   Albumin 2.6 (*) 3.5 - 5.2 g/dL   AST 11  0 - 37 U/L   ALT 8  0 - 53 U/L   Alkaline Phosphatase 45  39 - 117 U/L   Total Bilirubin 0.9  0.3 - 1.2 mg/dL   GFR calc non Af Amer 68 (*) >90 mL/min   GFR calc Af Amer 79 (*) >90 mL/min   Comment:            The eGFR has been calculated     using the CKD EPI equation.     This calculation has not been     validated in all clinical     situations.     eGFR's persistently     <90 mL/min signify     possible Chronic Kidney Disease.  PROTIME-INR     Status: None   Collection Time    07/02/13 12:30 PM      Result Value Range   Prothrombin Time 14.7  11.6 - 15.2 seconds   INR 1.17  0.00 - 1.49  APTT     Status: None   Collection Time    07/02/13 12:30 PM      Result Value Range   aPTT 27  24 - 37 seconds  LACTIC ACID, PLASMA     Status: None   Collection  Time    07/02/13 12:30 PM      Result Value Range   Lactic Acid, Venous 1.0  0.5 - 2.2 mmol/L  CBC     Status: Abnormal   Collection Time    07/02/13 12:30 PM      Result  Value Range   WBC 7.2  4.0 - 10.5 K/uL   RBC 3.38 (*) 4.22 - 5.81 MIL/uL   Hemoglobin 10.5 (*) 13.0 - 17.0 g/dL   HCT 16.1 (*) 09.6 - 04.5 %   MCV 89.6  78.0 - 100.0 fL   MCH 31.1  26.0 - 34.0 pg   MCHC 34.7  30.0 - 36.0 g/dL   RDW 40.9  81.1 - 91.4 %   Platelets 178  150 - 400 K/uL   Comment: DELTA CHECK NOTED     REPEATED TO VERIFY  CULTURE, BLOOD (ROUTINE X 2)     Status: None   Collection Time    07/02/13 12:40 PM      Result Value Range   Specimen Description BLOOD RIGHT ARM     Special Requests BOTTLES DRAWN AEROBIC AND ANAEROBIC 10CC     Culture  Setup Time 07/02/2013 20:30     Culture       Value:        BLOOD CULTURE RECEIVED NO GROWTH TO DATE CULTURE WILL BE HELD FOR 5 DAYS BEFORE ISSUING A FINAL NEGATIVE REPORT   Report Status PENDING    CULTURE, BLOOD (ROUTINE X 2)     Status: None   Collection Time    07/02/13 12:45 PM      Result Value Range   Specimen Description BLOOD RIGHT HAND     Special Requests BOTTLES DRAWN AEROBIC ONLY 10CC     Culture  Setup Time 07/02/2013 20:35     Culture       Value:        BLOOD CULTURE RECEIVED NO GROWTH TO DATE CULTURE WILL BE HELD FOR 5 DAYS BEFORE ISSUING A FINAL NEGATIVE REPORT   Report Status PENDING    GLUCOSE, CAPILLARY     Status: Abnormal   Collection Time    07/02/13  4:06 PM      Result Value Range   Glucose-Capillary 127 (*) 70 - 99 mg/dL   Comment 1 Notify RN    GLUCOSE, CAPILLARY     Status: Abnormal   Collection Time    07/02/13  8:30 PM      Result Value Range   Glucose-Capillary 134 (*) 70 - 99 mg/dL   Comment 1 Notify RN    CBC     Status: Abnormal   Collection Time    07/02/13 10:20 PM      Result Value Range   WBC 7.6  4.0 - 10.5 K/uL   RBC 2.97 (*) 4.22 - 5.81 MIL/uL   Hemoglobin 9.3 (*) 13.0 - 17.0 g/dL   HCT 78.2 (*) 95.6 - 21.3 %   MCV 89.9  78.0 - 100.0 fL   MCH 31.3  26.0 - 34.0 pg   MCHC 34.8  30.0 - 36.0 g/dL   RDW 08.6  57.8 - 46.9 %   Platelets 156  150 - 400 K/uL  GLUCOSE, CAPILLARY      Status: Abnormal   Collection Time    07/02/13 10:56 PM      Result Value Range   Glucose-Capillary 107 (*) 70 - 99 mg/dL  GLUCOSE, CAPILLARY     Status: Abnormal  Collection Time    07/03/13  4:47 AM      Result Value Range   Glucose-Capillary 106 (*) 70 - 99 mg/dL  CBC     Status: Abnormal   Collection Time    07/03/13  5:45 AM      Result Value Range   WBC 7.2  4.0 - 10.5 K/uL   RBC 2.93 (*) 4.22 - 5.81 MIL/uL   Hemoglobin 9.3 (*) 13.0 - 17.0 g/dL   HCT 16.1 (*) 09.6 - 04.5 %   MCV 89.8  78.0 - 100.0 fL   MCH 31.7  26.0 - 34.0 pg   MCHC 35.4  30.0 - 36.0 g/dL   RDW 40.9  81.1 - 91.4 %   Platelets 154  150 - 400 K/uL  GLUCOSE, CAPILLARY     Status: Abnormal   Collection Time    07/03/13  7:21 AM      Result Value Range   Glucose-Capillary 126 (*) 70 - 99 mg/dL   Comment 1 Documented in Chart     Comment 2 Notify RN    GLUCOSE, CAPILLARY     Status: Abnormal   Collection Time    07/03/13 12:17 PM      Result Value Range   Glucose-Capillary 157 (*) 70 - 99 mg/dL  CBC     Status: Abnormal   Collection Time    07/03/13 12:45 PM      Result Value Range   WBC 7.2  4.0 - 10.5 K/uL   RBC 3.04 (*) 4.22 - 5.81 MIL/uL   Hemoglobin 9.3 (*) 13.0 - 17.0 g/dL   HCT 78.2 (*) 95.6 - 21.3 %   MCV 90.1  78.0 - 100.0 fL   MCH 30.6  26.0 - 34.0 pg   MCHC 33.9  30.0 - 36.0 g/dL   RDW 08.6  57.8 - 46.9 %   Platelets 162  150 - 400 K/uL  GLUCOSE, CAPILLARY     Status: Abnormal   Collection Time    07/03/13  3:40 PM      Result Value Range   Glucose-Capillary 224 (*) 70 - 99 mg/dL  CBC     Status: Abnormal   Collection Time    07/03/13  4:35 PM      Result Value Range   WBC 10.1  4.0 - 10.5 K/uL   RBC 2.91 (*) 4.22 - 5.81 MIL/uL   Hemoglobin 9.1 (*) 13.0 - 17.0 g/dL   HCT 62.9 (*) 52.8 - 41.3 %   MCV 89.0  78.0 - 100.0 fL   MCH 31.3  26.0 - 34.0 pg   MCHC 35.1  30.0 - 36.0 g/dL   RDW 24.4  01.0 - 27.2 %   Platelets 209  150 - 400 K/uL  GLUCOSE, CAPILLARY     Status:  Abnormal   Collection Time    07/03/13  8:33 PM      Result Value Range   Glucose-Capillary 227 (*) 70 - 99 mg/dL  GLUCOSE, CAPILLARY     Status: Abnormal   Collection Time    07/04/13 12:22 AM      Result Value Range   Glucose-Capillary 188 (*) 70 - 99 mg/dL  BASIC METABOLIC PANEL     Status: Abnormal   Collection Time    07/04/13  2:23 AM      Result Value Range   Sodium 137  135 - 145 mEq/L   Potassium 4.2  3.5 - 5.1 mEq/L   Chloride  108  96 - 112 mEq/L   CO2 18 (*) 19 - 32 mEq/L   Glucose, Bld 213 (*) 70 - 99 mg/dL   BUN 18  6 - 23 mg/dL   Creatinine, Ser 1.61  0.50 - 1.35 mg/dL   Calcium 6.9 (*) 8.4 - 10.5 mg/dL   GFR calc non Af Amer 69 (*) >90 mL/min   GFR calc Af Amer 81 (*) >90 mL/min   Comment:            The eGFR has been calculated     using the CKD EPI equation.     This calculation has not been     validated in all clinical     situations.     eGFR's persistently     <90 mL/min signify     possible Chronic Kidney Disease.  CBC     Status: Abnormal   Collection Time    07/04/13  2:23 AM      Result Value Range   WBC 9.7  4.0 - 10.5 K/uL   RBC 1.97 (*) 4.22 - 5.81 MIL/uL   Hemoglobin 6.1 (*) 13.0 - 17.0 g/dL   Comment: REPEATED TO VERIFY     CRITICAL RESULT CALLED TO, READ BACK BY AND VERIFIED WITH:     MATT YORK [id number] GREEN R     DELTA CHECK NOTED     GI BLEED     MATT YORK RN   HCT 17.8 (*) 39.0 - 52.0 %   MCV 90.4  78.0 - 100.0 fL   MCH 31.0  26.0 - 34.0 pg   MCHC 34.3  30.0 - 36.0 g/dL   RDW 40.9  81.1 - 91.4 %   Platelets 188  150 - 400 K/uL  GLUCOSE, CAPILLARY     Status: Abnormal   Collection Time    07/04/13  3:25 AM      Result Value Range   Glucose-Capillary 218 (*) 70 - 99 mg/dL  CBC     Status: Abnormal   Collection Time    07/04/13  6:28 AM      Result Value Range   WBC 11.3 (*) 4.0 - 10.5 K/uL   RBC 2.48 (*) 4.22 - 5.81 MIL/uL   Hemoglobin 7.4 (*) 13.0 - 17.0 g/dL   Comment: POST TRANSFUSION SPECIMEN   HCT 22.2 (*) 39.0  - 52.0 %   MCV 89.5  78.0 - 100.0 fL   MCH 29.8  26.0 - 34.0 pg   MCHC 33.3  30.0 - 36.0 g/dL   RDW 78.2  95.6 - 21.3 %   Platelets 161  150 - 400 K/uL  GLUCOSE, CAPILLARY     Status: Abnormal   Collection Time    07/04/13  7:33 AM      Result Value Range   Glucose-Capillary 217 (*) 70 - 99 mg/dL   Comment 1 Documented in Chart     Comment 2 Notify RN    CBC     Status: Abnormal   Collection Time    07/04/13 10:26 AM      Result Value Range   WBC 14.1 (*) 4.0 - 10.5 K/uL   RBC 2.45 (*) 4.22 - 5.81 MIL/uL   Hemoglobin 7.4 (*) 13.0 - 17.0 g/dL   HCT 08.6 (*) 57.8 - 46.9 %   MCV 89.0  78.0 - 100.0 fL   MCH 30.2  26.0 - 34.0 pg   MCHC 33.9  30.0 - 36.0 g/dL  RDW 14.7  11.5 - 15.5 %   Platelets 179  150 - 400 K/uL  PREPARE RBC (CROSSMATCH)     Status: None   Collection Time    07/04/13 11:34 AM      Result Value Range   Order Confirmation ORDER PROCESSED BY BLOOD BANK     No results found.     Assessment/Plan 1. GI bleed, suspect lower 2. ABL anemia 3. S/p right inguinal hernia repair 4. CAD, on plavix for DES 10+ years ago 5. DM 6. HTN  Plan: 1. Before anything surgical can be done, the patient needs localization of his bleeding.  He was noted to have diverticulosis of his left colon on a colonoscopy done 4 years ago.  Currently, he has had no further workup to confirm this may be the site of bleeding.  If he rebleeds, he would neither either a colonoscopy, nuc med bleeding scan, or if bleeding fast enough a possible angiogram.  2 of these would potentially give the ability to correct the bleeding process without even having to do an operation.  However, if this was unable to be corrected, if we could get a localization, then surgical intervention may be beneficial.  For now we will follow along, but would recommend some type of STAT action if he rebleeds to attempt localization and treatment of his bleeding. 2. Transfuse as needed, per primary team. 3. Will  follow.  Shatiqua Heroux E 07/04/2013, 12:27 PM Pager: [phone]

## 2013-07-04 NOTE — Progress Notes (Addendum)
Over course of night/morning pt had several bloody BMs (see flow sheets). Pt developed worsening hypotension, dizziness, and during BMs vagaled down causing pt to pass out twice in bed. K. Craige Cotta of [facility] notified, AM labs drawn early, Hgb of 6.1 reported back, and 2 units PRBCs and 2 Liters NS infused. BPs now more stable and Hgb 7.4 @0630  (value conveyed to Donnamarie Poag). Pt currently very pale and anemic in appearance. Rapid Response aware and following.

## 2013-07-04 NOTE — Consult Note (Signed)
Recommend localization study as above if he re-bleeds. Likely diverticular source so hopefully it will not start bleeding again.  I discussed the plan with him and his daughter.  He is unwilling to have any surgery that would leave him with a colostomy. Patient examined and I agree with the assessment and plan  Violeta Gelinas, MD, MPH, FACS Pager: [phone]  07/04/2013 2:01 PM

## 2013-07-04 NOTE — Progress Notes (Signed)
Inpatient Diabetes Program Recommendations  AACE/ADA: New Consensus Statement on Inpatient Glycemic Control (2013)  Target Ranges:  Prepandial:   less than 140 mg/dL      Peak postprandial:   less than 180 mg/dL (1-2 hours)      Critically ill patients:  140 - 180 mg/dL   Reason for Visit: Results for HARIS, BAACK (MRN [id number]) as of 07/04/2013 12:23  Ref. Range 07/03/2013 15:40 07/03/2013 20:33 07/04/2013 00:22 07/04/2013 03:25 07/04/2013 07:33  Glucose-Capillary Latest Range: 70-99 mg/dL 782 (H) 956 (H) 213 (H) 218 (H) 217 (H)   Please increase correction to moderate q 4 hours.

## 2013-07-04 NOTE — Progress Notes (Signed)
[facility] PROGRESS NOTE  Timothy [name] FAO:[id number] DOB: September 17, 1941 DOA: 07/01/2013 PCP: [name], MD   HPI/Subjective: Had multiple episodes of bleeding overnight. Hb dropped to 6.1 and had 2 units transfused. He blames the Miralax as a cause of he his rebleeding. I had log discussion with him with his wife and daughter present at bed side. Wife writing down everything. I believe it is reasonable to observe him off of the Plavix at least for 5-6 days. Support with transfusion as needed. Keep in SDU, watch closely  Assessment/Plan:  Lower GI bleed -Significant GI bleed, status post transfusion of 4 units of packed RBCs.  -Likely diverticular bleed, has documented sigmoid diverticulosis. -Patient is on Plavix, this is held, more bleeding, await Plavix to wear off of her system. -Seems like the bleeding stopped for now, no bowel movement since early morning. -Appreciate GI help, Gen surgery to evaluate.  Anemia -Acute blood loss anemia 2/2 GI bleed. -Transfuse to keep hemoglobin more than 8.0. -Still marginally hypotensive with SBP in the 90s. -Transfuse 2 more units of pRBCs, total is going to be 8 units  Hypotension -Likely secondary to hemorrhagic shock from significant GI bleed. -Status post transfusion of 4 units of pack rbc's, blood pressure is reasonable now. -Continue to hold antihypertensive medications including metoprolol and ramipril.  Leukocytosis -Likely secondary to stress de-margination, resolved. -Patient is empirically on Cipro and Flagyl, I will discontinue.  Diabetes mellitus type 2 -Patient continued to be n.p.o., ice chips. -Hold hypoglycemic agents, insulin sliding scale.  CAD -History of this territory ago, patient is on Plavix. -Hold Plavix for now, hold metoprolol and Altace for low blood pressure. -I see no indication for plasma transfusion I will discontinue it.  Code Status: Full code Family Communication: Plan discussed  with the patient, wife and Dtr Disposition Plan: Remains inpatient.   Consultants:  Gastroenterology  Procedures:  None  Antibiotics:  None   Objective: Filed Vitals:   07/04/13 0536 07/04/13 0600 07/04/13 0630 07/04/13 0700  BP: 104/54 107/54 90/55 96/53   Pulse: 99 109 104 105  Temp: 97.9 F (36.6 C)   98.2 F (36.8 C)  TempSrc: Oral   Oral  Resp: 15 13 11 14   Height:      Weight:      SpO2:  100% 100% 100%    Intake/Output Summary (Last 24 hours) at 07/04/13 1105 Last data filed at 07/04/13 0700  Gross per 24 hour  Intake 3606.67 ml  Output   1325 ml  Net 2281.67 ml   Filed Weights   07/02/13 0112  Weight: 104.2 kg (229 lb 11.5 oz)    Exam: General: Alert and awake, oriented x3, not in any acute distress. HEENT: anicteric sclera, pupils reactive to light and accommodation, EOMI CVS: S1-S2 clear, no murmur rubs or gallops Chest: clear to auscultation bilaterally, no wheezing, rales or rhonchi Abdomen: soft nontender, nondistended, normal bowel sounds, no organomegaly Extremities: no cyanosis, clubbing or edema noted bilaterally Neuro: Cranial nerves II-XII intact, no focal neurological deficits  Data Reviewed: Basic Metabolic Panel:  Recent Labs Lab 07/01/13 2129 07/02/13 1230 07/04/13 0223  NA 137 142 137  K 4.2 4.0 4.2  CL 105 110 108  CO2 24 21 18*  GLUCOSE 254* 130* 213*  BUN 21 21 18   CREATININE 1.32 1.05 1.03  CALCIUM 8.0* 7.6* 6.9*   Liver Function Tests:  Recent Labs Lab 07/02/13 1230  AST 11  ALT 8  ALKPHOS 45  BILITOT 0.9  PROT  4.9*  ALBUMIN 2.6*   No results found for this basename: LIPASE, AMYLASE,  in the last 168 hours No results found for this basename: AMMONIA,  in the last 168 hours CBC:  Recent Labs Lab 07/01/13 2129  07/03/13 0545 07/03/13 1245 07/03/13 1635 07/04/13 0223 07/04/13 0628  WBC 18.9*  < > 7.2 7.2 10.1 9.7 11.3*  NEUTROABS 15.6*  --   --   --   --   --   --   HGB 10.5*  < > 9.3* 9.3* 9.1*  6.1* 7.4*  HCT 30.8*  < > 26.3* 27.4* 25.9* 17.8* 22.2*  MCV 94.8  < > 89.8 90.1 89.0 90.4 89.5  PLT 256  < > 154 162 209 188 161  < > = values in this interval not displayed. Cardiac Enzymes: No results found for this basename: CKTOTAL, CKMB, CKMBINDEX, TROPONINI,  in the last 168 hours BNP (last 3 results) No results found for this basename: PROBNP,  in the last 8760 hours CBG:  Recent Labs Lab 07/03/13 1540 07/03/13 2033 07/04/13 0022 07/04/13 0325 07/04/13 0733  GLUCAP 224* 227* 188* 218* 217*    Recent Results (from the past 240 hour(s))  MRSA PCR SCREENING     Status: None   Collection Time    07/02/13  3:01 AM      Result Value Range Status   MRSA by PCR NEGATIVE  NEGATIVE Final   Comment:            The GeneXpert MRSA Assay (FDA     approved for NASAL specimens     only), is one component of a     comprehensive MRSA colonization     surveillance program. It is not     intended to diagnose MRSA     infection nor to guide or     monitor treatment for     MRSA infections.  CULTURE, BLOOD (ROUTINE X 2)     Status: None   Collection Time    07/02/13 12:40 PM      Result Value Range Status   Specimen Description BLOOD RIGHT ARM   Final   Special Requests BOTTLES DRAWN AEROBIC AND ANAEROBIC 10CC   Final   Culture  Setup Time 07/02/2013 20:30   Final   Culture     Final   Value:        BLOOD CULTURE RECEIVED NO GROWTH TO DATE CULTURE WILL BE HELD FOR 5 DAYS BEFORE ISSUING A FINAL NEGATIVE REPORT   Report Status PENDING   Incomplete  CULTURE, BLOOD (ROUTINE X 2)     Status: None   Collection Time    07/02/13 12:45 PM      Result Value Range Status   Specimen Description BLOOD RIGHT HAND   Final   Special Requests BOTTLES DRAWN AEROBIC ONLY 10CC   Final   Culture  Setup Time 07/02/2013 20:35   Final   Culture     Final   Value:        BLOOD CULTURE RECEIVED NO GROWTH TO DATE CULTURE WILL BE HELD FOR 5 DAYS BEFORE ISSUING A FINAL NEGATIVE REPORT   Report Status  PENDING   Incomplete     Studies: No results found.  Scheduled Meds: . insulin aspart  0-9 Units Subcutaneous Q4H  . sodium chloride  3 mL Intravenous Q12H   Continuous Infusions: . sodium chloride 150 mL/hr at 07/04/13 0416    Principal Problem:   Acute lower GI bleeding  Active Problems:   CAD, NATIVE VESSEL   Abdominal pain    Time spent: 35  minutes    [facility]  [facility] Pager [phone]. If 7PM-7AM, please contact night-coverage at [email or web address], password [facility] 07/04/2013, 11:05 AM  LOS: 3 days

## 2013-07-04 NOTE — Progress Notes (Signed)
[facility] PROGRESS NOTE Subjective patient had bleeding episode yesterday evening after having bowel movement. He is very angry that he had another episode of bleeding after he had stop bleeding. He blames Miralax for causing him to read bleed and adamantly refuses to take any more laxatives. He had a dark stool around 6 AM and has had no further passage of any bright red blood or dark stools since. He is on ice chips. His hemoglobin dropped from 9.1 to 6.1 and he received 2 units of PRBCs his blood pressure was 110 systolic yesterday and is not a systolic now. He and his wife are monitoring all of his vital signs, labs, and keeping a record in a notebook. He denies any abdominal pain.   Objective: Vital signs in last 24 hours: Temp:  [97.8 F (36.6 C)-98.9 F (37.2 C)] 98.2 F (36.8 C) (07/29 0700) Pulse Rate:  [76-114] 105 (07/29 0700) Resp:  [9-21] 14 (07/29 0700) BP: (83-116)/(36-65) 96/53 mmHg (07/29 0700) SpO2:  [98 %-100 %] 100 % (07/29 0700) Last BM Date: 07/02/13  Intake/Output from previous day: 07/28 0701 - 07/29 0700 In: 3806.7 [P.O.:720; I.V.:2386.7; Blood:700] Out: 1975 [Urine:1975] Intake/Output this shift:    PE: General-- patient laying in bed in no acute distress Heart-- Lungs--clear Abdomen-- soft and nontender with normal bowel sounds  Lab Results:  Recent Labs  07/03/13 0545 07/03/13 1245 07/03/13 1635 07/04/13 0223 07/04/13 0628  WBC 7.2 7.2 10.1 9.7 11.3*  HGB 9.3* 9.3* 9.1* 6.1* 7.4*  HCT 26.3* 27.4* 25.9* 17.8* 22.2*  PLT 154 162 209 188 161   BMET  Recent Labs  07/01/13 2129 07/02/13 1230 07/04/13 0223  NA 137 142 137  K 4.2 4.0 4.2  CL 105 110 108  CO2 24 21 18*  CREATININE 1.32 1.05 1.03   LFT  Recent Labs  07/02/13 1230  PROT 4.9*  AST 11  ALT 8  ALKPHOS 45  BILITOT 0.9   PT/INR  Recent Labs  07/02/13 0115 07/02/13 1230  LABPROT 15.8* 14.7  INR 1.29 1.17   PANCREAS No results found for this  basename: LIPASE,  in the last 72 hours       Studies/Results: No results found.  Medications: I have reviewed the patient's current medications.  Assessment/Plan: 1. Lower G.I. bleeding. Probably diverticular. Long discussion with patient and his wife about diverticular bleeding. He blames Miralax for causing him to rebleed and adamantly denies any constipation. Stated that he did not believe constipation or hard stool had anything to do with his bleeding. He does have known diverticulosis on the left side of his: from previous colonoscopy. This is his 2nd significant diverticular bleed. Long discussion with them about the treatment options including conservative measures i.e. avoidance of constipation, which he rejects completely, angiogram with embolization mesenteric vessel, acute surgery which would likely be a subtotal colectomy. He stated to me that he would rather die than have a subtotal colectomy. We talked about the possibility that he could stop bleeding completely and then if that was the case he may want to consider elective or section of the sigmoid colon since he is now had 2 significant lower G.I. bleeds. Currently, he appears to be stable.  Plan: the patient refuses any Miralax for other laxatives since he feels this is what has caused him to bleed. I would go ahead and keep him on ice chips and hold clear liquids for 24 hours in case he really bleeds again. If he really bleeds, I  think the next step would be mesenteric angiogram with embolization. I will go ahead and ask the surgery service to meet with him now while he is stable to discuss emergency colectomy as well as elective colectomy if he decides to do that in the future.   Brentney Goldbach JR,Aunna Snooks L 07/04/2013, 9:38 AM

## 2013-07-05 ENCOUNTER — Encounter (HOSPITAL_COMMUNITY): Payer: Self-pay | Admitting: Radiology

## 2013-07-05 ENCOUNTER — Inpatient Hospital Stay (HOSPITAL_COMMUNITY): Payer: Medicare Other

## 2013-07-05 DIAGNOSIS — R578 Other shock: Secondary | ICD-10-CM

## 2013-07-05 HISTORY — DX: Other shock: R57.8

## 2013-07-05 LAB — CBC
HCT: 21.9 % — ABNORMAL LOW (ref 39.0–52.0)
Hemoglobin: 7.2 g/dL — ABNORMAL LOW (ref 13.0–17.0)
MCH: 30.9 pg (ref 26.0–34.0)
MCH: 32.1 pg (ref 26.0–34.0)
MCHC: 34.7 g/dL (ref 30.0–36.0)
MCHC: 35.8 g/dL (ref 30.0–36.0)
MCV: 89.7 fL (ref 78.0–100.0)
MCV: 88 fL (ref 78.0–100.0)
Platelets: 92 10*3/uL — ABNORMAL LOW (ref 150–400)
RBC: 2.66 MIL/uL — ABNORMAL LOW (ref 4.22–5.81)
RDW: 15 % (ref 11.5–15.5)
RDW: 14.8 % (ref 11.5–15.5)
WBC: 11.4 10*3/uL — ABNORMAL HIGH (ref 4.0–10.5)

## 2013-07-05 LAB — TYPE AND SCREEN
Unit division: 0
Unit division: 0
Unit division: 0

## 2013-07-05 LAB — GLUCOSE, CAPILLARY
Glucose-Capillary: 216 mg/dL — ABNORMAL HIGH (ref 70–99)
Glucose-Capillary: 157 mg/dL — ABNORMAL HIGH (ref 70–99)
Glucose-Capillary: 265 mg/dL — ABNORMAL HIGH (ref 70–99)

## 2013-07-05 LAB — PREPARE RBC (CROSSMATCH)

## 2013-07-05 MED ORDER — MIDAZOLAM HCL 2 MG/2ML IJ SOLN
INTRAMUSCULAR | Status: AC | PRN
  Administered 2013-07-05 (×5): 1 mg via INTRAVENOUS

## 2013-07-05 MED ORDER — SODIUM CHLORIDE 0.9 % IV BOLUS (SEPSIS)
1000.0000 mL | Freq: Once | INTRAVENOUS | Status: AC
  Administered 2013-07-05: 1000 mL via INTRAVENOUS

## 2013-07-05 MED ORDER — FENTANYL CITRATE 0.05 MG/ML IJ SOLN
INTRAMUSCULAR | Status: AC
  Filled 2013-07-05: qty 4

## 2013-07-05 MED ORDER — BIOTENE DRY MOUTH MT LIQD
15.0000 mL | Freq: Two times a day (BID) | OROMUCOSAL | Status: DC
  Administered 2013-07-06 – 2013-07-13 (×13): 15 mL via OROMUCOSAL

## 2013-07-05 MED ORDER — WHITE PETROLATUM GEL
Status: AC
  Administered 2013-07-06: 0.2
  Filled 2013-07-05: qty 5

## 2013-07-05 MED ORDER — CHLORHEXIDINE GLUCONATE 0.12 % MT SOLN
15.0000 mL | Freq: Two times a day (BID) | OROMUCOSAL | Status: DC
  Administered 2013-07-06 – 2013-07-13 (×14): 15 mL via OROMUCOSAL
  Filled 2013-07-05 (×18): qty 15

## 2013-07-05 MED ORDER — SODIUM CHLORIDE 0.9 % IJ SOLN
INTRAMUSCULAR | Status: AC
  Filled 2013-07-05: qty 40

## 2013-07-05 MED ORDER — IOHEXOL 300 MG/ML  SOLN
150.0000 mL | Freq: Once | INTRAMUSCULAR | Status: AC | PRN
  Administered 2013-07-05: 180 mL via INTRA_ARTERIAL

## 2013-07-05 MED ORDER — IOHEXOL 350 MG/ML SOLN
100.0000 mL | Freq: Once | INTRAVENOUS | Status: AC | PRN
  Administered 2013-07-05: 100 mL via INTRAVENOUS

## 2013-07-05 MED ORDER — FENTANYL CITRATE 0.05 MG/ML IJ SOLN
INTRAMUSCULAR | Status: AC | PRN
  Administered 2013-07-05 (×4): 50 ug via INTRAVENOUS

## 2013-07-05 MED ORDER — MIDAZOLAM HCL 2 MG/2ML IJ SOLN
INTRAMUSCULAR | Status: AC
  Filled 2013-07-05: qty 6

## 2013-07-05 NOTE — Consult Note (Signed)
PULMONARY  / CRITICAL CARE MEDICINE  Name: Timothy [name] MRN: [id number] DOB: 10-05-1941    ADMISSION DATE:  07/01/2013 CONSULTATION DATE:  07/05/2013    REFERRING MD :  Dr. Thedore Mins, [facility]: [facility]  CHIEF COMPLAINT:  GI Bleeding  BRIEF PATIENT DESCRIPTION: 72 year old male with history of extensive diverticulosis, s/p recent right inguinal hernia repair (06/29/13), CAD s/p stenting (DES and BES) in 2003, DM-2, and HTN with persistent lower GI bleed and hypotension.  CCM consulted for evaluation and central line placement.  SIGNIFICANT EVENTS / STUDIES:  7/27 - Admission 7/27 - GI consulted; Supportive care and transfusion (as needed). 7/29 - Surgery consulted for evaluation. No intervention recommended as exact source of bleeding unclear. 7/30 - Additional lower GI bleed and hypotension.  LINES / TUBES: Right IJ 7/30 >>>  CULTURES: 7/27 Blood Cx >>> Negative  ANTIBIOTICS: None  HISTORY OF PRESENT ILLNESS:   72 year old male with recent right inguinal hernia repair (06/29/13), CAD s/p stenting (DES and BES) in 2003, DM-2, and HTN who presented with BRBPR.  Hb on initial presentation was 10.5.  Patient also noted to have hypotension on admission as well.  During hospitalization, patient had additional bloody BM's and Hb trended downward to 6.1 with intermittent hypotension.  Patient has received at total of 8 units of PRBC's.  This afternoon at approximately 4 PM patient had an additional bloody stool (melena).  Patient went on to have 3 additional melanotic stools and became diaphoretic and hypotensive (BP of 71/34).  CCM called for evaluation and cental line placement.  4 additional Units of PRBC's as well as FFP and platelets ordered.  Patient will be going to IR for bleeding study.    PAST MEDICAL HISTORY :  Past Medical History  Diagnosis Date  . Coronary artery disease     NSTEMI 2001, 2003   . Hypertension   . Diabetes mellitus   . History of MI  (myocardial infarction)   . Hyperlipidemia   . CAD (coronary artery disease)   . Syncope, carotid sinus   . Inguinal hernia    Past Surgical History  Procedure Laterality Date  . None    . Rotator cuff repair  2013    lt  . Eye surgery  2013    Cataracts-both  . Colonoscopy    . Cardiac catheterization  00,03    stents both times  . Inguinal hernia repair Right 06/29/2013    Procedure: HERNIA REPAIR INGUINAL ADULT;  Surgeon: [name], MD;  Location: [facility];  Service: General;  Laterality: Right;  . Insertion of mesh Right 06/29/2013    Procedure: INSERTION OF MESH;  Surgeon: [name], MD;  Location: [facility];  Service: General;  Laterality: Right;   Prior to Admission medications   Medication Sig Start Date End Date Taking? Authorizing Provider  acetaminophen (TYLENOL) 500 MG tablet Take 1,000 mg by mouth every 6 (six) hours as needed for pain.   Yes Historical Provider, MD  clopidogrel (PLAVIX) 75 MG tablet Take 75 mg by mouth daily.   Yes Historical Provider, MD  docusate sodium (COLACE) 100 MG capsule Take 200 mg by mouth daily as needed for constipation.   Yes Historical Provider, MD  glimepiride (AMARYL) 4 MG tablet Take 8 mg by mouth daily before breakfast.   Yes Historical Provider, MD  loratadine (CLARITIN) 10 MG tablet Take 10 mg by mouth daily.    Yes Historical  Provider, MD  metFORMIN (GLUCOPHAGE) 1000 MG tablet Take 1,000-1,500 mg by mouth See admin instructions. Takes 1 tablet with breakfast and 1.5 tablets with supper.   Yes Historical Provider, MD  metoprolol tartrate (LOPRESSOR) 25 MG tablet Take 25 mg by mouth 2 (two) times daily.   Yes Historical Provider, MD  pravastatin (PRAVACHOL) 40 MG tablet Take 40 mg by mouth every evening.   Yes Historical Provider, MD  ramipril (ALTACE) 10 MG capsule Take 10 mg by mouth daily. 09/30/11 06/12/14 Yes Kathleene Hazel, MD  sitaGLIPtin (JANUVIA) 100 MG tablet Take 100 mg by mouth  daily.   Yes Historical Provider, MD  traMADol (ULTRAM) 50 MG tablet Take 50 mg by mouth every 6 (six) hours as needed for pain.   Yes Historical Provider, MD   No Known Allergies  FAMILY HISTORY:  Family History  Problem Relation Age of Onset  . Cancer Brother     colon cancer   SOCIAL HISTORY:  reports that he has never smoked. He does not have any smokeless tobacco history on file. He reports that he does not drink alcohol or use illicit drugs.  REVIEW OF SYSTEMS:  Patient reporting weakness/fatigue, palpitations and recent bleeding. Denies current SOB, chest pain, Abdominal pain.    SUBJECTIVE:  Feeling very weak and tired Denies current chest pain, SOB Overt BRBPR now filling bedpan  VITAL SIGNS: Temp:  [97.9 F (36.6 C)-98.6 F (37 C)] 98.3 F (36.8 C) (07/30 1544) Pulse Rate:  [79-108] 103 (07/30 1745) Resp:  [11-20] 16 (07/30 1745) BP: (71-122)/(34-60) 71/34 mmHg (07/30 1745) SpO2:  [96 %-100 %] 100 % (07/30 1745) HEMODYNAMICS: BP 80/40 Tachy   2L [place]    INTAKE / OUTPUT: Intake/Output     07/29 0701 - 07/30 0700 07/30 0701 - 07/31 0700   P.O. 480 620   I.V. (mL/kg) 4100 (39.3)    Blood 472.5    Total Intake(mL/kg) 5052.5 (48.5) 620 (6)   Urine (mL/kg/hr) 1475 (0.6) 1150 (1)   Total Output 1475 1150   Net +3577.5 -530          PHYSICAL EXAMINATION: General:  Extremely pale, ill appearing gentlemen. In NAD currently on [place] O2 Neuro:  Alert and oriented x 3. No focal findings.  HEENT:  NCAT. Cardiovascular:  Tachycardia.  Regular rhythm. No m/r/g Lungs: CTAB. No rales, rhonchi, or wheezing. Abdomen: soft, nontender, nondistended. + BS Musculoskeletal:  Trace LE edema Skin:  Intact, dry; Very pale  LABS:  Recent Labs Lab 07/01/13 2129 07/01/13 2214 07/02/13 0115 07/02/13 1230  07/04/13 0223  07/04/13 2019 07/05/13 0507 07/05/13 1129  HGB 10.5*  --   --  10.5*  < > 6.1*  < > 9.2* 7.6* 7.2*  WBC 18.9*  --   --  7.2  < > 9.7  < > 12.3* 9.7 10.3   PLT 256  --   --  178  < > 188  < > 165 142* 129*  NA 137  --   --  142  --  137  --   --   --   --   K 4.2  --   --  4.0  --  4.2  --   --   --   --   CL 105  --   --  110  --  108  --   --   --   --   CO2 24  --   --  21  --  18*  --   --   --   --   GLUCOSE 254*  --   --  130*  --  213*  --   --   --   --   BUN 21  --   --  21  --  18  --   --   --   --   CREATININE 1.32  --   --  1.05  --  1.03  --   --   --   --   CALCIUM 8.0*  --   --  7.6*  --  6.9*  --   --   --   --   AST  --   --   --  11  --   --   --   --   --   --   ALT  --   --   --  8  --   --   --   --   --   --   ALKPHOS  --   --   --  45  --   --   --   --   --   --   BILITOT  --   --   --  0.9  --   --   --   --   --   --   PROT  --   --   --  4.9*  --   --   --   --   --   --   ALBUMIN  --   --   --  2.6*  --   --   --   --   --   --   APTT  --   --  26 27  --   --   --   --   --   --   INR  --   --  1.29 1.17  --   --   --   --   --   --   LATICACIDVEN  --  2.67*  --  1.0  --   --   --   --   --   --   < > = values in this interval not displayed.  Recent Labs Lab 07/04/13 2019 07/05/13 0034 07/05/13 0448 07/05/13 0800 07/05/13 1209  GLUCAP 234* 216* 187* 157* 145*    ASSESSMENT / PLAN:  PULMONARY A: No active issues P:   Supplemental [place] Oxygen as needed May yet need intubation if CV worsens  CARDIOVASCULAR A: CAD s/p stenting, Hx of HTN, Current Hypotension secondary to acute blood loss P:  Holding home antihypertensives in the setting of hypotension Holding Plavix in the setting of active bleeding Transfuse up to Hgb 9.0 RENAL A:  No active issues P:   Continuing IV fluids - NS @ 150 mL/hr Daily BMP   GASTROINTESTINAL A:  Lower GI bleed, presumed Diverticular bleed given history, Acute blood loss anemia P:   S/p 8 Units PRBC Holding Plavix Q8H CBC GI and surgery following Last Hb 7.2, but patient with additional bleeding and hypotension.  Patient to receive 4 units PRBC's, FFP, and  Platelets.  Patient going to IR for bleeding study and embolization. Will be going to 2100 following.  HEMATOLOGIC A:  Acute blood loss anemia secondary to acute lower GI bleed P:  CBC's Q8 hour See GI section for further details  INFECTIOUS A:  No infectious source. P:  Cultures as above No need for Abx at this time  ENDOCRINE A:  DM-2  P:   CBG Q4 SSI, Sensitive  NEUROLOGIC A:  No active issues. Patient AO x 3. Patient being transfused currently and going to IR immediately to find and embolize bleeding source.  TODAY'S SUMMARY: Central line placed.  Patient going to IR to find bleeding source and embolization. Need to keep ahead of blood and f/u labs  I have personally obtained a history, examined the patient, evaluated laboratory and imaging results, formulated the assessment and plan and placed orders. CRITICAL CARE: The patient is critically ill with multiple organ systems failure and requires high complexity decision making for assessment and support, frequent evaluation and titration of therapies, application of advanced monitoring technologies and extensive interpretation of multiple databases. Critical Care Time devoted to patient care services described in this note is 45 minutes.  Everlene Other DO Family Medicine PGY-2   I have seen and examined this pt with Tommy Rainwater and agree with the above note. [name] Beeper  [phone]  Cell  [phone]  If no response or cell goes to voicemail, call beeper [phone]

## 2013-07-05 NOTE — Progress Notes (Signed)
Patient ID: CHE RACHAL, male   DOB: 01-26-1941, 72 y.o.   MRN: [id number] Angio neg for bleed. CT angio shows only scattered colonic diverticulae. Pt refuses any surgery unless bleed can be localized. Will continue resuscitation. GI may need to scope.

## 2013-07-05 NOTE — Procedures (Signed)
Celiac, SMA, IMA ANGIOGRAMS NEGATIVE FOR ACUTE GI BLEEDING NO COMP STABLE D/W DR Carolynne Edouard

## 2013-07-05 NOTE — Progress Notes (Signed)
MD's at bedside, BP continues to drop, rapid response at bedside

## 2013-07-05 NOTE — Progress Notes (Addendum)
  Subjective: Passing gas, no blood PR, no abdominal pain  Objective: Vital signs in last 24 hours: Temp:  [97.6 F (36.4 C)-98.9 F (37.2 C)] 98.2 F (36.8 C) (07/30 0720) Pulse Rate:  [79-126] 86 (07/30 0720) Resp:  [11-20] 12 (07/30 0720) BP: (88-121)/(50-66) 108/57 mmHg (07/30 0720) SpO2:  [97 %-100 %] 97 % (07/30 0720) Last BM Date: 07/02/13  Intake/Output from previous day: 07/29 0701 - 07/30 0700 In: 5052.5 [P.O.:480; I.V.:4100; Blood:472.5] Out: 1475 [Urine:1475] Intake/Output this shift:    General appearance: alert and cooperative Resp: clear to auscultation bilaterally Cardio: regular rate and rhythm GI: soft, NT, ND, +BS  Lab Results:   Recent Labs  07/04/13 2019 07/05/13 0507  WBC 12.3* 9.7  HGB 9.2* 7.6*  HCT 26.7* 21.9*  PLT 165 142*   BMET  Recent Labs  07/02/13 1230 07/04/13 0223  NA 142 137  K 4.0 4.2  CL 110 108  CO2 21 18*  GLUCOSE 130* 213*  BUN 21 18  CREATININE 1.05 1.03  CALCIUM 7.6* 6.9*   PT/INR  Recent Labs  07/02/13 1230  LABPROT 14.7  INR 1.17   ABG No results found for this basename: PHART, PCO2, PO2, HCO3,  in the last 72 hours  Studies/Results: No results found.  Anti-infectives: Anti-infectives   Start     Dose/Rate Route Frequency Ordered Stop   07/02/13 0600  ciprofloxacin (CIPRO) IVPB 400 mg  Status:  Discontinued     400 mg 200 mL/hr over 60 Minutes Intravenous Every 12 hours 07/02/13 0137 07/03/13 1343   07/02/13 0145  metroNIDAZOLE (FLAGYL) IVPB 500 mg  Status:  Discontinued     500 mg 100 mL/hr over 60 Minutes Intravenous Every 8 hours 07/02/13 0137 07/03/13 1343      Assessment/Plan: s/p * No surgery found * LGIB - likely diverticular, seems to have stopped. I suspect Hb is at least in part due to dilution with IVF rate. Would advance diet.  Recommend imaging study if re-bleeds.  GI F/U noted.  LOS: 4 days    Giulian Goldring E 07/05/2013

## 2013-07-05 NOTE — ED Notes (Signed)
Patient denies pain and is resting comfortably.  

## 2013-07-05 NOTE — Progress Notes (Signed)
[facility] PROGRESS NOTE Subjective no gross bleeding overnight. Hemoglobin to drop 9.2 to 7.6 the patient is not passed any stool. Denies abdominal pain. He remains NPO   Objective: Vital signs in last 24 hours: Temp:  [97.6 F (36.4 C)-98.9 F (37.2 C)] 98.2 F (36.8 C) (07/30 0720) Pulse Rate:  [79-126] 85 (07/30 0800) Resp:  [11-20] 18 (07/30 0800) BP: (88-121)/(49-66) 111/49 mmHg (07/30 0800) SpO2:  [96 %-100 %] 96 % (07/30 0800) Last BM Date: 07/04/13  Intake/Output from previous day: 07/29 0701 - 07/30 0700 In: 5052.5 [P.O.:480; I.V.:4100; Blood:472.5] Out: 1475 [Urine:1475] Intake/Output this shift:    PE: General-- patient alert in no distress  Abdomen-- soft and nontender  Lab Results:  Recent Labs  07/04/13 0223 07/04/13 0628 07/04/13 1026 07/04/13 2019 07/05/13 0507  WBC 9.7 11.3* 14.1* 12.3* 9.7  HGB 6.1* 7.4* 7.4* 9.2* 7.6*  HCT 17.8* 22.2* 21.8* 26.7* 21.9*  PLT 188 161 179 165 142*   BMET  Recent Labs  07/02/13 1230 07/04/13 0223  NA 142 137  K 4.0 4.2  CL 110 108  CO2 21 18*  CREATININE 1.05 1.03   LFT  Recent Labs  07/02/13 1230  PROT 4.9*  AST 11  ALT 8  ALKPHOS 45  BILITOT 0.9   PT/INR  Recent Labs  07/02/13 1230  LABPROT 14.7  INR 1.17   PANCREAS No results found for this basename: LIPASE,  in the last 72 hours       Studies/Results: No results found.  Medications: I have reviewed the patient's current medications.  Assessment/Plan: 1. Lower G.I. bleeding. Almost certainly due to diverticulosis. Patient has dropped his hemoglobin but there is no clinical bleeding. I will go ahead and resume clear liquids. He still refuses Miralax at this time. Continue to monitor hemoglobin and signs of clinical bleeding. If he does rebleed, would be bleeding scan or angiogram urgently.   Angeline Trick JR,Caspian Deleonardis L 07/05/2013, 11:45 AM

## 2013-07-05 NOTE — Progress Notes (Signed)
Dr Thedore Mins notified pt has had 2 Large Dark bloody BM's in past 45 min and is on bedpan again.  Dr. Janee Morn and Randa Evens paged per Dr Thedore Mins request

## 2013-07-05 NOTE — Progress Notes (Addendum)
[facility]                                                                                Patient Demographics  Timothy [name], is a 72 y.o. male, DOB - Jan 10, 1941, YQM:[id number], BMW:[id number]  Admit date - 07/01/2013  Admitting Physician Jonah Blue, DO  Outpatient Primary MD for the patient is [name], MD  LOS - 4   Chief Complaint  Patient presents with  . Rectal Bleeding        Assessment & Plan    1.Acute lower GI bleeding with Acute blood loss anemia - bleeding appears to be diverticular, GI and general surgery following the patient, he status post 4 units packed RBC transfusion so far, and dinner to monitor H&H , goal to keep hemoglobin above 7, if he bleeds again I will order tract RBC scan, if it is brisk enough will order angiogram. Discussed with both GI and general surgery on 07/05/2013.  Addendum 4.51pm -  was paged at 4.43 pt now actively bleeding, 1 lit NS bolus, 2 units PRBCs over 1 hr each stat, called IR for STat angiogram, will inform GI and CCS too.   5.25pm - PCCM monitoring the patient closely D/W Dr Vassie Loll.   6.33 pm - 1st unit PRBC half done, BP better, C Line placed by PCCM, they will take over, D/W Dr Delford Field.     2. History of CAD with drug-eluting stents several years ago, was on Plavix which is held. No cardiac issues.   3. Hypotension due to hemorrhagic shock. Better after transfusion and IV fluids, hold antihypertensive medications and monitor.   4. DM type II. On insulin sliding scale oral hypoglycemics on hold.  Lab Results  Component Value Date   HGBA1C  Value: 6.7 (NOTE) The ADA recommends the following therapeutic goal for glycemic control related to Hgb A1c measurement: Goal of therapy: <6.5 Hgb A1c  Reference: American Diabetes Association: Clinical Practice Recommendations 2010, Diabetes Care, 2010, 33: (Suppl  1).* 01/20/2010    CBG (last 3)   Recent Labs  07/05/13 0034 07/05/13 0448 07/05/13 0800   GLUCAP 216* 187* 157*       Code Status: full  Family Communication: wife  Disposition Plan: TBD   Procedures     Consults  GI, CCS   DVT Prophylaxis   SCDs    Lab Results  Component Value Date   PLT 129* 07/05/2013    Medications  Scheduled Meds: . insulin aspart  0-9 Units Subcutaneous Q4H  . sodium chloride  3 mL Intravenous Q12H   Continuous Infusions: . sodium chloride 150 mL/hr at 07/05/13 0731   PRN Meds:.ondansetron  Antibiotics     Anti-infectives   Start     Dose/Rate Route Frequency Ordered Stop   07/02/13 0600  ciprofloxacin (CIPRO) IVPB 400 mg  Status:  Discontinued     400 mg 200 mL/hr over 60 Minutes Intravenous Every 12 hours 07/02/13 0137 07/03/13 1343   07/02/13 0145  metroNIDAZOLE (FLAGYL) IVPB 500 mg  Status:  Discontinued     500 mg 100 mL/hr over 60 Minutes Intravenous Every 8 hours 07/02/13 0137 07/03/13 1343  Time Spent in minutes  40   SINGH,PRASHANT K M.D on 07/05/2013 at 1:05 PM  Between 7am to 7pm - Pager - [phone]  After 7pm go to [email or web address] - password [email or web address]  And look for the night coverage person covering for me after hours  [facility] Office  [phone]    Subjective:   Timothy [name] today has, No headache, No chest pain, No abdominal pain - No Nausea, No new weakness tingling or numbness, No Cough - SOB.    Objective:   Filed Vitals:   07/05/13 0720 07/05/13 0800 07/05/13 1210 07/05/13 1212  BP: 108/57 111/49 122/49   Pulse: 86 85 92   Temp: 98.2 F (36.8 C)   98.6 F (37 C)  TempSrc: Oral   Oral  Resp: 12 18 20    Height:      Weight:      SpO2: 97% 96% 98%     Wt Readings from Last 3 Encounters:  07/02/13 104.2 kg (229 lb 11.5 oz)  06/29/13 104.101 kg (229 lb 8 oz)  06/29/13 104.101 kg (229 lb 8 oz)     Intake/Output Summary (Last 24 hours) at 07/05/13 1305 Last data filed at 07/05/13 1200  Gross per 24 hour  Intake   5000 ml  Output   2075 ml  Net   2925 ml     Exam Awake Alert, Oriented X 3, No new F.N deficits, Normal affect [place].AT,PERRAL Supple Neck,No JVD, No cervical lymphadenopathy appriciated.  Symmetrical Chest wall movement, Good air movement bilaterally, CTAB RRR,No Gallops,Rubs or new Murmurs, No Parasternal Heave +ve B.Sounds, Abd Soft, Non tender, No organomegaly appriciated, No rebound - guarding or rigidity. No Cyanosis, Clubbing or edema, No new Rash or bruise      Data Review   Micro Results Recent Results (from the past 240 hour(s))  MRSA PCR SCREENING     Status: None   Collection Time    07/02/13  3:01 AM      Result Value Range Status   MRSA by PCR NEGATIVE  NEGATIVE Final   Comment:            The GeneXpert MRSA Assay (FDA     approved for NASAL specimens     only), is one component of a     comprehensive MRSA colonization     surveillance program. It is not     intended to diagnose MRSA     infection nor to guide or     monitor treatment for     MRSA infections.  CULTURE, BLOOD (ROUTINE X 2)     Status: None   Collection Time    07/02/13 12:40 PM      Result Value Range Status   Specimen Description BLOOD RIGHT ARM   Final   Special Requests BOTTLES DRAWN AEROBIC AND ANAEROBIC 10CC   Final   Culture  Setup Time 07/02/2013 20:30   Final   Culture     Final   Value:        BLOOD CULTURE RECEIVED NO GROWTH TO DATE CULTURE WILL BE HELD FOR 5 DAYS BEFORE ISSUING A FINAL NEGATIVE REPORT   Report Status PENDING   Incomplete  CULTURE, BLOOD (ROUTINE X 2)     Status: None   Collection Time    07/02/13 12:45 PM      Result Value Range Status   Specimen Description BLOOD RIGHT HAND   Final   Special Requests BOTTLES DRAWN AEROBIC ONLY 10CC  Final   Culture  Setup Time 07/02/2013 20:35   Final   Culture     Final   Value:        BLOOD CULTURE RECEIVED NO GROWTH TO DATE CULTURE WILL BE HELD FOR 5 DAYS BEFORE ISSUING A FINAL NEGATIVE REPORT   Report Status PENDING   Incomplete    Radiology Reports Dg Chest  Portable 1 View  07/01/2013   *RADIOLOGY REPORT*  Clinical Data: Rectal bleeding  PORTABLE CHEST - 1 VIEW  Comparison: CTA chest dated 09/18/2012  Findings: Lungs are clear. No pleural effusion or pneumothorax.  The heart is top normal in size.  Mild right perihilar/paramediastinal prominence, likely related to technique.  IMPRESSION: No evidence of acute cardiopulmonary disease.  Mild right perihilar/paramediastinal prominence, likely related to technique.  Consider follow-up PA/lateral chest radiographs for further evaluation.   Original Report Authenticated By: Charline Bills, M.D.    CBC  Recent Labs Lab 07/01/13 2129  07/04/13 1610 07/04/13 1026 07/04/13 2019 07/05/13 0507 07/05/13 1129  WBC 18.9*  < > 11.3* 14.1* 12.3* 9.7 10.3  HGB 10.5*  < > 7.4* 7.4* 9.2* 7.6* 7.2*  HCT 30.8*  < > 22.2* 21.8* 26.7* 21.9* 20.1*  PLT 256  < > 161 179 165 142* 129*  MCV 94.8  < > 89.5 89.0 88.7 89.0 89.7  MCH 32.3  < > 29.8 30.2 30.6 30.9 32.1  MCHC 34.1  < > 33.3 33.9 34.5 34.7 35.8  RDW 13.4  < > 14.2 14.7 14.9 15.0 15.1  LYMPHSABS 1.6  --   --   --   --   --   --   MONOABS 1.3*  --   --   --   --   --   --   EOSABS 0.3  --   --   --   --   --   --   BASOSABS 0.0  --   --   --   --   --   --   < > = values in this interval not displayed.  Chemistries   Recent Labs Lab 07/01/13 2129 07/02/13 1230 07/04/13 0223  NA 137 142 137  K 4.2 4.0 4.2  CL 105 110 108  CO2 24 21 18*  GLUCOSE 254* 130* 213*  BUN 21 21 18   CREATININE 1.32 1.05 1.03  CALCIUM 8.0* 7.6* 6.9*  AST  --  11  --   ALT  --  8  --   ALKPHOS  --  45  --   BILITOT  --  0.9  --    ------------------------------------------------------------------------------------------------------------------ estimated creatinine clearance is 81 ml/min (by C-G formula based on Cr of 1.03). ------------------------------------------------------------------------------------------------------------------ No results found for this  basename: HGBA1C,  in the last 72 hours ------------------------------------------------------------------------------------------------------------------ No results found for this basename: CHOL, HDL, LDLCALC, TRIG, CHOLHDL, LDLDIRECT,  in the last 72 hours ------------------------------------------------------------------------------------------------------------------ No results found for this basename: TSH, T4TOTAL, FREET3, T3FREE, THYROIDAB,  in the last 72 hours ------------------------------------------------------------------------------------------------------------------ No results found for this basename: VITAMINB12, FOLATE, FERRITIN, TIBC, IRON, RETICCTPCT,  in the last 72 hours  Coagulation profile  Recent Labs Lab 07/02/13 0115 07/02/13 1230  INR 1.29 1.17    No results found for this basename: DDIMER,  in the last 72 hours  Cardiac Enzymes No results found for this basename: CK, CKMB, TROPONINI, MYOGLOBIN,  in the last 168 hours ------------------------------------------------------------------------------------------------------------------ No components found with this basename: POCBNP,

## 2013-07-05 NOTE — Progress Notes (Signed)
Report called to 2101 RN, plan to tx to 2101 after IR, Wife at bedside and updated by MD's

## 2013-07-05 NOTE — Significant Event (Signed)
Rapid Response Event Note  Overview: Time Called: 1730 Arrival Time: 1732 Event Type: Hypotension;Other (Comment) (GI bleed)  Initial Focused Assessment: Patient with recurrent GI bleed.  Large amount of bright red blood.  BP 70-90s, HR 90s,  RR 18  O2 sat 100% on 2L/[place]. Wife at bedside  Interventions: Multiple IV start attempts unsuccessful.  Dr Delford Field at bedside to place Central line, placement confirmed with PCXR. 4 units PRBCs transfused over 2 hours 2 units FFP given 1 unit Platelets  Patient to IR for angiogram and to CT for CTA abdomen and pelvis Plan transfer to 2100 post CTA. Patient remained alert and orient through out event.  Event Summary: Name of Physician Notified: Dr Thedore Mins, and Dr Carolynne Edouard at bedside at  [facility])  Name of Consulting Physician Notified: DR Alva/Dr Delford Field at 1735  Outcome: Transferred (Comment) (2100)  Event End Time: 1715  Marcellina Millin

## 2013-07-05 NOTE — Procedures (Signed)
Central Venous Catheter Insertion Procedure Note Timothy [name] [id number] [birth year]  Procedure: Insertion of Central Venous Catheter Indications: Drug and/or fluid administration  Procedure Details Consent: Risks of procedure as well as the alternatives and risks of each were explained to the (patient/caregiver).  Consent for procedure obtained. Time Out: Verified patient identification, verified procedure, site/side was marked, verified correct patient position, special equipment/implants available, medications/allergies/relevent history reviewed, required imaging and test results available.  Performed  Maximum sterile technique was used including antiseptics, cap, gloves, gown, hand hygiene, mask and sheet. Skin prep: Chlorhexidine; local anesthetic administered A antimicrobial bonded/coated triple lumen catheter was placed in the right internal jugular vein using the Seldinger technique. U/S used to locate the R IJ vein.  Evaluation Blood flow good Complications: No apparent complications Patient did tolerate procedure well. Chest X-ray ordered to verify placement.  CXR: normal.  Timothy [name] 07/05/2013, 6:31 PM

## 2013-07-06 ENCOUNTER — Inpatient Hospital Stay (HOSPITAL_COMMUNITY): Payer: Medicare Other | Admitting: Anesthesiology

## 2013-07-06 ENCOUNTER — Encounter (HOSPITAL_COMMUNITY): Payer: Self-pay | Admitting: Anesthesiology

## 2013-07-06 ENCOUNTER — Encounter (HOSPITAL_COMMUNITY)
Admission: EM | Disposition: A | Discharge status: Home / Self Care | Payer: Self-pay | Source: Home / Self Care | Attending: Critical Care Medicine

## 2013-07-06 ENCOUNTER — Inpatient Hospital Stay (HOSPITAL_COMMUNITY): Payer: Medicare Other

## 2013-07-06 ENCOUNTER — Encounter (HOSPITAL_COMMUNITY): Payer: Self-pay

## 2013-07-06 DIAGNOSIS — K5730 Diverticulosis of large intestine without perforation or abscess without bleeding: Secondary | ICD-10-CM

## 2013-07-06 HISTORY — PX: ESOPHAGOGASTRODUODENOSCOPY: SHX5428

## 2013-07-06 HISTORY — PX: LAPAROTOMY: SHX154

## 2013-07-06 HISTORY — PX: COLONOSCOPY: SHX5424

## 2013-07-06 LAB — PREPARE PLATELET PHERESIS

## 2013-07-06 LAB — GLUCOSE, CAPILLARY
Glucose-Capillary: 155 mg/dL — ABNORMAL HIGH (ref 70–99)
Glucose-Capillary: 186 mg/dL — ABNORMAL HIGH (ref 70–99)

## 2013-07-06 LAB — POCT I-STAT 4, (NA,K, GLUC, HGB,HCT)
HCT: 31 % — ABNORMAL LOW (ref 39.0–52.0)
Hemoglobin: 10.5 g/dL — ABNORMAL LOW (ref 13.0–17.0)
Potassium: 4.4 mEq/L (ref 3.5–5.1)
Sodium: 142 mEq/L (ref 135–145)

## 2013-07-06 LAB — URINALYSIS, ROUTINE W REFLEX MICROSCOPIC
Bilirubin Urine: NEGATIVE
Glucose, UA: >1000 mg/dL — AB
Hgb urine dipstick: NEGATIVE
Specific Gravity, Urine: 1.028 (ref 1.005–1.030)
Urobilinogen, UA: 0.2 mg/dL (ref 0.0–1.0)

## 2013-07-06 LAB — CBC
HCT: 18.8 % — ABNORMAL LOW (ref 39.0–52.0)
Hemoglobin: 6.5 g/dL — CL (ref 13.0–17.0)
Hemoglobin: 8.5 g/dL — ABNORMAL LOW (ref 13.0–17.0)
Hemoglobin: 7.3 g/dL — ABNORMAL LOW (ref 13.0–17.0)
MCH: 31.2 pg (ref 26.0–34.0)
MCH: 29.9 pg (ref 26.0–34.0)
MCHC: 35.5 g/dL (ref 30.0–36.0)
MCHC: 36.2 g/dL — ABNORMAL HIGH (ref 30.0–36.0)
MCHC: 34.6 g/dL (ref 30.0–36.0)
RBC: 2.13 MIL/uL — ABNORMAL LOW (ref 4.22–5.81)
RBC: 2.85 MIL/uL — ABNORMAL LOW (ref 4.22–5.81)
RDW: 15.2 % (ref 11.5–15.5)
RDW: 15.6 % — ABNORMAL HIGH (ref 11.5–15.5)
WBC: 8 10*3/uL (ref 4.0–10.5)
WBC: 7.2 10*3/uL (ref 4.0–10.5)

## 2013-07-06 LAB — BASIC METABOLIC PANEL
Calcium: 6.7 mg/dL — ABNORMAL LOW (ref 8.4–10.5)
Creatinine, Ser: 1 mg/dL (ref 0.50–1.35)
GFR calc Af Amer: 83 mL/min — ABNORMAL LOW (ref >90)
GFR calc non Af Amer: 72 mL/min — ABNORMAL LOW (ref >90)
Sodium: 141 mEq/L (ref 135–145)

## 2013-07-06 LAB — PREPARE FRESH FROZEN PLASMA
Unit division: 0
Unit division: 0

## 2013-07-06 LAB — URINE MICROSCOPIC-ADD ON

## 2013-07-06 LAB — PREPARE RBC (CROSSMATCH)

## 2013-07-06 SURGERY — LAPAROTOMY, EXPLORATORY
Anesthesia: General | Site: Abdomen | Laterality: Right | Wound class: Clean Contaminated

## 2013-07-06 SURGERY — COLONOSCOPY
Anesthesia: Moderate Sedation

## 2013-07-06 MED ORDER — TECHNETIUM TC 99M-LABELED RED BLOOD CELLS IV KIT
25.0000 | PACK | Freq: Once | INTRAVENOUS | Status: AC | PRN
  Administered 2013-07-06: 25 via INTRAVENOUS

## 2013-07-06 MED ORDER — KCL IN DEXTROSE-NACL 20-5-0.45 MEQ/L-%-% IV SOLN
INTRAVENOUS | Status: DC
  Administered 2013-07-06 (×2): via INTRAVENOUS
  Administered 2013-07-07: 1000 mL via INTRAVENOUS
  Administered 2013-07-07: 21:00:00 via INTRAVENOUS
  Administered 2013-07-08: 1000 mL via INTRAVENOUS
  Administered 2013-07-11 – 2013-07-12 (×2): via INTRAVENOUS
  Filled 2013-07-06 (×14): qty 1000

## 2013-07-06 MED ORDER — DIPHENHYDRAMINE HCL 50 MG/ML IJ SOLN
INTRAMUSCULAR | Status: AC
  Filled 2013-07-06: qty 1

## 2013-07-06 MED ORDER — FENTANYL CITRATE 0.05 MG/ML IJ SOLN
INTRAMUSCULAR | Status: DC | PRN
  Administered 2013-07-06: 100 ug via INTRAVENOUS
  Administered 2013-07-06: 50 ug via INTRAVENOUS

## 2013-07-06 MED ORDER — OXYCODONE HCL 5 MG/5ML PO SOLN: 5.0000 mg | Freq: Once | ORAL | Status: DC | PRN

## 2013-07-06 MED ORDER — PEG 3350-KCL-NA BICARB-NACL 420 G PO SOLR
4000.0000 mL | Freq: Once | ORAL | Status: AC
  Administered 2013-07-06: 4000 mL via ORAL
  Filled 2013-07-06: qty 4000

## 2013-07-06 MED ORDER — FENTANYL CITRATE 0.05 MG/ML IJ SOLN: 25.0000 ug | INTRAMUSCULAR | Status: DC | PRN

## 2013-07-06 MED ORDER — BUTAMBEN-TETRACAINE-BENZOCAINE 2-2-14 % EX AERO
INHALATION_SPRAY | CUTANEOUS | Status: DC | PRN
  Administered 2013-07-06: 2 via TOPICAL

## 2013-07-06 MED ORDER — ONDANSETRON HCL 4 MG/2ML IJ SOLN: 4.0000 mg | Freq: Once | INTRAMUSCULAR | Status: AC | PRN

## 2013-07-06 MED ORDER — ALBUMIN HUMAN 5 % IV SOLN
INTRAVENOUS | Status: DC | PRN
  Administered 2013-07-06: 22:00:00 via INTRAVENOUS

## 2013-07-06 MED ORDER — POTASSIUM CHLORIDE 10 MEQ/50ML IV SOLN
10.0000 meq | INTRAVENOUS | Status: AC
Start: 2013-07-06 — End: 2013-07-06
  Administered 2013-07-06 (×2): 10 meq via INTRAVENOUS
  Filled 2013-07-06: qty 100

## 2013-07-06 MED ORDER — PROPOFOL 10 MG/ML IV EMUL
5.0000 ug/kg/min | INTRAVENOUS | Status: DC
  Administered 2013-07-06: 50 ug/kg/min via INTRAVENOUS
  Administered 2013-07-07: 45 ug/kg/min via INTRAVENOUS
  Filled 2013-07-06 (×3): qty 100

## 2013-07-06 MED ORDER — PROPOFOL 10 MG/ML IV BOLUS
INTRAVENOUS | Status: DC | PRN
  Administered 2013-07-06: 100 mg via INTRAVENOUS

## 2013-07-06 MED ORDER — FENTANYL CITRATE 0.05 MG/ML IJ SOLN
INTRAMUSCULAR | Status: DC | PRN
  Administered 2013-07-06 (×6): 12.5 ug via INTRAVENOUS

## 2013-07-06 MED ORDER — FENTANYL CITRATE 0.05 MG/ML IJ SOLN
INTRAMUSCULAR | Status: AC
  Filled 2013-07-06: qty 2

## 2013-07-06 MED ORDER — PANTOPRAZOLE SODIUM 40 MG IV SOLR
40.0000 mg | Freq: Two times a day (BID) | INTRAVENOUS | Status: DC
  Administered 2013-07-06 – 2013-07-07 (×2): 40 mg via INTRAVENOUS
  Filled 2013-07-06 (×4): qty 40

## 2013-07-06 MED ORDER — MIDAZOLAM HCL 5 MG/ML IJ SOLN
INTRAMUSCULAR | Status: AC
  Filled 2013-07-06: qty 2

## 2013-07-06 MED ORDER — POTASSIUM CHLORIDE 10 MEQ/50ML IV SOLN: 10.0000 meq | INTRAVENOUS | Status: DC

## 2013-07-06 MED ORDER — DEXTROSE 5 % IV SOLN
2.0000 g | Freq: Once | INTRAVENOUS | Status: AC
  Administered 2013-07-06: 2 g via INTRAVENOUS
  Filled 2013-07-06: qty 2

## 2013-07-06 MED ORDER — LIDOCAINE HCL (CARDIAC) 20 MG/ML IV SOLN
INTRAVENOUS | Status: DC | PRN
  Administered 2013-07-06: 70 mg via INTRAVENOUS

## 2013-07-06 MED ORDER — LACTATED RINGERS IV SOLN
INTRAVENOUS | Status: DC | PRN
  Administered 2013-07-06: 21:00:00 via INTRAVENOUS

## 2013-07-06 MED ORDER — MIDAZOLAM HCL 5 MG/ML IJ SOLN
INTRAMUSCULAR | Status: AC
  Filled 2013-07-06: qty 1

## 2013-07-06 MED ORDER — ROCURONIUM BROMIDE 100 MG/10ML IV SOLN
INTRAVENOUS | Status: DC | PRN
  Administered 2013-07-06: 40 mg via INTRAVENOUS

## 2013-07-06 MED ORDER — HYDROMORPHONE HCL PF 1 MG/ML IJ SOLN: 0.2500 mg | INTRAMUSCULAR | Status: DC | PRN

## 2013-07-06 MED ORDER — OXYCODONE HCL 5 MG PO TABS: 5.0000 mg | ORAL_TABLET | Freq: Once | ORAL | Status: DC | PRN

## 2013-07-06 MED ORDER — ARTIFICIAL TEARS OP OINT
TOPICAL_OINTMENT | OPHTHALMIC | Status: DC | PRN
  Administered 2013-07-06: 1 via OPHTHALMIC

## 2013-07-06 MED ORDER — SUCCINYLCHOLINE CHLORIDE 20 MG/ML IJ SOLN
INTRAMUSCULAR | Status: DC | PRN
  Administered 2013-07-06: 100 mg via INTRAVENOUS

## 2013-07-06 MED ORDER — 0.9 % SODIUM CHLORIDE (POUR BTL) OPTIME
TOPICAL | Status: DC | PRN
  Administered 2013-07-06: 3000 mL

## 2013-07-06 MED ORDER — MIDAZOLAM HCL 10 MG/2ML IJ SOLN
INTRAMUSCULAR | Status: DC | PRN
  Administered 2013-07-06 (×3): 2 mg via INTRAVENOUS

## 2013-07-06 SURGICAL SUPPLY — 39 items
BLADE SURG ROTATE 9660 (MISCELLANEOUS) IMPLANT
CANISTER SUCTION 2500CC (MISCELLANEOUS) ×2 IMPLANT
CLOTH BEACON ORANGE TIMEOUT ST (SAFETY) ×2 IMPLANT
COVER SURGICAL LIGHT HANDLE (MISCELLANEOUS) ×2 IMPLANT
DRAPE LAPAROSCOPIC ABDOMINAL (DRAPES) ×2 IMPLANT
DRAPE UTILITY 15X26 W/TAPE STR (DRAPE) ×4 IMPLANT
DRAPE WARM FLUID 44X44 (DRAPE) ×2 IMPLANT
DRSG OPSITE POSTOP 4X8 (GAUZE/BANDAGES/DRESSINGS) ×2 IMPLANT
ELECT BLADE 6.5 EXT (BLADE) IMPLANT
ELECT CAUTERY BLADE 6.4 (BLADE) ×2 IMPLANT
ELECT REM PT RETURN 9FT ADLT (ELECTROSURGICAL) ×2
ELECTRODE REM PT RTRN 9FT ADLT (ELECTROSURGICAL) ×1 IMPLANT
GLOVE SURG SIGNA 7.5 PF LTX (GLOVE) ×2 IMPLANT
GOWN STRL NON-REIN LRG LVL3 (GOWN DISPOSABLE) ×4 IMPLANT
GOWN STRL REIN XL XLG (GOWN DISPOSABLE) ×2 IMPLANT
KIT BASIN OR (CUSTOM PROCEDURE TRAY) ×2 IMPLANT
KIT ROOM TURNOVER OR (KITS) ×2 IMPLANT
LIGASURE IMPACT 36 18CM CVD LR (INSTRUMENTS) IMPLANT
NS IRRIG 1000ML POUR BTL (IV SOLUTION) ×4 IMPLANT
PACK GENERAL/GYN (CUSTOM PROCEDURE TRAY) ×2 IMPLANT
PAD ARMBOARD 7.5X6 YLW CONV (MISCELLANEOUS) ×2 IMPLANT
RELOAD PROXIMATE 75MM BLUE (ENDOMECHANICALS) ×4 IMPLANT
SPECIMEN JAR LARGE (MISCELLANEOUS) IMPLANT
SPONGE LAP 18X18 X RAY DECT (DISPOSABLE) IMPLANT
STAPLER GUN LINEAR PROX 60 (STAPLE) ×2 IMPLANT
STAPLER PROXIMATE 75MM BLUE (STAPLE) ×2 IMPLANT
STAPLER VISISTAT 35W (STAPLE) ×2 IMPLANT
SUCTION POOLE TIP (SUCTIONS) ×2 IMPLANT
SUT PDS AB 1 TP1 96 (SUTURE) ×4 IMPLANT
SUT SILK 2 0 SH CR/8 (SUTURE) ×2 IMPLANT
SUT SILK 2 0 TIES 10X30 (SUTURE) ×2 IMPLANT
SUT SILK 3 0 SH CR/8 (SUTURE) ×2 IMPLANT
SUT SILK 3 0 TIES 10X30 (SUTURE) ×2 IMPLANT
SUT VIC AB 3-0 SH 18 (SUTURE) IMPLANT
TOWEL OR 17X24 6PK STRL BLUE (TOWEL DISPOSABLE) ×2 IMPLANT
TOWEL OR 17X26 10 PK STRL BLUE (TOWEL DISPOSABLE) ×2 IMPLANT
TRAY FOLEY CATH 14FRSI W/METER (CATHETERS) ×2 IMPLANT
WATER STERILE IRR 1000ML POUR (IV SOLUTION) IMPLANT
YANKAUER SUCT BULB TIP NO VENT (SUCTIONS) IMPLANT

## 2013-07-06 NOTE — Progress Notes (Signed)
eLink Nursing ICU Electrolyte Replacement Protocol  Patient Name: NORTH ESTERLINE DOB: 27-May-1941 MRN: [id number]  Date of Service  07/06/2013   HPI/Events of Note    Recent Labs Lab 07/01/13 2129 07/02/13 1230 07/04/13 0223 07/06/13 0415  NA 137 142 137 141  K 4.2 4.0 4.2 3.5  CL 105 110 108 115*  CO2 24 21 18* 21  GLUCOSE 254* 130* 213* 161*  BUN 21 21 18 21   CREATININE 1.32 1.05 1.03 1.00  CALCIUM 8.0* 7.6* 6.9* 6.7*    Estimated Creatinine Clearance: 85.1 ml/min (by C-G formula based on Cr of 1).  Intake/Output     07/30 0701 - 07/31 0700   P.O. 620   I.V. (mL/kg) 1300 (11.9)   Blood 2766   Total Intake(mL/kg) 4686 (43.1)   Urine (mL/kg/hr) 2300 (0.9)   Total Output 2300   Net +2386        - I/O DETAILED x24h    Total I/O In: 2895 [I.V.:1050; Blood:1845] Out: 1150 [Urine:1150] - I/O THIS SHIFT    ASSESSMENT   eICURN Interventions  K+3.5 Electrolyte protocol criteria met. Value replaced per protocol. MD notified.    ASSESSMENT: MAJOR ELECTROLYTE    Merita Norton 07/06/2013, 5:06 AM

## 2013-07-06 NOTE — Op Note (Signed)
[facility] [address] [place], [address]   COLONOSCOPY PROCEDURE REPORT  PATIENT: Timothy, [name]  MR#: [id number] BIRTHDATE: 22-Sep-1941 , 72  yrs. old GENDER: Male ENDOSCOPIST: Carman Ching, MD REFERRED BY:   CCM PROCEDURE DATE:  07/06/2013 PROCEDURE:    Colonoscopy ASA CLASS:    class 2 INDICATIONS:  acute G.I. bleeding. EGD done just before this was normal MEDICATIONS:  fentanyl 75 mcg, versed 6 mg IV  DESCRIPTION OF PROCEDURE: Following the EGD a digital rectal exam was performed revealing some bright red blood. The Pentax adult scope is inserted and there was a large amount of liquid that was bloody and clots in the rectum. The patient had been prepped with a gallon of NuLytley and did not receive any enemas purposefully. The feeling was that he had a left colon bleed and that if we were able to advance to the proximal colon without signs of active bleeding the area could be tattoo. The patient so far has refused subtotal collectively. It was a large amount of blood and clots throughout the colon and we vigorously irrigated in advance his scope. No clear active bleeding site was seen although there was a moderate amount of left sided diverticulosis. We were able to reach the hepatic flexure area. As we advance his scope proximally there became heavier and heavier bleeding with larger and larger clots. Ascending colon was essentially full of blood clots. The light transliterated in the right upper quadrant. Dr. Janee Morn was in attendance for the procedure. We then withdrew the scope in your data: and there was blood coming down from above but no clear active bleeding site in the transfers were left colon. Small lesions could have been messed. The patient tolerated procedure well.     COMPLICATIONS: None  ENDOSCOPIC IMPRESSION: 1. Acute lower G.I. bleeding. EGD just prior to this was negative. It appears from this procedure that the  Gen. bleeding site may be from the right colon. 2. Moderate left-sided diverticulosis without signs of active bleeding.  RECOMMENDATIONS: we will keep the patient NPO in anticipation of possible surgery tonight. We will try to obtain a nuclear medicine bleeding scan.    _______________________________ Worthy Rancher, MD 07/06/2013 4:28 PM  cc Dr Janee Morn

## 2013-07-06 NOTE — H&P (View-Only) (Signed)
[facility] PROGRESS NOTE Subjective no gross bleeding overnight. Hemoglobin to drop 9.2 to 7.6 the patient is not passed any stool. Denies abdominal pain. He remains NPO   Objective: Vital signs in last 24 hours: Temp:  [97.6 F (36.4 C)-98.9 F (37.2 C)] 98.2 F (36.8 C) (07/30 0720) Pulse Rate:  [79-126] 85 (07/30 0800) Resp:  [11-20] 18 (07/30 0800) BP: (88-121)/(49-66) 111/49 mmHg (07/30 0800) SpO2:  [96 %-100 %] 96 % (07/30 0800) Last BM Date: 07/04/13  Intake/Output from previous day: 07/29 0701 - 07/30 0700 In: 5052.5 [P.O.:480; I.V.:4100; Blood:472.5] Out: 1475 [Urine:1475] Intake/Output this shift:    PE: General-- patient alert in no distress  Abdomen-- soft and nontender  Lab Results:  Recent Labs  07/04/13 0223 07/04/13 0628 07/04/13 1026 07/04/13 2019 07/05/13 0507  WBC 9.7 11.3* 14.1* 12.3* 9.7  HGB 6.1* 7.4* 7.4* 9.2* 7.6*  HCT 17.8* 22.2* 21.8* 26.7* 21.9*  PLT 188 161 179 165 142*   BMET  Recent Labs  07/02/13 1230 07/04/13 0223  NA 142 137  K 4.0 4.2  CL 110 108  CO2 21 18*  CREATININE 1.05 1.03   LFT  Recent Labs  07/02/13 1230  PROT 4.9*  AST 11  ALT 8  ALKPHOS 45  BILITOT 0.9   PT/INR  Recent Labs  07/02/13 1230  LABPROT 14.7  INR 1.17   PANCREAS No results found for this basename: LIPASE,  in the last 72 hours       Studies/Results: No results found.  Medications: I have reviewed the patient's current medications.  Assessment/Plan: 1. Lower G.I. bleeding. Almost certainly due to diverticulosis. Patient has dropped his hemoglobin but there is no clinical bleeding. I will go ahead and resume clear liquids. He still refuses Miralax at this time. Continue to monitor hemoglobin and signs of clinical bleeding. If he does rebleed, would be bleeding scan or angiogram urgently.   Chrishonda Hesch JR,Jaryan Chicoine L 07/05/2013, 11:45 AM   

## 2013-07-06 NOTE — Progress Notes (Signed)
Patient ID: Timothy [name], male   DOB: [birth year], 72 y.o.   MRN: [id number] I observed a portion of Dr. Randa Evens colonoscopy.  There is blood throughout the colon with clots in R colon.  No actual source clearly identified.  Patient refuses subtotal colectomy so will proceed with NM bleeding scan tonight to try to localize.  Upper endo was negative. Dr. Randa Evens and I spoke to his daughter and wife to discuss the plan.  I will speak to my partner who is on call tonight as well so we can F/U on the scan. Violeta Gelinas, MD, MPH, FACS Pager: [phone]

## 2013-07-06 NOTE — Progress Notes (Signed)
UR Completed.  Timothy [name] [phone] 07/06/2013

## 2013-07-06 NOTE — Anesthesia Preprocedure Evaluation (Addendum)
Anesthesia Evaluation  Patient identified by MRN, date of birth, ID band Patient awake    Reviewed: Allergy & Precautions, H&P , NPO status , Patient's Chart, lab work & pertinent test results  Airway Mallampati: II TM Distance: >3 FB Neck ROM: Full    Dental  (+) Teeth Intact and Dental Advisory Given   Pulmonary  breath sounds clear to auscultation        Cardiovascular hypertension, Pt. on medications + CAD and + Peripheral Vascular Disease Rhythm:Regular Rate:Normal     Neuro/Psych    GI/Hepatic   Endo/Other  diabetes, Well Controlled, Type 2, Insulin Dependent  Renal/GU      Musculoskeletal   Abdominal   Peds  Hematology   Anesthesia Other Findings Bleeding from hepatic flexure.  Reproductive/Obstetrics                          Anesthesia Physical Anesthesia Plan  ASA: III  Anesthesia Plan: General   Post-op Pain Management:    Induction: Intravenous  Airway Management Planned: Oral ETT  Additional Equipment:   Intra-op Plan:   Post-operative Plan: Extubation in OR  Informed Consent: I have reviewed the patients History and Physical, chart, labs and discussed the procedure including the risks, benefits and alternatives for the proposed anesthesia with the patient or authorized representative who has indicated his/her understanding and acceptance.   Dental advisory given  Plan Discussed with: CRNA, Anesthesiologist and Surgeon  Anesthesia Plan Comments:         Anesthesia Quick Evaluation

## 2013-07-06 NOTE — Anesthesia Procedure Notes (Signed)
Procedure Name: Intubation Date/Time: 07/06/2013 9:11 PM Performed by: Orvilla Fus A Pre-anesthesia Checklist: Patient identified, Timeout performed, Emergency Drugs available, Suction available and Patient being monitored Patient Re-evaluated:Patient Re-evaluated prior to inductionOxygen Delivery Method: Circle system utilized Preoxygenation: Pre-oxygenation with 100% oxygen Intubation Type: IV induction Ventilation: Mask ventilation without difficulty and Oral airway inserted - appropriate to patient size Laryngoscope Size: Mac and 4 Grade View: Grade I Tube type: Oral Tube size: 7.5 mm Number of attempts: 1 Airway Equipment and Method: Stylet Placement Confirmation: ETT inserted through vocal cords under direct vision,  breath sounds checked- equal and bilateral and positive ETCO2 Secured at: 22 cm Tube secured with: Tape Dental Injury: Teeth and Oropharynx as per pre-operative assessment

## 2013-07-06 NOTE — Progress Notes (Signed)
Will go ahead with EGD and colon in hopes of isolating bleeding site. Discussed extensively with pt wife and daughter

## 2013-07-06 NOTE — Progress Notes (Signed)
eLink Physician-Brief Progress Note Patient Name: Timothy [name] DOB: [birth year] MRN: [id number]  Date of Service  07/06/2013   HPI/Events of Note     eICU Interventions  Vent/sedation orders   Intervention Category Major Interventions: Respiratory failure - evaluation and management  ALVA,RAKESH V. 07/06/2013, 11:01 PM

## 2013-07-06 NOTE — Op Note (Signed)
[facility] [address] [place], [address]   ENDOSCOPY PROCEDURE REPORT  PATIENT: Timothy, [name]  MR#: [id number] BIRTHDATE: [birth year] , 72  yrs. old GENDER: Male ENDOSCOPIST:Shivon Hackel Randa Evens, MD REFERRED BY: CCM PROCEDURE DATE:  07/06/2013 PROCEDURE:  EGD ASA CLASS:     class 2 INDICATIONS:   G.I. bleeding felt to be lower G.I. likely to require surgery. EGD performed to rule out calls for MEDICATION:    fentanyl 12.5 mcg, versed 4 milligram IV TOPICAL ANESTHETIC:    cetacaine spray  DESCRIPTION OF PROCEDURE:   The Pentax upper scope was inserted and advanced. The esophagus was completely normal with no bleeding. Moderate hiatal hernia was seen. The stomach was entered and advanced to the 2nd duodenum. This was completely clear and there was no sign of active or recent bleeding. Scope was drawn back into the stomach, pyloric channel, duodenal bulb, antrum and body of the stomach were free of active for recent bleeding. The scope was withdrawn patient tolerated procedure well     COMPLICATIONS: None  ENDOSCOPIC IMPRESSION: 1.Acute G.I. Bleeding. EGD normal other than hiatal hernia suspect lower G.I. source  RECOMMENDATIONS: will proceed with colonoscopy at this time    _______________________________ eSigned:  Carman Ching, MD 07/06/2013 4:20 PM  CC Dr. Violeta Gelinas

## 2013-07-06 NOTE — Op Note (Signed)
EXPLORATORY LAPAROTOMY  Procedure Note  Timothy [name] 07/01/2013 - 07/06/2013   Pre-op Diagnosis: Lower GI Hemorrhage     Post-op Diagnosis: Lower GI Hemorrhage  Procedure(s): EXPLORATORY LAPAROTOMY RIGHT PARTIAL COLECTOMY  Surgeon(s): [name], MD  Anesthesia: General  Staff:  Circulator: Larose Kells, RN Scrub Person: Lennie Hummer; Jani Files, CST  Estimated Blood Loss: Minimal               Specimens: sent to path          [facility]   Date: 07/06/2013  Time: 10:12 PM

## 2013-07-06 NOTE — Progress Notes (Signed)
PULMONARY  / CRITICAL CARE MEDICINE  Name: Timothy [name] MRN: [id number] DOB: 10-08-1941    ADMISSION DATE:  07/01/2013 CONSULTATION DATE:  07/06/2013  REFERRING MD :  Dr. Thedore Mins, [facility] SERVICE: [facility]  CHIEF COMPLAINT:  GI Bleeding  BRIEF PATIENT DESCRIPTION: 72 year old male with history of extensive diverticulosis, s/p recent right inguinal hernia repair (06/29/13), CAD s/p stenting (DES and BES) in 2003, DM-2, and HTN with persistent lower GI bleed and hypotension.  CCM consulted for evaluation and central line placement.  SIGNIFICANT EVENTS / STUDIES:  7/27 - Admission 7/27 - GI consulted: rec supportive care and transfusion (as needed). 7/29 - Surgery consulation: No intervention recommended as exact source of bleeding unclear. 7/30 - Additional lower GI bleed and hypotension. PCCM consultation. 8 units of PRBCs transfused to date 7/30 - Visceral angiography: Negative celiac, SMA and IMA angiograms for active bleeding or acute vascular abnormality 7/30 - 4 units PRBCs 7/31 - 2 units PRBCs 7/31 Endoscopy:   LINES / TUBES: Right IJ 7/30 >>   CULTURES: 7/27 Blood Cx >>> Negative  ANTIBIOTICS: None  SUBJECTIVE:  Transferred to ICU yesterday PM, had IR study for source of bleeding done which did not identify source  VITAL SIGNS: Temp:  [97.4 F (36.3 C)-98.9 F (37.2 C)] 98.1 F (36.7 C) (07/31 1113) Pulse Rate:  [70-112] 75 (07/31 1128) Resp:  [10-21] 14 (07/31 1128) BP: (71-134)/(34-65) 107/46 mmHg (07/31 1128) SpO2:  [96 %-100 %] 99 % (07/31 1100) Weight:  [108.8 kg (239 lb 13.8 oz)] 108.8 kg (239 lb 13.8 oz) (07/30 2100) HEMODYNAMICS:     INTAKE / OUTPUT: Intake/Output     07/30 0701 - 07/31 0700 07/31 0701 - 08/01 0700   P.O. 620    I.V. (mL/kg) 1750 (16.1) 150 (1.4)   Blood 2766 702.5   IV Piggyback 100    Total Intake(mL/kg) 5236 (48.1) 852.5 (7.8)   Urine (mL/kg/hr) 2300 (0.9)    Total Output 2300     Net +2936 +852.5           PHYSICAL EXAMINATION: General:  Pale, NAD Neuro:  Alert and oriented, nonfocal HEENT:  NCAT. Cardiovascular:  RRR Lungs: CTAB, NWOB, No rales, rhonchi, or wheezing. Abdomen: soft, nontender, nondistended. + BS Musculoskeletal:  2+ LE edema Skin:  Intact, dry; pale  LABS:  Recent Labs Lab 07/01/13 2214 07/02/13 0115 07/02/13 1230  07/04/13 0223  07/05/13 2100 07/06/13 0415 07/06/13 0500 07/06/13 0544  HGB  --   --  10.5*  < > 6.1*  < > 8.1*  --  6.5* 6.8*  WBC  --   --  7.2  < > 9.7  < > 11.4*  --  8.0 7.8  PLT  --   --  178  < > 188  < > 92*  --  145* 142*  NA  --   --  142  --  137  --   --  141  --   --   K  --   --  4.0  --  4.2  --   --  3.5  --   --   CL  --   --  110  --  108  --   --  115*  --   --   CO2  --   --  21  --  18*  --   --  21  --   --   GLUCOSE  --   --  130*  --  213*  --   --  161*  --   --   BUN  --   --  21  --  18  --   --  21  --   --   CREATININE  --   --  1.05  --  1.03  --   --  1.00  --   --   CALCIUM  --   --  7.6*  --  6.9*  --   --  6.7*  --   --   AST  --   --  11  --   --   --   --   --   --   --   ALT  --   --  8  --   --   --   --   --   --   --   ALKPHOS  --   --  45  --   --   --   --   --   --   --   BILITOT  --   --  0.9  --   --   --   --   --   --   --   PROT  --   --  4.9*  --   --   --   --   --   --   --   ALBUMIN  --   --  2.6*  --   --   --   --   --   --   --   APTT  --  26 27  --   --   --   --   --   --   --   INR  --  1.29 1.17  --   --   --   --   --   --   --   LATICACIDVEN 2.67*  --  1.0  --   --   --   --   --   --   --   < > = values in this interval not displayed.  Recent Labs Lab 07/05/13 1209 07/05/13 1545 07/05/13 2104 07/05/13 2344 07/06/13 0419  GLUCAP 145* 185* 265* 225* 155*   CXR: NNF   ASSESSMENT / PLAN:  PULMONARY A: No active issues P:   Supplemental [place] Oxygen as needed   CARDIOVASCULAR A: Hemorrhagic shock, resolved H/O CAD s/p stenting Hx of HTN P:  Holding home  antihypertensives Holding Plavix in the setting of active bleeding   RENAL A:  No active issues P:   IVFs adjusted Daily BMP   GASTROINTESTINAL A: GIB, presumed lower site P:   Colonoscopy planned. If unrevealing, tagged RBC study Surgery on stand by  HEMATOLOGIC A:  Acute blood loss anemia Coagulopathy - consumptive and dilutional P:  CBC's Q8 hour See GI section for further details Transfuse for active bleeding or Hgb < 9 Transfuse platelets for < 100 Goal INR < 1.5 Goal PTT < 35 Goal fibrinogen > 100   INFECTIOUS A:  No infectious source. P:   Micro and abx as above  ENDOCRINE A:  DM-2  P:   CBG Q4 Cont SSI, Sensitive  NEUROLOGIC A:  No active issues P: Monitor mental status  TODAY'S SUMMARY: scope today, monitor CBC, transfuse prn  Levert Feinstein, MD Family Medicine PGY-2   I have interviewed and examined the patient and reviewed the database. I have  formulated the assessment and plan as reflected in the note above with amendments made by me. Discussed in detail on work rounds and multidisc rounds. Discussed with Dr Randa Evens and Dr Janee Morn  Billy Fischer, MD;  PCCM service; Mobile [phone]

## 2013-07-06 NOTE — Transfer of Care (Signed)
Immediate Anesthesia Transfer of Care Note  Patient: Timothy [name]  Procedure(s) Performed: Procedure(s): EXPLORATORY LAPAROTOMY (Right)  Patient Location: PACU  Anesthesia Type:General  Level of Consciousness: Patient remains intubated per anesthesia plan  Airway & Oxygen Therapy: Patient remains intubated per anesthesia plan and Patient placed on Ventilator (see vital sign flow sheet for setting)  Post-op Assessment: Report given to PACU RN and Post -op Vital signs reviewed and stable  Post vital signs: Reviewed and stable  Complications: No apparent anesthesia complications

## 2013-07-06 NOTE — Progress Notes (Signed)
This RN assisted in transporting the patient to Pre-op in preparation for surgery. Family directed to Short stay waiting area. Report given to Orvilla Fus CRNA at bedside.Repeat CBC due at 9:15, CRNA is aware  Patient remained attached to monitor. Patient's blood pressure continuing to decline, all other vital signs stable. Patient's antibiotic which is due prior to surgery was sent and handed off to [employer].   Wyn Quaker RN

## 2013-07-06 NOTE — Interval H&P Note (Signed)
History and Physical Interval Note:  07/06/2013 2:56 PM  [name]  has presented today for surgery, with the diagnosis of gi bleeding  The various methods of treatment have been discussed with the patient and family. After consideration of risks, benefits and other options for treatment, the patient has consented to  Procedure(s) with comments: COLONOSCOPY (N/A) - Tattoo,clips,bicap available ESOPHAGOGASTRODUODENOSCOPY (EGD) (N/A) - at bedside as a surgical intervention .  The patient's history has been reviewed, patient examined, no change in status, stable for surgery.  I have reviewed the patient's chart and labs.  Questions were answered to the patient's satisfaction.     Kiernan Atkerson JR,Britlee Skolnik L

## 2013-07-06 NOTE — Progress Notes (Signed)
Subjective: No blood PR since transfer ti ICU  Objective: Vital signs in last 24 hours: Temp:  [97.6 F (36.4 C)-98.9 F (37.2 C)] 97.8 F (36.6 C) (07/31 0715) Pulse Rate:  [79-112] 79 (07/31 0715) Resp:  [10-21] 12 (07/31 0715) BP: (71-134)/(34-60) 101/43 mmHg (07/31 0715) SpO2:  [96 %-100 %] 100 % (07/31 0700) Weight:  [108.8 kg (239 lb 13.8 oz)] 108.8 kg (239 lb 13.8 oz) (07/30 2100) Last BM Date: 07/05/13  Intake/Output from previous day: 07/30 0701 - 07/31 0700 In: 5236 [P.O.:620; I.V.:1750; Blood:2766; IV Piggyback:100] Out: 2300 [Urine:2300] Intake/Output this shift: Total I/O In: 352.5 [Blood:352.5] Out: -   General appearance: alert Resp: clear to auscultation bilaterally Cardio: regular rate and rhythm GI: soft, NT, ND, hernia incision CDI Neurologic: Mental status: Alert, oriented, thought content appropriate  Lab Results:   Recent Labs  07/06/13 0500 07/06/13 0544  WBC 8.0 7.8  HGB 6.5* 6.8*  HCT 18.3* 18.8*  PLT 145* 142*   BMET  Recent Labs  07/04/13 0223 07/06/13 0415  NA 137 141  K 4.2 3.5  CL 108 115*  CO2 18* 21  GLUCOSE 213* 161*  BUN 18 21  CREATININE 1.03 1.00  CALCIUM 6.9* 6.7*   PT/INR No results found for this basename: LABPROT, INR,  in the last 72 hours ABG No results found for this basename: PHART, PCO2, PO2, HCO3,  in the last 72 hours  Studies/Results: Ir Angiogram Visceral Selective  07/05/2013   *RADIOLOGY REPORT*  Clinical Data: Acute GI bleeding, tachycardia, hypotension  ULTRASOUND GUIDANCE VASCULAR ACCESS CELIAC, SMA, AND IMA CATHETERIZATIONS AND ANGIOGRAMS  Date:  07/05/2013 18:00:00  Radiologist:  Judie Petit. Ruel Favors, M.D.  Medications:  1% lidocaine locally , 4 mg Versed 100 mcg Fentanyl for conscious sedation  Guidance:  Ultrasound fluoroscopic  Fluoroscopy time:  7.9 minutes  Sedation time:  40 minutes  Contrast volume:  180 ml Omnipaque-300  Complications:  No immediate  PROCEDURE/FINDINGS:  Informed consent was  obtained from the patient following explanation of the procedure, risks, benefits and alternatives. The patient understands, agrees and consents for the procedure. All questions were addressed.  A time out was performed.  Maximal barrier sterile technique utilized including caps, mask, sterile gowns, sterile gloves, large sterile drape, hand hygiene, and Chloroprep  Under sterile conditions and local anesthesia, ultrasound micropuncture access was performed the right common femoral artery. Images obtained for documentation.  5-French sheath inserted over a guide wire.  C2 catheter utilized to select the SMA origin over a Bentson guide wire.  Selective SMA angiograms performed.  SMA:  SMA is widely patent.  Main ileocolic trunk is patent and mesentery.  Jejunal and colic branches are visualized.  No evidence of active contrast extravasation, active bleeding, or vascular abnormality.  No early draining vein, AVM, or angiodysplasia demonstrated.  Catheter was exchanged for a Chung 2.5 catheter.  This catheter was formed over the bifurcation.  Selective IMA catheterization was performed.  IMA angiograms were performed next.  IMA:  IMA is widely patent.  The colic and superior hemorrhoidal branches are patent.  No evidence of active bleeding or abnormal vascularity demonstrated.  Catheter was advanced into the celiac origin.  Celiac origin was selected over a guide wire.  Selective celiac angiogram performed.  Celiac origin is widely patent. Common hepatic, right and left hepatic arteries, and gastroduodenal artery, splenic arteries are all patent.  No evidence of active bleeding.  Catheter was removed over a guidewire.  Sheath was removed. Hemostasis obtained  with an Exoseal device.  No immediate complication.  The patient tolerated the procedure well.  IMPRESSION: Negative celiac, SMA and IMA angiograms for active bleeding or acute vascular abnormality.   Original Report Authenticated By: Judie Petit. Miles Costain, M.D.   Ir  Angiogram Visceral Selective  07/05/2013   *RADIOLOGY REPORT*  Clinical Data: Acute GI bleeding, tachycardia, hypotension  ULTRASOUND GUIDANCE VASCULAR ACCESS CELIAC, SMA, AND IMA CATHETERIZATIONS AND ANGIOGRAMS  Date:  07/05/2013 18:00:00  Radiologist:  M. Ruel Favors, M.D.  Medications:  1% lidocaine locally , 4 mg Versed 100 mcg Fentanyl for conscious sedation  Guidance:  Ultrasound fluoroscopic  Fluoroscopy time:  7.9 minutes  Sedation time:  40 minutes  Contrast volume:  180 ml Omnipaque-300  Complications:  No immediate  PROCEDURE/FINDINGS:  Informed consent was obtained from the patient following explanation of the procedure, risks, benefits and alternatives. The patient understands, agrees and consents for the procedure. All questions were addressed.  A time out was performed.  Maximal barrier sterile technique utilized including caps, mask, sterile gowns, sterile gloves, large sterile drape, hand hygiene, and Chloroprep  Under sterile conditions and local anesthesia, ultrasound micropuncture access was performed the right common femoral artery. Images obtained for documentation.  5-French sheath inserted over a guide wire.  C2 catheter utilized to select the SMA origin over a Bentson guide wire.  Selective SMA angiograms performed.  SMA:  SMA is widely patent.  Main ileocolic trunk is patent and mesentery.  Jejunal and colic branches are visualized.  No evidence of active contrast extravasation, active bleeding, or vascular abnormality.  No early draining vein, AVM, or angiodysplasia demonstrated.  Catheter was exchanged for a Chung 2.5 catheter.  This catheter was formed over the bifurcation.  Selective IMA catheterization was performed.  IMA angiograms were performed next.  IMA:  IMA is widely patent.  The colic and superior hemorrhoidal branches are patent.  No evidence of active bleeding or abnormal vascularity demonstrated.  Catheter was advanced into the celiac origin.  Celiac origin was selected  over a guide wire.  Selective celiac angiogram performed.  Celiac origin is widely patent. Common hepatic, right and left hepatic arteries, and gastroduodenal artery, splenic arteries are all patent.  No evidence of active bleeding.  Catheter was removed over a guidewire.  Sheath was removed. Hemostasis obtained with an Exoseal device.  No immediate complication.  The patient tolerated the procedure well.  IMPRESSION: Negative celiac, SMA and IMA angiograms for active bleeding or acute vascular abnormality.   Original Report Authenticated By: Judie Petit. Miles Costain, M.D.   Ir Angiogram Visceral Selective  07/05/2013   *RADIOLOGY REPORT*  Clinical Data: Acute GI bleeding, tachycardia, hypotension  ULTRASOUND GUIDANCE VASCULAR ACCESS CELIAC, SMA, AND IMA CATHETERIZATIONS AND ANGIOGRAMS  Date:  07/05/2013 18:00:00  Radiologist:  M. Ruel Favors, M.D.  Medications:  1% lidocaine locally , 4 mg Versed 100 mcg Fentanyl for conscious sedation  Guidance:  Ultrasound fluoroscopic  Fluoroscopy time:  7.9 minutes  Sedation time:  40 minutes  Contrast volume:  180 ml Omnipaque-300  Complications:  No immediate  PROCEDURE/FINDINGS:  Informed consent was obtained from the patient following explanation of the procedure, risks, benefits and alternatives. The patient understands, agrees and consents for the procedure. All questions were addressed.  A time out was performed.  Maximal barrier sterile technique utilized including caps, mask, sterile gowns, sterile gloves, large sterile drape, hand hygiene, and Chloroprep  Under sterile conditions and local anesthesia, ultrasound micropuncture access was performed the right common femoral artery. Images  obtained for documentation.  5-French sheath inserted over a guide wire.  C2 catheter utilized to select the SMA origin over a Bentson guide wire.  Selective SMA angiograms performed.  SMA:  SMA is widely patent.  Main ileocolic trunk is patent and mesentery.  Jejunal and colic branches are  visualized.  No evidence of active contrast extravasation, active bleeding, or vascular abnormality.  No early draining vein, AVM, or angiodysplasia demonstrated.  Catheter was exchanged for a Chung 2.5 catheter.  This catheter was formed over the bifurcation.  Selective IMA catheterization was performed.  IMA angiograms were performed next.  IMA:  IMA is widely patent.  The colic and superior hemorrhoidal branches are patent.  No evidence of active bleeding or abnormal vascularity demonstrated.  Catheter was advanced into the celiac origin.  Celiac origin was selected over a guide wire.  Selective celiac angiogram performed.  Celiac origin is widely patent. Common hepatic, right and left hepatic arteries, and gastroduodenal artery, splenic arteries are all patent.  No evidence of active bleeding.  Catheter was removed over a guidewire.  Sheath was removed. Hemostasis obtained with an Exoseal device.  No immediate complication.  The patient tolerated the procedure well.  IMPRESSION: Negative celiac, SMA and IMA angiograms for active bleeding or acute vascular abnormality.   Original Report Authenticated By: Judie Petit. Miles Costain, M.D.   Ir US Guide Vasc Access Right  07/05/2013   *RADIOLOGY REPORT*  Clinical Data: Acute GI bleeding, tachycardia, hypotension  ULTRASOUND GUIDANCE VASCULAR ACCESS CELIAC, SMA, AND IMA CATHETERIZATIONS AND ANGIOGRAMS  Date:  07/05/2013 18:00:00  Radiologist:  M. Ruel Favors, M.D.  Medications:  1% lidocaine locally , 4 mg Versed 100 mcg Fentanyl for conscious sedation  Guidance:  Ultrasound fluoroscopic  Fluoroscopy time:  7.9 minutes  Sedation time:  40 minutes  Contrast volume:  180 ml Omnipaque-300  Complications:  No immediate  PROCEDURE/FINDINGS:  Informed consent was obtained from the patient following explanation of the procedure, risks, benefits and alternatives. The patient understands, agrees and consents for the procedure. All questions were addressed.  A time out was performed.   Maximal barrier sterile technique utilized including caps, mask, sterile gowns, sterile gloves, large sterile drape, hand hygiene, and Chloroprep  Under sterile conditions and local anesthesia, ultrasound micropuncture access was performed the right common femoral artery. Images obtained for documentation.  5-French sheath inserted over a guide wire.  C2 catheter utilized to select the SMA origin over a Bentson guide wire.  Selective SMA angiograms performed.  SMA:  SMA is widely patent.  Main ileocolic trunk is patent and mesentery.  Jejunal and colic branches are visualized.  No evidence of active contrast extravasation, active bleeding, or vascular abnormality.  No early draining vein, AVM, or angiodysplasia demonstrated.  Catheter was exchanged for a Chung 2.5 catheter.  This catheter was formed over the bifurcation.  Selective IMA catheterization was performed.  IMA angiograms were performed next.  IMA:  IMA is widely patent.  The colic and superior hemorrhoidal branches are patent.  No evidence of active bleeding or abnormal vascularity demonstrated.  Catheter was advanced into the celiac origin.  Celiac origin was selected over a guide wire.  Selective celiac angiogram performed.  Celiac origin is widely patent. Common hepatic, right and left hepatic arteries, and gastroduodenal artery, splenic arteries are all patent.  No evidence of active bleeding.  Catheter was removed over a guidewire.  Sheath was removed. Hemostasis obtained with an Exoseal device.  No immediate complication.  The patient tolerated  the procedure well.  IMPRESSION: Negative celiac, SMA and IMA angiograms for active bleeding or acute vascular abnormality.   Original Report Authenticated By: Judie Petit. Miles Costain, M.D.   Dg Chest Port 1 View  07/05/2013   *RADIOLOGY REPORT*  Clinical Data: Central line placement.  GI bleed.  PORTABLE CHEST - 1 VIEW  Comparison: 07/01/2013  Findings: Jugular vein catheter has been inserted and the tip is in the  superior vena cava in good position.  No pneumothorax.  Heart size and vascularity are normal and the lungs are clear.  IMPRESSION: Central line good position.  Clear lungs.   Original Report Authenticated By: Francene Boyers, M.D.   Ct Angio Abd/pel W/ And/or W/o  07/05/2013   *RADIOLOGY REPORT*  Clinical Data:  GI bleeding, back pain.  CT ANGIOGRAPHY ABDOMEN AND PELVIS  Technique:  Multidetector CT imaging of the abdomen and pelvis was performed using the standard protocol during bolus administration of intravenous contrast.  Multiplanar reconstructed images including MIPs were obtained and reviewed to evaluate the vascular anatomy.  Contrast: OMNIPAQUE IOHEXOL 350 MG/ML SOLN  Comparison:   None.  Arterial findings: Extensive coronary arterial calcifications. Aorta:                  Patchy atheromatous calcifications predominately in the infrarenal segment.  No dissection, aneurysm, or stenosis.  Celiac axis:            Patent, unremarkable  Superior mesenteric:Patent, classic distal branching.  Left renal:             Solitary, with partially calcified ostial plaque resulting in at least mild stenosis, patent distally.  Right renal:            Solitary, widely patent  Inferior mesenteric:Patent  Left iliac:             Scattered nonocclusive plaque, otherwise unremarkable.  Right iliac:            Mild scattered nonocclusive plaque, otherwise unremarkable.  Venous findings:  Patent hepatic veins, portal vein, superior mesenteric vein, splenic vein, bilateral renal veins, IVC, and iliac venous system.   Review of the MIP images confirms the above findings.  Nonvascular findings: Minimal dependent atelectasis in the visualized lung bases.  Unremarkable liver, gallbladder, spleen, adrenal glands, left kidney.  4.7 cm right parapelvic renal cyst and a smaller 2.4 cm exophytic cystic lesion from the right renal lower pole with some peripheral coarse calcifications.  No hydronephrosis.  Normal pancreas.   Stomach, small bowel, colon nondilated.  Normal appendix.  Scattered colonic diverticula most numerous in the sigmoid segment, without adjacent inflammatory/edematous change.  Scattered pelvic phleboliths. Urinary bladder is physiologically distended.  No ascites.  No free air.  Spondylitic changes in the lower lumbar spine.  No adenopathy.  IMPRESSION:  1.  Mild aortoiliac atheromatous plaque without significant visceral or renal artery stenosis or other significant vascular lesion. 2.  Scattered colonic diverticula.   Original Report Authenticated By: D. Andria Rhein, MD    Anti-infectives: Anti-infectives   Start     Dose/Rate Route Frequency Ordered Stop   07/02/13 0600  ciprofloxacin (CIPRO) IVPB 400 mg  Status:  Discontinued     400 mg 200 mL/hr over 60 Minutes Intravenous Every 12 hours 07/02/13 0137 07/03/13 1343   07/02/13 0145  metroNIDAZOLE (FLAGYL) IVPB 500 mg  Status:  Discontinued     500 mg 100 mL/hr over 60 Minutes Intravenous Every 8 hours 07/02/13 0137 07/03/13 1343  Assessment/Plan: LGIB - angio negative. Patient is unwilling to have subtotal colectomy. If we are able to localize the bleeding site he will consider surgery. For tagged RBC scan this AM.  LOS: 5 days    Timothy [name] 07/06/2013

## 2013-07-06 NOTE — Consult Note (Deleted)
PULMONARY  / CRITICAL CARE MEDICINE  Name: Timothy [name] MRN: [id number] DOB: 1941-05-27    ADMISSION DATE:  07/01/2013 CONSULTATION DATE:  07/06/2013  REFERRING MD :  Dr. Thedore Mins, [facility] PRIMARY SERVICE: [facility]  CHIEF COMPLAINT:  GI Bleeding  BRIEF PATIENT DESCRIPTION: 72 year old male with history of extensive diverticulosis, s/p recent right inguinal hernia repair (06/29/13), CAD s/p stenting (DES and BES) in 2003, DM-2, and HTN with persistent lower GI bleed and hypotension.  CCM consulted for evaluation and central line placement.  SIGNIFICANT EVENTS / STUDIES:  7/27 - Admission 7/27 - GI consulted; Supportive care and transfusion (as needed). 7/29 - Surgery consulted for evaluation. No intervention recommended as exact source of bleeding unclear. 7/30 - Additional lower GI bleed and hypotension.  LINES / TUBES: Right IJ 7/30 >>>  CULTURES: 7/27 Blood Cx >>> Negative  ANTIBIOTICS: None  SUBJECTIVE:  Transferred to ICU yesterday PM, had IR study for source of bleeding done which did not identify source  VITAL SIGNS: Temp:  [97.6 F (36.4 C)-98.9 F (37.2 C)] 97.8 F (36.6 C) (07/31 0830) Pulse Rate:  [77-112] 77 (07/31 0830) Resp:  [10-21] 14 (07/31 0830) BP: (71-134)/(34-60) 101/48 mmHg (07/31 0830) SpO2:  [96 %-100 %] 100 % (07/31 0800) Weight:  [239 lb 13.8 oz (108.8 kg)] 239 lb 13.8 oz (108.8 kg) (07/30 2100) HEMODYNAMICS:   2L [place]    INTAKE / OUTPUT: Intake/Output     07/30 0701 - 07/31 0700 07/31 0701 - 08/01 0700   P.O. 620    I.V. (mL/kg) 1750 (16.1)    Blood 2766 352.5   IV Piggyback 100    Total Intake(mL/kg) 5236 (48.1) 352.5 (3.2)   Urine (mL/kg/hr) 2300 (0.9)    Total Output 2300     Net +2936 +352.5          PHYSICAL EXAMINATION: General:  Pale, NAD Neuro:  Alert and oriented, nonfocal HEENT:  NCAT. Cardiovascular:  RRR Lungs: CTAB, NWOB, No rales, rhonchi, or wheezing. Abdomen: soft, nontender, nondistended. +  BS Musculoskeletal:  2+ LE edema Skin:  Intact, dry; pale  LABS:  Recent Labs Lab 07/01/13 2214 07/02/13 0115 07/02/13 1230  07/04/13 0223  07/05/13 2100 07/06/13 0415 07/06/13 0500 07/06/13 0544  HGB  --   --  10.5*  < > 6.1*  < > 8.1*  --  6.5* 6.8*  WBC  --   --  7.2  < > 9.7  < > 11.4*  --  8.0 7.8  PLT  --   --  178  < > 188  < > 92*  --  145* 142*  NA  --   --  142  --  137  --   --  141  --   --   K  --   --  4.0  --  4.2  --   --  3.5  --   --   CL  --   --  110  --  108  --   --  115*  --   --   CO2  --   --  21  --  18*  --   --  21  --   --   GLUCOSE  --   --  130*  --  213*  --   --  161*  --   --   BUN  --   --  21  --  18  --   --  21  --   --   CREATININE  --   --  1.05  --  1.03  --   --  1.00  --   --   CALCIUM  --   --  7.6*  --  6.9*  --   --  6.7*  --   --   AST  --   --  11  --   --   --   --   --   --   --   ALT  --   --  8  --   --   --   --   --   --   --   ALKPHOS  --   --  45  --   --   --   --   --   --   --   BILITOT  --   --  0.9  --   --   --   --   --   --   --   PROT  --   --  4.9*  --   --   --   --   --   --   --   ALBUMIN  --   --  2.6*  --   --   --   --   --   --   --   APTT  --  26 27  --   --   --   --   --   --   --   INR  --  1.29 1.17  --   --   --   --   --   --   --   LATICACIDVEN 2.67*  --  1.0  --   --   --   --   --   --   --   < > = values in this interval not displayed.  Recent Labs Lab 07/05/13 1209 07/05/13 1545 07/05/13 2104 07/05/13 2344 07/06/13 0419  GLUCAP 145* 185* 265* 225* 155*    ASSESSMENT / PLAN:  PULMONARY A: No active issues P:   Supplemental [place] Oxygen as needed May need intubation if CV worsens, stable currently  CARDIOVASCULAR A: CAD s/p stenting, Hx of HTN, Current Hypotension secondary to acute blood loss P:  Holding home antihypertensives in the setting of hypotension Holding Plavix in the setting of active bleeding Transfuse up to Hgb 9.0  RENAL A:  No active issues P:   Continuing  IV fluids - NS @ 150 mL/hr Daily BMP   GASTROINTESTINAL A:  Lower GI bleed, presumed Diverticular bleed given history, Acute blood loss anemia P:   IR studies showed no abnormalities in celiac, SMA, IMA, no significant aortic problems or renal artery stenosis S/p 15 Units PRBC total, also received FFP and platelets overnight Holding Plavix Q8H CBC GI and surgery following - GI planning to scope today, currently doing bowel prep  HEMATOLOGIC A:  Acute blood loss anemia secondary to acute lower GI bleed Coagulopathy - consumptive vs dilutional P:  CBC's Q8 hour See GI section for further details Transfuse for active bleeding or Hgb <9 Transfuse platelets for <100 Goal INR <1.5 Goal PTT <35 Goal fibrinogen <100   INFECTIOUS A:  No infectious source. P:   Cultures as above No need for Abx at this time  ENDOCRINE A:  DM-2  P:   CBG Q4 SSI, Sensitive  NEUROLOGIC A:  No active issues P:  Monitor mental status  TODAY'S SUMMARY: scope today, monitor CBC, transfuse prn  Levert Feinstein, MD Family Medicine PGY-2

## 2013-07-06 NOTE — Progress Notes (Signed)
Patient ID: Timothy [name], male   DOB: Aug 06, 1941, 72 y.o.   MRN: [id number]  Patient's bleeding scan was quickly positive indicating bleeding at the hepatic flexure of the colon.  This was reviewed with the radiologist.  Currently he remains slightly hypotensive with tachycardia.  I discussed this with the patient and his family.  I recommend emergent exploratory laparotomy with a partial colectomy.  I discussed the risks in detail which include but are not limited to bleeding, infection, anastomotic breakdown, need for further surgery, still missing the area of bleeding, cardiopulmonary issues, etc.  He agrees to proceed. Surgery is scheduled.

## 2013-07-07 ENCOUNTER — Encounter (HOSPITAL_COMMUNITY): Payer: Self-pay | Admitting: Gastroenterology

## 2013-07-07 DIAGNOSIS — J95821 Acute postprocedural respiratory failure: Secondary | ICD-10-CM

## 2013-07-07 HISTORY — DX: Acute postprocedural respiratory failure: J95.821

## 2013-07-07 LAB — BASIC METABOLIC PANEL
CO2: 15 mEq/L — ABNORMAL LOW (ref 19–32)
Calcium: 6.4 mg/dL — CL (ref 8.4–10.5)
Chloride: 115 mEq/L — ABNORMAL HIGH (ref 96–112)
Creatinine, Ser: 1.04 mg/dL (ref 0.50–1.35)
GFR calc Af Amer: 80 mL/min — ABNORMAL LOW (ref >90)
Sodium: 143 mEq/L (ref 135–145)

## 2013-07-07 LAB — CBC
HCT: 32.1 % — ABNORMAL LOW (ref 39.0–52.0)
MCH: 30.5 pg (ref 26.0–34.0)
MCV: 88.7 fL (ref 78.0–100.0)
MCV: 87 fL (ref 78.0–100.0)
Platelets: 104 10*3/uL — ABNORMAL LOW (ref 150–400)
Platelets: 96 10*3/uL — ABNORMAL LOW (ref 150–400)
RBC: 3.62 MIL/uL — ABNORMAL LOW (ref 4.22–5.81)
RBC: 3.38 MIL/uL — ABNORMAL LOW (ref 4.22–5.81)
RDW: 14.8 % (ref 11.5–15.5)
WBC: 14.8 10*3/uL — ABNORMAL HIGH (ref 4.0–10.5)
WBC: 12.3 10*3/uL — ABNORMAL HIGH (ref 4.0–10.5)

## 2013-07-07 LAB — POCT I-STAT 3, ART BLOOD GAS (G3+)
O2 Saturation: 100 %
Patient temperature: 97.9
TCO2: 13 mmol/L (ref 0–100)
pCO2 arterial: 27 mmHg — ABNORMAL LOW (ref 35.0–45.0)
pCO2 arterial: 29.9 mmHg — ABNORMAL LOW (ref 35.0–45.0)
pH, Arterial: 7.181 — CL (ref 7.350–7.450)
pH, Arterial: 7.273 — ABNORMAL LOW (ref 7.350–7.450)
pO2, Arterial: 294 mmHg — ABNORMAL HIGH (ref 80.0–100.0)

## 2013-07-07 LAB — APTT: aPTT: 27 seconds (ref 24–37)

## 2013-07-07 LAB — GLUCOSE, CAPILLARY
Glucose-Capillary: 147 mg/dL — ABNORMAL HIGH (ref 70–99)
Glucose-Capillary: 232 mg/dL — ABNORMAL HIGH (ref 70–99)
Glucose-Capillary: 259 mg/dL — ABNORMAL HIGH (ref 70–99)
Glucose-Capillary: 265 mg/dL — ABNORMAL HIGH (ref 70–99)
Glucose-Capillary: 283 mg/dL — ABNORMAL HIGH (ref 70–99)

## 2013-07-07 MED ORDER — DIPHENHYDRAMINE HCL 50 MG/ML IJ SOLN: 12.5000 mg | Freq: Four times a day (QID) | INTRAMUSCULAR | Status: DC | PRN

## 2013-07-07 MED ORDER — ONDANSETRON HCL 4 MG/2ML IJ SOLN: 4.0000 mg | Freq: Four times a day (QID) | INTRAMUSCULAR | Status: DC | PRN

## 2013-07-07 MED ORDER — PANTOPRAZOLE SODIUM 40 MG IV SOLR
40.0000 mg | INTRAVENOUS | Status: DC
  Administered 2013-07-07 – 2013-07-09 (×3): 40 mg via INTRAVENOUS
  Filled 2013-07-07 (×4): qty 40

## 2013-07-07 MED ORDER — SODIUM BICARBONATE 8.4 % IV SOLN
INTRAVENOUS | Status: AC
  Filled 2013-07-07: qty 50

## 2013-07-07 MED ORDER — CALCIUM GLUCONATE 10 % IV SOLN
1.0000 g | Freq: Once | INTRAVENOUS | Status: AC
  Administered 2013-07-07: 1 g via INTRAVENOUS
  Filled 2013-07-07: qty 10

## 2013-07-07 MED ORDER — DIPHENHYDRAMINE HCL 12.5 MG/5ML PO ELIX
12.5000 mg | ORAL_SOLUTION | Freq: Four times a day (QID) | ORAL | Status: DC | PRN
  Filled 2013-07-07: qty 5

## 2013-07-07 MED ORDER — SODIUM BICARBONATE 8.4 % IV SOLN
INTRAVENOUS | Status: AC
  Administered 2013-07-07: 100 meq via INTRAVENOUS
  Filled 2013-07-07: qty 50

## 2013-07-07 MED ORDER — SODIUM CHLORIDE 0.9 % IJ SOLN: 9.0000 mL | INTRAMUSCULAR | Status: DC | PRN

## 2013-07-07 MED ORDER — NALOXONE HCL 0.4 MG/ML IJ SOLN: 0.4000 mg | INTRAMUSCULAR | Status: DC | PRN

## 2013-07-07 MED ORDER — FENTANYL 10 MCG/ML IV SOLN
INTRAVENOUS | Status: DC
  Administered 2013-07-07: 50 mL via INTRAVENOUS
  Administered 2013-07-07: 165 ug via INTRAVENOUS
  Administered 2013-07-07: 270 ug via INTRAVENOUS
  Administered 2013-07-07: 50 mL via INTRAVENOUS
  Administered 2013-07-08: 477 ug via INTRAVENOUS
  Administered 2013-07-08: 02:00:00 via INTRAVENOUS
  Filled 2013-07-07 (×4): qty 50

## 2013-07-07 MED ORDER — SODIUM BICARBONATE 8.4 % IV SOLN
100.0000 meq | Freq: Once | INTRAVENOUS | Status: AC
  Filled 2013-07-07: qty 100

## 2013-07-07 NOTE — Progress Notes (Signed)
Patient received 2 units of blood (PRBC) earlier during my shift. 1st unit completed at 1025, with no adverse reaction noted or voiced. 2nd unit completed at 1303 with no adverse reaction noted. Patient started at 0905 bowel prep, which ended at 1200 noon. Patient had several dark burgundy  bowel movements, at first with old blood clots then no blood clots was noted in the stool with 2nd bowel movement. CBC drawn and sent to lab around 1420. EGD and Colonoscopy started at bedside around 1505 and was completed ~1610 . After procedure patient had several bright red bowel movements with red clots. Linens changed at least 5-6 times.[id number] Called Dr. Vassie Loll due to patient blood loss after the procedure to have another CBC order to assess patient's H&H. At 1750 patient transported to nuclear medicine for GI Blood Loss Scan. [place] tech called at 1830 and stated patient BP 85/47. Started 3rd unit of PRBC in NM at 1854.Called Dr. Vassie Loll and asked if he could contact the radiologist and have them read the study early because patient BP continue to be labile and the study takes 2 hours to complete. Report given to [facility] in [place] at 1910. D. Onuchukwu, RN

## 2013-07-07 NOTE — Progress Notes (Signed)
eLink Physician-Brief Progress Note Patient Name: Timothy [name] DOB: 13-Mar-1941 MRN: [id number]  Date of Service  07/07/2013   HPI/Events of Note  Calcium 6.4.  Corrected for albumin in 7.5   eICU Interventions  Plan: Calcium replaced   Intervention Category Intermediate Interventions: Electrolyte abnormality - evaluation and management  DETERDING,ELIZABETH 07/07/2013, 5:37 AM

## 2013-07-07 NOTE — Op Note (Signed)
NAMEEFE, FAZZINO NO.:  [id number]  MEDICAL RECORD NO.:  [id number]  LOCATION:  2101                         FACILITY:  [facility]  PHYSICIAN:  Abigail Miyamoto, M.D. DATE OF BIRTH:  [birth year]  DATE OF PROCEDURE:  07/06/2013 DATE OF DISCHARGE:                              OPERATIVE REPORT   PREOPERATIVE DIAGNOSIS:  Lower GI hemorrhage.  POSTOPERATIVE DIAGNOSIS:  Lower GI hemorrhage.  PROCEDURES: 1. Exploratory laparotomy 2. Right partial colectomy.  SURGEON:  Abigail Miyamoto, MD  ASSISTANT:  Wilmon Arms. Tsuei, MD  ANESTHESIA:  General endotracheal anesthesia.  ESTIMATED BLOOD LOSS:  Minimal.  INDICATIONS:  This is a 73 year old gentleman who presented with a lower gastrointestinal hemorrhage.  He had extensive workup including lower endoscopy, arteriogram, CT angiography, and a bleeding scan.  The bleeding scan finally demonstrated an area of activity in the hepatic flexure consistent with ongoing bleeding.  He had received multiple transfusions of packed red blood cells but was still having active bright red blood per rectum.  Therefore, decision was made to proceed to the operating room for a partial colectomy.  PROCEDURE IN DETAIL:  The patient was brought to the operating room, identified as Timothy [name].  He was placed supine on the operating table and general anesthesia was induced.  A time-out was then performed.  His abdomen was then prepped and draped in usual sterile fashion.  A midline incision was then created with a scalpel.  This was taken down to the fascia with electrocautery.  The peritoneum was then opened the entire length of the incision.  Upon entering the abdomen, the small bowel was eviscerated.  The colon was identified and found to be consistent with his intraluminal hemorrhage.  There were no other abnormalities identified.  At this point, an area of the mid transverse colon was identified and transected with the GIA 75  stapler.  The right colon was mobilized along the white line of [place].  The terminal ileum was identified and transected as well with the GIA 75 stapler.  The colon was mobilized further and then the mesentery was taken down with the ligature cautery device as well as silk tie.  The right colon specimenwas then completely removed and sent to pathology for evaluation.  At this point, the distal small bowel was aligned to the remaining transverse colon in side-to-side fashion with silk sutures.  A colotomy and enterotomy were created with cautery and a side-to-side anastomosis was created with a single firing of the GIA 75 stapler.  The open end was then closed with a TA 60 stapler.  The mesenteric defect was then closed with interrupted silk sutures.  The anastomosis was examined and found to be widely patent, as well as viable appearing well perfused.  At this point, the abdomen was irrigated with several liters of normal saline.  Hemostasis appeared to be achieved.  The patient's midline fascia was then closed with running #1 looped PDS suture.  The skin was then irrigated and closed with skin staples.  The patient tolerated the procedure well.  All sponge, needle, and instrument counts were correct at the end of the procedure.  The patient was then taken in  a guarded condition from the operating room back to the intensive care unit.     Abigail Miyamoto, M.D.     DB/MEDQ  D:  07/06/2013  T:  07/07/2013  Job:  [id number]

## 2013-07-07 NOTE — Progress Notes (Signed)
PULMONARY  / CRITICAL CARE MEDICINE  Name: Timothy [name] MRN: [id number] DOB: September 22, 1941    ADMISSION DATE:  07/01/2013 CONSULTATION DATE:  07/07/2013  REFERRING MD :  Dr. Thedore Mins, [facility] PRIMARY SERVICE: [facility]  CHIEF COMPLAINT:  GI Bleeding  BRIEF PATIENT DESCRIPTION: 72 year old male with history of extensive diverticulosis, s/p recent right inguinal hernia repair (06/29/13), CAD s/p stenting (DES and BES) in 2003, DM-2, and HTN with persistent lower GI bleed and hypotension.  CCM consulted for evaluation and central line placement.  SIGNIFICANT EVENTS / STUDIES:  7/27 - Admission 7/27 - GI consulted: rec supportive care and transfusion (as needed). 7/29 - Surgery consulation: No intervention recommended as exact source of bleeding unclear. 7/30 - Additional lower GI bleed and hypotension. PCCM consultation. 8 units of PRBCs transfused to date 7/30 - Visceral angiography: Negative celiac, SMA and IMA angiograms for active bleeding or acute vascular abnormality 7/30 - 4 units PRBCs 7/31 - 2 units PRBCs 7/31 Endoscopy: Bleeding appeared to be from R side of colon 7/31 Tagged RBC study: positive bleeding @ hepatic flexure 7/31 OR for R hemicolectomy 8/1 Self extubation - tolerating   LINES / TUBES: ETT 7/31 >> 8/1 Right IJ 7/30 >>   CULTURES: 7/27 Blood Cx >>> Negative  ANTIBIOTICS: None  SUBJECTIVE:  Self extubated. Tolerating well. Slightly confused but F/C.  VITAL SIGNS: Temp:  [97.4 F (36.3 C)-99 F (37.2 C)] 99 F (37.2 C) (08/01 0832) Pulse Rate:  [69-105] 98 (08/01 1000) Resp:  [0-22] 6 (08/01 1000) BP: (78-145)/(38-75) 140/61 mmHg (08/01 1000) SpO2:  [95 %-100 %] 100 % (08/01 1000) FiO2 (%):  [40 %-100 %] 40 % (08/01 0740) HEMODYNAMICS:   Vent Mode:  [-] CPAP;PSV FiO2 (%):  [40 %-100 %] 40 % Set Rate:  [14 bmp-18 bmp] 18 bmp Vt Set:  [620 mL] 620 mL PEEP:  [5 cmH20] 5 cmH20 Pressure Support:  [5 cmH20] 5 cmH20 Plateau Pressure:  [13 cmH20-17  cmH20] 17 cmH20 INTAKE / OUTPUT: Intake/Output     07/31 0701 - 08/01 0700 08/01 0701 - 08/02 0700   P.O. 2800    I.V. (mL/kg) 3352.6 (30.8) 109.1 (1)   Blood 2102.5    NG/GT 30    IV Piggyback 360    Total Intake(mL/kg) 8645.1 (79.5) 109.1 (1)   Urine (mL/kg/hr) 2325 (0.9)    Stool 2250 (0.9)    Blood 50 (0)    Total Output 4625     Net +4020.1 +109.1        Stool Occurrence 7 x      PHYSICAL EXAMINATION: General:  Pale, NAD Neuro:  No focal deficits HEENT: WNL Cardiovascular:  RRR s M Lungs: Clear anteriorly Abdomen: soft, diminished BS, mildly tender Musculoskeletal: No LE edema  LABS:  Recent Labs Lab 07/01/13 2214 07/02/13 0115 07/02/13 1230  07/04/13 0223  07/06/13 0415  07/06/13 1644 07/06/13 2222 07/06/13 2323 07/06/13 2357 07/07/13 0144 07/07/13 0434  HGB  --   --  10.5*  < > 6.1*  < >  --   < > 7.3* 10.5* 11.1*  --   --  10.3*  WBC  --   --  7.2  < > 9.7  < >  --   < > 7.6  --  14.8*  --   --  12.3*  PLT  --   --  178  < > 188  < >  --   < > 121*  --  104*  --   --  96*  NA  --   --  142  --  137  --  141  --   --  142  --   --   --  143  K  --   --  4.0  --  4.2  --  3.5  --   --  4.4  --   --   --  4.0  CL  --   --  110  --  108  --  115*  --   --   --   --   --   --  115*  CO2  --   --  21  --  18*  --  21  --   --   --   --   --   --  15*  GLUCOSE  --   --  130*  --  213*  --  161*  --   --  271*  --   --   --  271*  BUN  --   --  21  --  18  --  21  --   --   --   --   --   --  18  CREATININE  --   --  1.05  --  1.03  --  1.00  --   --   --   --   --   --  1.04  CALCIUM  --   --  7.6*  --  6.9*  --  6.7*  --   --   --   --   --   --  6.4*  AST  --   --  11  --   --   --   --   --   --   --   --   --   --   --   ALT  --   --  8  --   --   --   --   --   --   --   --   --   --   --   ALKPHOS  --   --  45  --   --   --   --   --   --   --   --   --   --   --   BILITOT  --   --  0.9  --   --   --   --   --   --   --   --   --   --   --   PROT  --    --  4.9*  --   --   --   --   --   --   --   --   --   --   --   ALBUMIN  --   --  2.6*  --   --   --   --   --   --   --   --   --   --   --   APTT  --  26 27  --   --   --   --   --   --   --   --   --   --  27  INR  --  1.29 1.17  --   --   --   --   --   --   --   --   --   --  1.42  LATICACIDVEN 2.67*  --  1.0  --   --   --   --   --   --   --   --   --   --   --   PHART  --   --   --   --   --   --   --   --   --   --   --  7.181* 7.273*  --   PCO2ART  --   --   --   --   --   --   --   --   --   --   --  29.9* 27.0*  --   PO2ART  --   --   --   --   --   --   --   --   --   --   --  294.0* 87.0  --   < > = values in this interval not displayed.  Recent Labs Lab 07/06/13 1719 07/06/13 2014 07/06/13 2336 07/07/13 0431 07/07/13 0743  GLUCAP 186* 261* 283* 259* 253*   CXR: NNF   ASSESSMENT / PLAN:  PULMONARY A: post op resp failure, appears resolved P:   Cont supplemental O2 as needed   CARDIOVASCULAR A: Hemorrhagic shock, resolved H/O CAD s/p stenting Hx of HTN P:  Holding home antihypertensives Holding Plavix in the setting of active bleeding   RENAL A:  No active issues P:   IVFs adjusted Daily BMP   GASTROINTESTINAL A: LGIB S/P R hemicolectomy 7/31 P:   Post op mgmt inc timing of diet per CCS  HEMATOLOGIC A:  Acute blood loss anemia, no further bleeding evident Thrombocytopenia, mild - no indication for plt tx Coagulopathy - consumptive and dilutional P:  Change CBCs to daily Correct coagulopathy as indicated  INFECTIOUS A:  No evidence of active infection P:   Micro and abx as above  ENDOCRINE A:  DM-2  P:   Cont q 4 SSI - will need to adjust scale and timing as nutrition started and advanced  NEUROLOGIC A:  Mild post op delirium Post op pain P: PCA fentanyl ordered  TODAY'S SUMMARY:   CCM X 35 mins  Billy Fischer, MD ; [facility] service Mobile [phone].  After 5:30 PM or weekends, call [phone]

## 2013-07-07 NOTE — Anesthesia Postprocedure Evaluation (Signed)
  Anesthesia Post-op Note  Patient: Timothy [name]  Procedure(s) Performed: Procedure(s): EXPLORATORY LAPAROTOMY (Right)  Patient Location: PACU  Anesthesia Type:General  Level of Consciousness: sedated, unresponsive and Patient remains intubated per anesthesia plan  Airway and Oxygen Therapy: Patient remains intubated per anesthesia plan and Patient placed on Ventilator (see vital sign flow sheet for setting)  Post-op Pain: none  Post-op Assessment: Post-op Vital signs reviewed  Post-op Vital Signs: Reviewed  Complications: No apparent anesthesia complications

## 2013-07-07 NOTE — Progress Notes (Signed)
CRITICAL VALUE ALERT  Critical value received:  Ca 6.4   Date of notification:  07/07/13  Time of notification:  0534  Critical value read back:yes  Nurse who received alert:  A.Canary Brim RN  MD notified (1st page):  Deterding MD  Time of first page:    MD notified (2nd page):  Time of second page:  Responding MD:  [id number]  Time MD responded:  Deterding MD

## 2013-07-07 NOTE — Progress Notes (Signed)
eLink Physician-Brief Progress Note Patient Name: Timothy [name] DOB: March 30, 1941 MRN: [id number]  Date of Service  07/07/2013   HPI/Events of Note  Patient s/p surgery for GIB now intubated.  ABG shows metabolic acidosis with attempts at resp compensation: 7.18/29.9/294/11.2   eICU Interventions  Plan: Increased RR to 18 - some breath stacking with increased RR to 25 with 2 amps of bicarb IVP - recheck ABG in 1 hour.   Intervention Category Major Interventions: Acid-Base disturbance - evaluation and management  Raynald Rouillard 07/07/2013, 12:04 AM

## 2013-07-07 NOTE — Progress Notes (Signed)
Patient ID: Timothy [name], male   DOB: 05-02-1941, 72 y.o.   MRN: [id number] Now extubated.  I spoke to him and his wife and answered their questions. Violeta Gelinas, MD, MPH, FACS Pager: [phone]

## 2013-07-07 NOTE — Progress Notes (Signed)
1 Day Post-Op  Subjective: On vent  Objective: Vital signs in last 24 hours: Temp:  [97.4 F (36.3 C)-98.4 F (36.9 C)] 98.4 F (36.9 C) (08/01 0436) Pulse Rate:  [69-105] 96 (08/01 0645) Resp:  [0-22] 18 (08/01 0645) BP: (78-128)/(38-67) 117/66 mmHg (08/01 0645) SpO2:  [95 %-100 %] 100 % (08/01 0645) FiO2 (%):  [40 %-100 %] 40 % (08/01 0418) Last BM Date: 07/06/13  Intake/Output from previous day: 07/31 0701 - 08/01 0700 In: 8420.5 [P.O.:2800; I.V.:3238; Blood:2102.5; NG/GT:30; IV Piggyback:250] Out: 3775 [Urine:1475; Stool:2250; Blood:50] Intake/Output this shift:    General appearance: no distress Resp: clear to auscultation bilaterally Cardio: regular rate and rhythm GI: soft, quiet, dressing intact Neuro: F/C  Lab Results:   Recent Labs  07/06/13 2323 07/07/13 0434  WBC 14.8* 12.3*  HGB 11.1* 10.3*  HCT 32.1* 29.4*  PLT 104* 96*   BMET  Recent Labs  07/06/13 0415 07/06/13 2222 07/07/13 0434  NA 141 142 143  K 3.5 4.4 4.0  CL 115*  --  115*  CO2 21  --  15*  GLUCOSE 161* 271* 271*  BUN 21  --  18  CREATININE 1.00  --  1.04  CALCIUM 6.7*  --  6.4*   PT/INR  Recent Labs  07/07/13 0434  LABPROT 17.0*  INR 1.42   ABG  Recent Labs  07/06/13 2357 07/07/13 0144  PHART 7.181* 7.273*  HCO3 11.2* 12.6*    Studies/Results: Nm Gi Blood Loss  07/06/2013   *RADIOLOGY REPORT*  Clinical Data: Acute lower GI bleed with colonoscopy today demonstrating blood and clots in the colon, more prominently at the level of the hepatic flexure and ascending colon.  NUCLEAR MEDICINE GASTROINTESTINAL BLEEDING STUDY  Technique:  Sequential abdominal images were obtained following intravenous administration of Tc-37m labeled red blood cells.  Radiopharmaceutical: CURIE ULTRATAG TECHNETIUM TC 64M- LABELED RED BLOOD CELLS IV KIT  Comparison: CT angiography of the abdomen pelvis and mesenteric arteriography on 07/05/2013.  Findings: After administration of tag red  blood cells, there is immediate positive bleeding identified at the level of the hepatic flexure of the colon.  Imaging was performed over 1 hour showing transit of positive colonic activity initially into the ascending colon and ultimately across the transverse colon and splenic flexure.  By the end of 1 hour of imaging, blood had reached the lower descending colon.  IMPRESSION: The bleeding scan is positive for acute bleeding at the level of the hepatic flexure of the colon.  Findings were communicated to Dr. Janee Morn during the examination.   Original Report Authenticated By: Irish Lack, M.D.   Ir Angiogram Visceral Selective  07/05/2013   *RADIOLOGY REPORT*  Clinical Data: Acute GI bleeding, tachycardia, hypotension  ULTRASOUND GUIDANCE VASCULAR ACCESS CELIAC, SMA, AND IMA CATHETERIZATIONS AND ANGIOGRAMS  Date:  07/05/2013 18:00:00  Radiologist:  M. Ruel Favors, M.D.  Medications:  1% lidocaine locally , 4 mg Versed 100 mcg Fentanyl for conscious sedation  Guidance:  Ultrasound fluoroscopic  Fluoroscopy time:  7.9 minutes  Sedation time:  40 minutes  Contrast volume:  180 ml Omnipaque-300  Complications:  No immediate  PROCEDURE/FINDINGS:  Informed consent was obtained from the patient following explanation of the procedure, risks, benefits and alternatives. The patient understands, agrees and consents for the procedure. All questions were addressed.  A time out was performed.  Maximal barrier sterile technique utilized including caps, mask, sterile gowns, sterile gloves, large sterile drape, hand hygiene, and Chloroprep  Under sterile conditions and  local anesthesia, ultrasound micropuncture access was performed the right common femoral artery. Images obtained for documentation.  5-French sheath inserted over a guide wire.  C2 catheter utilized to select the SMA origin over a Bentson guide wire.  Selective SMA angiograms performed.  SMA:  SMA is widely patent.  Main ileocolic trunk is patent and  mesentery.  Jejunal and colic branches are visualized.  No evidence of active contrast extravasation, active bleeding, or vascular abnormality.  No early draining vein, AVM, or angiodysplasia demonstrated.  Catheter was exchanged for a Chung 2.5 catheter.  This catheter was formed over the bifurcation.  Selective IMA catheterization was performed.  IMA angiograms were performed next.  IMA:  IMA is widely patent.  The colic and superior hemorrhoidal branches are patent.  No evidence of active bleeding or abnormal vascularity demonstrated.  Catheter was advanced into the celiac origin.  Celiac origin was selected over a guide wire.  Selective celiac angiogram performed.  Celiac origin is widely patent. Common hepatic, right and left hepatic arteries, and gastroduodenal artery, splenic arteries are all patent.  No evidence of active bleeding.  Catheter was removed over a guidewire.  Sheath was removed. Hemostasis obtained with an Exoseal device.  No immediate complication.  The patient tolerated the procedure well.  IMPRESSION: Negative celiac, SMA and IMA angiograms for active bleeding or acute vascular abnormality.   Original Report Authenticated By: Judie Petit. Miles Costain, M.D.   Ir Angiogram Visceral Selective  07/05/2013   *RADIOLOGY REPORT*  Clinical Data: Acute GI bleeding, tachycardia, hypotension  ULTRASOUND GUIDANCE VASCULAR ACCESS CELIAC, SMA, AND IMA CATHETERIZATIONS AND ANGIOGRAMS  Date:  07/05/2013 18:00:00  Radiologist:  M. Ruel Favors, M.D.  Medications:  1% lidocaine locally , 4 mg Versed 100 mcg Fentanyl for conscious sedation  Guidance:  Ultrasound fluoroscopic  Fluoroscopy time:  7.9 minutes  Sedation time:  40 minutes  Contrast volume:  180 ml Omnipaque-300  Complications:  No immediate  PROCEDURE/FINDINGS:  Informed consent was obtained from the patient following explanation of the procedure, risks, benefits and alternatives. The patient understands, agrees and consents for the procedure. All questions were  addressed.  A time out was performed.  Maximal barrier sterile technique utilized including caps, mask, sterile gowns, sterile gloves, large sterile drape, hand hygiene, and Chloroprep  Under sterile conditions and local anesthesia, ultrasound micropuncture access was performed the right common femoral artery. Images obtained for documentation.  5-French sheath inserted over a guide wire.  C2 catheter utilized to select the SMA origin over a Bentson guide wire.  Selective SMA angiograms performed.  SMA:  SMA is widely patent.  Main ileocolic trunk is patent and mesentery.  Jejunal and colic branches are visualized.  No evidence of active contrast extravasation, active bleeding, or vascular abnormality.  No early draining vein, AVM, or angiodysplasia demonstrated.  Catheter was exchanged for a Chung 2.5 catheter.  This catheter was formed over the bifurcation.  Selective IMA catheterization was performed.  IMA angiograms were performed next.  IMA:  IMA is widely patent.  The colic and superior hemorrhoidal branches are patent.  No evidence of active bleeding or abnormal vascularity demonstrated.  Catheter was advanced into the celiac origin.  Celiac origin was selected over a guide wire.  Selective celiac angiogram performed.  Celiac origin is widely patent. Common hepatic, right and left hepatic arteries, and gastroduodenal artery, splenic arteries are all patent.  No evidence of active bleeding.  Catheter was removed over a guidewire.  Sheath was removed. Hemostasis obtained with  an Exoseal device.  No immediate complication.  The patient tolerated the procedure well.  IMPRESSION: Negative celiac, SMA and IMA angiograms for active bleeding or acute vascular abnormality.   Original Report Authenticated By: Judie Petit. Miles Costain, M.D.   Ir Angiogram Visceral Selective  07/05/2013   *RADIOLOGY REPORT*  Clinical Data: Acute GI bleeding, tachycardia, hypotension  ULTRASOUND GUIDANCE VASCULAR ACCESS CELIAC, SMA, AND IMA  CATHETERIZATIONS AND ANGIOGRAMS  Date:  07/05/2013 18:00:00  Radiologist:  M. Ruel Favors, M.D.  Medications:  1% lidocaine locally , 4 mg Versed 100 mcg Fentanyl for conscious sedation  Guidance:  Ultrasound fluoroscopic  Fluoroscopy time:  7.9 minutes  Sedation time:  40 minutes  Contrast volume:  180 ml Omnipaque-300  Complications:  No immediate  PROCEDURE/FINDINGS:  Informed consent was obtained from the patient following explanation of the procedure, risks, benefits and alternatives. The patient understands, agrees and consents for the procedure. All questions were addressed.  A time out was performed.  Maximal barrier sterile technique utilized including caps, mask, sterile gowns, sterile gloves, large sterile drape, hand hygiene, and Chloroprep  Under sterile conditions and local anesthesia, ultrasound micropuncture access was performed the right common femoral artery. Images obtained for documentation.  5-French sheath inserted over a guide wire.  C2 catheter utilized to select the SMA origin over a Bentson guide wire.  Selective SMA angiograms performed.  SMA:  SMA is widely patent.  Main ileocolic trunk is patent and mesentery.  Jejunal and colic branches are visualized.  No evidence of active contrast extravasation, active bleeding, or vascular abnormality.  No early draining vein, AVM, or angiodysplasia demonstrated.  Catheter was exchanged for a Chung 2.5 catheter.  This catheter was formed over the bifurcation.  Selective IMA catheterization was performed.  IMA angiograms were performed next.  IMA:  IMA is widely patent.  The colic and superior hemorrhoidal branches are patent.  No evidence of active bleeding or abnormal vascularity demonstrated.  Catheter was advanced into the celiac origin.  Celiac origin was selected over a guide wire.  Selective celiac angiogram performed.  Celiac origin is widely patent. Common hepatic, right and left hepatic arteries, and gastroduodenal artery, splenic arteries  are all patent.  No evidence of active bleeding.  Catheter was removed over a guidewire.  Sheath was removed. Hemostasis obtained with an Exoseal device.  No immediate complication.  The patient tolerated the procedure well.  IMPRESSION: Negative celiac, SMA and IMA angiograms for active bleeding or acute vascular abnormality.   Original Report Authenticated By: Judie Petit. Miles Costain, M.D.   Ir US Guide Vasc Access Right  07/05/2013   *RADIOLOGY REPORT*  Clinical Data: Acute GI bleeding, tachycardia, hypotension  ULTRASOUND GUIDANCE VASCULAR ACCESS CELIAC, SMA, AND IMA CATHETERIZATIONS AND ANGIOGRAMS  Date:  07/05/2013 18:00:00  Radiologist:  M. Ruel Favors, M.D.  Medications:  1% lidocaine locally , 4 mg Versed 100 mcg Fentanyl for conscious sedation  Guidance:  Ultrasound fluoroscopic  Fluoroscopy time:  7.9 minutes  Sedation time:  40 minutes  Contrast volume:  180 ml Omnipaque-300  Complications:  No immediate  PROCEDURE/FINDINGS:  Informed consent was obtained from the patient following explanation of the procedure, risks, benefits and alternatives. The patient understands, agrees and consents for the procedure. All questions were addressed.  A time out was performed.  Maximal barrier sterile technique utilized including caps, mask, sterile gowns, sterile gloves, large sterile drape, hand hygiene, and Chloroprep  Under sterile conditions and local anesthesia, ultrasound micropuncture access was performed the right common femoral artery.  Images obtained for documentation.  5-French sheath inserted over a guide wire.  C2 catheter utilized to select the SMA origin over a Bentson guide wire.  Selective SMA angiograms performed.  SMA:  SMA is widely patent.  Main ileocolic trunk is patent and mesentery.  Jejunal and colic branches are visualized.  No evidence of active contrast extravasation, active bleeding, or vascular abnormality.  No early draining vein, AVM, or angiodysplasia demonstrated.  Catheter was exchanged for a  Chung 2.5 catheter.  This catheter was formed over the bifurcation.  Selective IMA catheterization was performed.  IMA angiograms were performed next.  IMA:  IMA is widely patent.  The colic and superior hemorrhoidal branches are patent.  No evidence of active bleeding or abnormal vascularity demonstrated.  Catheter was advanced into the celiac origin.  Celiac origin was selected over a guide wire.  Selective celiac angiogram performed.  Celiac origin is widely patent. Common hepatic, right and left hepatic arteries, and gastroduodenal artery, splenic arteries are all patent.  No evidence of active bleeding.  Catheter was removed over a guidewire.  Sheath was removed. Hemostasis obtained with an Exoseal device.  No immediate complication.  The patient tolerated the procedure well.  IMPRESSION: Negative celiac, SMA and IMA angiograms for active bleeding or acute vascular abnormality.   Original Report Authenticated By: Judie Petit. Miles Costain, M.D.   Portable Chest Xray  07/06/2013   *RADIOLOGY REPORT*  Clinical Data: Status post right hemicolectomy for lower GI bleed.  PORTABLE CHEST - 1 VIEW  Comparison: 07/05/2013  Findings: Central line positioning stable in the lower SVC. Endotracheal tube is present with the tip approximately 2 cm above the carina.  A nasogastric tube extends into the stomach.  Lungs show bibasilar atelectasis.  No pulmonary edema or focal airspace consolidation is seen.  The heart size and mediastinal contours are within normal limits.  No significant pleural effusions are identified.  IMPRESSION: Endotracheal tube tip is approximately 2 cm above the carina. Lungs show bibasilar atelectasis.   Original Report Authenticated By: Irish Lack, M.D.   Dg Chest Port 1 View  07/05/2013   *RADIOLOGY REPORT*  Clinical Data: Central line placement.  GI bleed.  PORTABLE CHEST - 1 VIEW  Comparison: 07/01/2013  Findings: Jugular vein catheter has been inserted and the tip is in the superior vena cava in good  position.  No pneumothorax.  Heart size and vascularity are normal and the lungs are clear.  IMPRESSION: Central line good position.  Clear lungs.   Original Report Authenticated By: Francene Boyers, M.D.   Ct Angio Abd/pel W/ And/or W/o  07/05/2013   *RADIOLOGY REPORT*  Clinical Data:  GI bleeding, back pain.  CT ANGIOGRAPHY ABDOMEN AND PELVIS  Technique:  Multidetector CT imaging of the abdomen and pelvis was performed using the standard protocol during bolus administration of intravenous contrast.  Multiplanar reconstructed images including MIPs were obtained and reviewed to evaluate the vascular anatomy.  Contrast: OMNIPAQUE IOHEXOL 350 MG/ML SOLN  Comparison:   None.  Arterial findings: Extensive coronary arterial calcifications. Aorta:                  Patchy atheromatous calcifications predominately in the infrarenal segment.  No dissection, aneurysm, or stenosis.  Celiac axis:            Patent, unremarkable  Superior mesenteric:Patent, classic distal branching.  Left renal:             Solitary, with partially calcified ostial plaque resulting in at  least mild stenosis, patent distally.  Right renal:            Solitary, widely patent  Inferior mesenteric:Patent  Left iliac:             Scattered nonocclusive plaque, otherwise unremarkable.  Right iliac:            Mild scattered nonocclusive plaque, otherwise unremarkable.  Venous findings:  Patent hepatic veins, portal vein, superior mesenteric vein, splenic vein, bilateral renal veins, IVC, and iliac venous system.   Review of the MIP images confirms the above findings.  Nonvascular findings: Minimal dependent atelectasis in the visualized lung bases.  Unremarkable liver, gallbladder, spleen, adrenal glands, left kidney.  4.7 cm right parapelvic renal cyst and a smaller 2.4 cm exophytic cystic lesion from the right renal lower pole with some peripheral coarse calcifications.  No hydronephrosis.  Normal pancreas.  Stomach, small bowel, colon  nondilated.  Normal appendix.  Scattered colonic diverticula most numerous in the sigmoid segment, without adjacent inflammatory/edematous change.  Scattered pelvic phleboliths. Urinary bladder is physiologically distended.  No ascites.  No free air.  Spondylitic changes in the lower lumbar spine.  No adenopathy.  IMPRESSION:  1.  Mild aortoiliac atheromatous plaque without significant visceral or renal artery stenosis or other significant vascular lesion. 2.  Scattered colonic diverticula.   Original Report Authenticated By: D. Andria Rhein, MD    Anti-infectives: Anti-infectives   Start     Dose/Rate Route Frequency Ordered Stop   07/06/13 2015  cefOXitin (MEFOXIN) 2 g in dextrose 5 % 50 mL IVPB     2 g 100 mL/hr over 30 Minutes Intravenous  Once 07/06/13 2006 07/06/13 2113   07/02/13 0600  ciprofloxacin (CIPRO) IVPB 400 mg  Status:  Discontinued     400 mg 200 mL/hr over 60 Minutes Intravenous Every 12 hours 07/02/13 0137 07/03/13 1343   07/02/13 0145  metroNIDAZOLE (FLAGYL) IVPB 500 mg  Status:  Discontinued     500 mg 100 mL/hr over 60 Minutes Intravenous Every 8 hours 07/02/13 0137 07/03/13 1343      Assessment/Plan: s/p Procedure(s): EXPLORATORY LAPAROTOMY (Right) LGIB localized to hepatic flexure S/P R colectomy POD#1 - await return of bowel function VDRF - weaning per CCM ABL anemia - improved  LOS: 6 days    Samael Blades E 07/07/2013

## 2013-07-07 NOTE — Progress Notes (Signed)
[facility] PROGRESS NOTE Subjective OP findings noted.  Pt extubated has expected post op pain  Objective: Vital signs in last 24 hours: Temp:  [97.4 F (36.3 C)-99 F (37.2 C)] 99 F (37.2 C) (08/01 0832) Pulse Rate:  [69-105] 98 (08/01 1000) Resp:  [0-22] 6 (08/01 1000) BP: (78-145)/(38-75) 140/61 mmHg (08/01 1000) SpO2:  [95 %-100 %] 100 % (08/01 1000) FiO2 (%):  [40 %-100 %] 40 % (08/01 0740) Last BM Date: 07/06/13  Intake/Output from previous day: 07/31 0701 - 08/01 0700 In: 8645.1 [P.O.:2800; I.V.:3352.6; Blood:2102.5; NG/GT:30; IV Piggyback:360] Out: 4625 [Urine:2325; Stool:2250; Blood:50] Intake/Output this shift: Total I/O In: 109.1 [I.V.:109.1] Out: -     Lab Results:  Recent Labs  07/06/13 0544 07/06/13 1450 07/06/13 1644 07/06/13 2222 07/06/13 2323 07/07/13 0434  WBC 7.8 7.2 7.6  --  14.8* 12.3*  HGB 6.8* 8.5* 7.3* 10.5* 11.1* 10.3*  HCT 18.8* 24.5* 21.1* 31.0* 32.1* 29.4*  PLT 142* 124* 121*  --  104* 96*   BMET  Recent Labs  07/06/13 0415 07/06/13 2222 07/07/13 0434  NA 141 142 143  K 3.5 4.4 4.0  CL 115*  --  115*  CO2 21  --  15*  CREATININE 1.00  --  1.04   LFT No results found for this basename: PROT, AST, ALT, ALKPHOS, BILITOT, BILIDIR, IBILI,  in the last 72 hours PT/INR  Recent Labs  07/07/13 0434  LABPROT 17.0*  INR 1.42   PANCREAS No results found for this basename: LIPASE,  in the last 72 hours       Studies/Results: Nm Gi Blood Loss  07/06/2013   *RADIOLOGY REPORT*  Clinical Data: Acute lower GI bleed with colonoscopy today demonstrating blood and clots in the colon, more prominently at the level of the hepatic flexure and ascending colon.  NUCLEAR MEDICINE GASTROINTESTINAL BLEEDING STUDY  Technique:  Sequential abdominal images were obtained following intravenous administration of Tc-34m labeled red blood cells.  Radiopharmaceutical: CURIE ULTRATAG TECHNETIUM TC 49M- LABELED RED BLOOD CELLS IV  KIT  Comparison: CT angiography of the abdomen pelvis and mesenteric arteriography on 07/05/2013.  Findings: After administration of tag red blood cells, there is immediate positive bleeding identified at the level of the hepatic flexure of the colon.  Imaging was performed over 1 hour showing transit of positive colonic activity initially into the ascending colon and ultimately across the transverse colon and splenic flexure.  By the end of 1 hour of imaging, blood had reached the lower descending colon.  IMPRESSION: The bleeding scan is positive for acute bleeding at the level of the hepatic flexure of the colon.  Findings were communicated to Dr. Janee Morn during the examination.   Original Report Authenticated By: Irish Lack, M.D.   Ir Angiogram Visceral Selective  07/05/2013   *RADIOLOGY REPORT*  Clinical Data: Acute GI bleeding, tachycardia, hypotension  ULTRASOUND GUIDANCE VASCULAR ACCESS CELIAC, SMA, AND IMA CATHETERIZATIONS AND ANGIOGRAMS  Date:  07/05/2013 18:00:00  Radiologist:  M. Ruel Favors, M.D.  Medications:  1% lidocaine locally , 4 mg Versed 100 mcg Fentanyl for conscious sedation  Guidance:  Ultrasound fluoroscopic  Fluoroscopy time:  7.9 minutes  Sedation time:  40 minutes  Contrast volume:  180 ml Omnipaque-300  Complications:  No immediate  PROCEDURE/FINDINGS:  Informed consent was obtained from the patient following explanation of the procedure, risks, benefits and alternatives. The patient understands, agrees and consents for the procedure. All questions were addressed.  A time out was performed.  Maximal  barrier sterile technique utilized including caps, mask, sterile gowns, sterile gloves, large sterile drape, hand hygiene, and Chloroprep  Under sterile conditions and local anesthesia, ultrasound micropuncture access was performed the right common femoral artery. Images obtained for documentation.  5-French sheath inserted over a guide wire.  C2 catheter utilized to select the SMA  origin over a Bentson guide wire.  Selective SMA angiograms performed.  SMA:  SMA is widely patent.  Main ileocolic trunk is patent and mesentery.  Jejunal and colic branches are visualized.  No evidence of active contrast extravasation, active bleeding, or vascular abnormality.  No early draining vein, AVM, or angiodysplasia demonstrated.  Catheter was exchanged for a Chung 2.5 catheter.  This catheter was formed over the bifurcation.  Selective IMA catheterization was performed.  IMA angiograms were performed next.  IMA:  IMA is widely patent.  The colic and superior hemorrhoidal branches are patent.  No evidence of active bleeding or abnormal vascularity demonstrated.  Catheter was advanced into the celiac origin.  Celiac origin was selected over a guide wire.  Selective celiac angiogram performed.  Celiac origin is widely patent. Common hepatic, right and left hepatic arteries, and gastroduodenal artery, splenic arteries are all patent.  No evidence of active bleeding.  Catheter was removed over a guidewire.  Sheath was removed. Hemostasis obtained with an Exoseal device.  No immediate complication.  The patient tolerated the procedure well.  IMPRESSION: Negative celiac, SMA and IMA angiograms for active bleeding or acute vascular abnormality.   Original Report Authenticated By: Judie Petit. Miles Costain, M.D.   Ir Angiogram Visceral Selective  07/05/2013   *RADIOLOGY REPORT*  Clinical Data: Acute GI bleeding, tachycardia, hypotension  ULTRASOUND GUIDANCE VASCULAR ACCESS CELIAC, SMA, AND IMA CATHETERIZATIONS AND ANGIOGRAMS  Date:  07/05/2013 18:00:00  Radiologist:  M. Ruel Favors, M.D.  Medications:  1% lidocaine locally , 4 mg Versed 100 mcg Fentanyl for conscious sedation  Guidance:  Ultrasound fluoroscopic  Fluoroscopy time:  7.9 minutes  Sedation time:  40 minutes  Contrast volume:  180 ml Omnipaque-300  Complications:  No immediate  PROCEDURE/FINDINGS:  Informed consent was obtained from the patient following explanation  of the procedure, risks, benefits and alternatives. The patient understands, agrees and consents for the procedure. All questions were addressed.  A time out was performed.  Maximal barrier sterile technique utilized including caps, mask, sterile gowns, sterile gloves, large sterile drape, hand hygiene, and Chloroprep  Under sterile conditions and local anesthesia, ultrasound micropuncture access was performed the right common femoral artery. Images obtained for documentation.  5-French sheath inserted over a guide wire.  C2 catheter utilized to select the SMA origin over a Bentson guide wire.  Selective SMA angiograms performed.  SMA:  SMA is widely patent.  Main ileocolic trunk is patent and mesentery.  Jejunal and colic branches are visualized.  No evidence of active contrast extravasation, active bleeding, or vascular abnormality.  No early draining vein, AVM, or angiodysplasia demonstrated.  Catheter was exchanged for a Chung 2.5 catheter.  This catheter was formed over the bifurcation.  Selective IMA catheterization was performed.  IMA angiograms were performed next.  IMA:  IMA is widely patent.  The colic and superior hemorrhoidal branches are patent.  No evidence of active bleeding or abnormal vascularity demonstrated.  Catheter was advanced into the celiac origin.  Celiac origin was selected over a guide wire.  Selective celiac angiogram performed.  Celiac origin is widely patent. Common hepatic, right and left hepatic arteries, and gastroduodenal artery, splenic arteries  are all patent.  No evidence of active bleeding.  Catheter was removed over a guidewire.  Sheath was removed. Hemostasis obtained with an Exoseal device.  No immediate complication.  The patient tolerated the procedure well.  IMPRESSION: Negative celiac, SMA and IMA angiograms for active bleeding or acute vascular abnormality.   Original Report Authenticated By: Judie Petit. Miles Costain, M.D.   Ir Angiogram Visceral Selective  07/05/2013   *RADIOLOGY  REPORT*  Clinical Data: Acute GI bleeding, tachycardia, hypotension  ULTRASOUND GUIDANCE VASCULAR ACCESS CELIAC, SMA, AND IMA CATHETERIZATIONS AND ANGIOGRAMS  Date:  07/05/2013 18:00:00  Radiologist:  M. Ruel Favors, M.D.  Medications:  1% lidocaine locally , 4 mg Versed 100 mcg Fentanyl for conscious sedation  Guidance:  Ultrasound fluoroscopic  Fluoroscopy time:  7.9 minutes  Sedation time:  40 minutes  Contrast volume:  180 ml Omnipaque-300  Complications:  No immediate  PROCEDURE/FINDINGS:  Informed consent was obtained from the patient following explanation of the procedure, risks, benefits and alternatives. The patient understands, agrees and consents for the procedure. All questions were addressed.  A time out was performed.  Maximal barrier sterile technique utilized including caps, mask, sterile gowns, sterile gloves, large sterile drape, hand hygiene, and Chloroprep  Under sterile conditions and local anesthesia, ultrasound micropuncture access was performed the right common femoral artery. Images obtained for documentation.  5-French sheath inserted over a guide wire.  C2 catheter utilized to select the SMA origin over a Bentson guide wire.  Selective SMA angiograms performed.  SMA:  SMA is widely patent.  Main ileocolic trunk is patent and mesentery.  Jejunal and colic branches are visualized.  No evidence of active contrast extravasation, active bleeding, or vascular abnormality.  No early draining vein, AVM, or angiodysplasia demonstrated.  Catheter was exchanged for a Chung 2.5 catheter.  This catheter was formed over the bifurcation.  Selective IMA catheterization was performed.  IMA angiograms were performed next.  IMA:  IMA is widely patent.  The colic and superior hemorrhoidal branches are patent.  No evidence of active bleeding or abnormal vascularity demonstrated.  Catheter was advanced into the celiac origin.  Celiac origin was selected over a guide wire.  Selective celiac angiogram performed.   Celiac origin is widely patent. Common hepatic, right and left hepatic arteries, and gastroduodenal artery, splenic arteries are all patent.  No evidence of active bleeding.  Catheter was removed over a guidewire.  Sheath was removed. Hemostasis obtained with an Exoseal device.  No immediate complication.  The patient tolerated the procedure well.  IMPRESSION: Negative celiac, SMA and IMA angiograms for active bleeding or acute vascular abnormality.   Original Report Authenticated By: Judie Petit. Miles Costain, M.D.   Ir US Guide Vasc Access Right  07/05/2013   *RADIOLOGY REPORT*  Clinical Data: Acute GI bleeding, tachycardia, hypotension  ULTRASOUND GUIDANCE VASCULAR ACCESS CELIAC, SMA, AND IMA CATHETERIZATIONS AND ANGIOGRAMS  Date:  07/05/2013 18:00:00  Radiologist:  M. Ruel Favors, M.D.  Medications:  1% lidocaine locally , 4 mg Versed 100 mcg Fentanyl for conscious sedation  Guidance:  Ultrasound fluoroscopic  Fluoroscopy time:  7.9 minutes  Sedation time:  40 minutes  Contrast volume:  180 ml Omnipaque-300  Complications:  No immediate  PROCEDURE/FINDINGS:  Informed consent was obtained from the patient following explanation of the procedure, risks, benefits and alternatives. The patient understands, agrees and consents for the procedure. All questions were addressed.  A time out was performed.  Maximal barrier sterile technique utilized including caps, mask, sterile gowns, sterile gloves, large  sterile drape, hand hygiene, and Chloroprep  Under sterile conditions and local anesthesia, ultrasound micropuncture access was performed the right common femoral artery. Images obtained for documentation.  5-French sheath inserted over a guide wire.  C2 catheter utilized to select the SMA origin over a Bentson guide wire.  Selective SMA angiograms performed.  SMA:  SMA is widely patent.  Main ileocolic trunk is patent and mesentery.  Jejunal and colic branches are visualized.  No evidence of active contrast extravasation, active  bleeding, or vascular abnormality.  No early draining vein, AVM, or angiodysplasia demonstrated.  Catheter was exchanged for a Chung 2.5 catheter.  This catheter was formed over the bifurcation.  Selective IMA catheterization was performed.  IMA angiograms were performed next.  IMA:  IMA is widely patent.  The colic and superior hemorrhoidal branches are patent.  No evidence of active bleeding or abnormal vascularity demonstrated.  Catheter was advanced into the celiac origin.  Celiac origin was selected over a guide wire.  Selective celiac angiogram performed.  Celiac origin is widely patent. Common hepatic, right and left hepatic arteries, and gastroduodenal artery, splenic arteries are all patent.  No evidence of active bleeding.  Catheter was removed over a guidewire.  Sheath was removed. Hemostasis obtained with an Exoseal device.  No immediate complication.  The patient tolerated the procedure well.  IMPRESSION: Negative celiac, SMA and IMA angiograms for active bleeding or acute vascular abnormality.   Original Report Authenticated By: Judie Petit. Miles Costain, M.D.   Portable Chest Xray  07/06/2013   *RADIOLOGY REPORT*  Clinical Data: Status post right hemicolectomy for lower GI bleed.  PORTABLE CHEST - 1 VIEW  Comparison: 07/05/2013  Findings: Central line positioning stable in the lower SVC. Endotracheal tube is present with the tip approximately 2 cm above the carina.  A nasogastric tube extends into the stomach.  Lungs show bibasilar atelectasis.  No pulmonary edema or focal airspace consolidation is seen.  The heart size and mediastinal contours are within normal limits.  No significant pleural effusions are identified.  IMPRESSION: Endotracheal tube tip is approximately 2 cm above the carina. Lungs show bibasilar atelectasis.   Original Report Authenticated By: Irish Lack, M.D.   Dg Chest Port 1 View  07/05/2013   *RADIOLOGY REPORT*  Clinical Data: Central line placement.  GI bleed.  PORTABLE CHEST - 1 VIEW   Comparison: 07/01/2013  Findings: Jugular vein catheter has been inserted and the tip is in the superior vena cava in good position.  No pneumothorax.  Heart size and vascularity are normal and the lungs are clear.  IMPRESSION: Central line good position.  Clear lungs.   Original Report Authenticated By: Francene Boyers, M.D.   Ct Angio Abd/pel W/ And/or W/o  07/05/2013   *RADIOLOGY REPORT*  Clinical Data:  GI bleeding, back pain.  CT ANGIOGRAPHY ABDOMEN AND PELVIS  Technique:  Multidetector CT imaging of the abdomen and pelvis was performed using the standard protocol during bolus administration of intravenous contrast.  Multiplanar reconstructed images including MIPs were obtained and reviewed to evaluate the vascular anatomy.  Contrast: OMNIPAQUE IOHEXOL 350 MG/ML SOLN  Comparison:   None.  Arterial findings: Extensive coronary arterial calcifications. Aorta:                  Patchy atheromatous calcifications predominately in the infrarenal segment.  No dissection, aneurysm, or stenosis.  Celiac axis:            Patent, unremarkable  Superior mesenteric:Patent, classic distal branching.  Left renal:             Solitary, with partially calcified ostial plaque resulting in at least mild stenosis, patent distally.  Right renal:            Solitary, widely patent  Inferior mesenteric:Patent  Left iliac:             Scattered nonocclusive plaque, otherwise unremarkable.  Right iliac:            Mild scattered nonocclusive plaque, otherwise unremarkable.  Venous findings:  Patent hepatic veins, portal vein, superior mesenteric vein, splenic vein, bilateral renal veins, IVC, and iliac venous system.   Review of the MIP images confirms the above findings.  Nonvascular findings: Minimal dependent atelectasis in the visualized lung bases.  Unremarkable liver, gallbladder, spleen, adrenal glands, left kidney.  4.7 cm right parapelvic renal cyst and a smaller 2.4 cm exophytic cystic lesion from the right renal lower  pole with some peripheral coarse calcifications.  No hydronephrosis.  Normal pancreas.  Stomach, small bowel, colon nondilated.  Normal appendix.  Scattered colonic diverticula most numerous in the sigmoid segment, without adjacent inflammatory/edematous change.  Scattered pelvic phleboliths. Urinary bladder is physiologically distended.  No ascites.  No free air.  Spondylitic changes in the lower lumbar spine.  No adenopathy.  IMPRESSION:  1.  Mild aortoiliac atheromatous plaque without significant visceral or renal artery stenosis or other significant vascular lesion. 2.  Scattered colonic diverticula.   Original Report Authenticated By: D. Andria Rhein, MD    Medications: I have reviewed the patient's current medications.  Assessment/Plan: GI Bleed from rt colon based on NM bleeding scan and colonoscopy, appears to be doing well post op.  Will follow socially.   Valleri Hendricksen JR,Aryanna Shaver L 07/07/2013, 10:55 AM

## 2013-07-07 NOTE — Progress Notes (Signed)
At 0830, pt self extubated.  Dr. Sung Amabile in room, pt calm and alert.  Pt oriented X4, no distress noted.

## 2013-07-07 NOTE — Progress Notes (Signed)
Inpatient Diabetes Program Recommendations  AACE/ADA: New Consensus Statement on Inpatient Glycemic Control (2013)  Target Ranges:  Prepandial:   less than 140 mg/dL      Peak postprandial:   less than 180 mg/dL (1-2 hours)      Critically ill patients:  140 - 180 mg/dL   Reason for Visit: Hyperglycemia  Results for Timothy [name], Timothy [name] (MRN [id number]) as of 07/07/2013 12:40  Ref. Range 07/06/2013 20:14 07/06/2013 23:36 07/07/2013 04:31 07/07/2013 07:43 07/07/2013 12:25  Glucose-Capillary Latest Range: 70-99 mg/dL 409 (H) 811 (H) 914 (H) 253 (H) 265 (H)    Inpatient Diabetes Program Recommendations Insulin - Basal: Consider addition of basal insulin if CBGs continue running >200 mg/dL Correction (SSI): Increase Novolog to moderate Q4 hours while NPO  Note: Will continue to follow.  Thank you. Ailene Ards, RD, LDN, CDE Inpatient Diabetes Coordinator [phone]

## 2013-07-08 LAB — BASIC METABOLIC PANEL
Chloride: 112 mEq/L (ref 96–112)
GFR calc Af Amer: >90 mL/min (ref >90)
GFR calc non Af Amer: 83 mL/min — ABNORMAL LOW (ref >90)
Glucose, Bld: 212 mg/dL — ABNORMAL HIGH (ref 70–99)
Potassium: 3.5 mEq/L (ref 3.5–5.1)
Sodium: 142 mEq/L (ref 135–145)

## 2013-07-08 LAB — CULTURE, BLOOD (ROUTINE X 2): Culture: NO GROWTH

## 2013-07-08 LAB — CBC
HCT: 23.2 % — ABNORMAL LOW (ref 39.0–52.0)
Hemoglobin: 8 g/dL — ABNORMAL LOW (ref 13.0–17.0)
RBC: 2.65 MIL/uL — ABNORMAL LOW (ref 4.22–5.81)

## 2013-07-08 LAB — GLUCOSE, CAPILLARY
Glucose-Capillary: 187 mg/dL — ABNORMAL HIGH (ref 70–99)
Glucose-Capillary: 196 mg/dL — ABNORMAL HIGH (ref 70–99)
Glucose-Capillary: 185 mg/dL — ABNORMAL HIGH (ref 70–99)
Glucose-Capillary: 210 mg/dL — ABNORMAL HIGH (ref 70–99)

## 2013-07-08 MED ORDER — SODIUM CHLORIDE 0.9 % IJ SOLN
10.0000 mL | INTRAMUSCULAR | Status: DC | PRN
  Administered 2013-07-08 (×3): 10 mL
  Administered 2013-07-09: 20 mL

## 2013-07-08 MED ORDER — DIPHENHYDRAMINE HCL 12.5 MG/5ML PO ELIX
12.5000 mg | ORAL_SOLUTION | Freq: Four times a day (QID) | ORAL | Status: DC | PRN
  Filled 2013-07-08: qty 5

## 2013-07-08 MED ORDER — NALOXONE HCL 0.4 MG/ML IJ SOLN: 0.4000 mg | INTRAMUSCULAR | Status: DC | PRN

## 2013-07-08 MED ORDER — HYDROMORPHONE 0.3 MG/ML IV SOLN
INTRAVENOUS | Status: DC
  Administered 2013-07-08 (×2): 25 mL via INTRAVENOUS
  Administered 2013-07-08: 5.4 mg via INTRAVENOUS
  Administered 2013-07-09: 2.7 mg via INTRAVENOUS
  Administered 2013-07-09: 2.4 mg via INTRAVENOUS
  Administered 2013-07-09: 2.31 mg via INTRAVENOUS
  Administered 2013-07-09: 2.1 mg via INTRAVENOUS
  Administered 2013-07-09: 2.7 mg via INTRAVENOUS
  Administered 2013-07-09: 4.5 mg via INTRAVENOUS
  Administered 2013-07-10: 1.2 mg via INTRAVENOUS
  Administered 2013-07-10: 08:00:00 via INTRAVENOUS
  Administered 2013-07-10: 0.5 mg via INTRAVENOUS
  Filled 2013-07-08 (×5): qty 25

## 2013-07-08 MED ORDER — DIPHENHYDRAMINE HCL 50 MG/ML IJ SOLN: 12.5000 mg | Freq: Four times a day (QID) | INTRAMUSCULAR | Status: DC | PRN

## 2013-07-08 MED ORDER — ONDANSETRON HCL 4 MG/2ML IJ SOLN: 4.0000 mg | Freq: Four times a day (QID) | INTRAMUSCULAR | Status: DC | PRN

## 2013-07-08 MED ORDER — SODIUM CHLORIDE 0.9 % IJ SOLN: 9.0000 mL | INTRAMUSCULAR | Status: DC | PRN

## 2013-07-08 MED ORDER — MORPHINE SULFATE 4 MG/ML IJ SOLN: 4.0000 mg | INTRAMUSCULAR | Status: DC | PRN

## 2013-07-08 NOTE — Progress Notes (Signed)
PT Cancellation Note  Patient Details Name: Timothy [name] MRN: [id number] DOB: 06/13/1941   Cancelled Treatment:    Reason Eval/Treat Not Completed: Fatigue/lethargy limiting ability to participate (Pt just back to bed.)   [facility] 07/08/2013, 4:17 PM

## 2013-07-08 NOTE — Progress Notes (Signed)
PULMONARY  / CRITICAL CARE MEDICINE  Name: Timothy [name] MRN: [id number] DOB: Feb 02, 1941    ADMISSION DATE:  07/01/2013 CONSULTATION DATE:  07/08/2013  REFERRING MD :  Dr. Thedore Mins, [facility] PRIMARY SERVICE: [facility]  CHIEF COMPLAINT:  GI Bleeding  BRIEF PATIENT DESCRIPTION: 72 year old male with history of extensive diverticulosis, s/p recent right inguinal hernia repair (06/29/13), CAD s/p stenting (DES and BES) in 2003, DM-2, and HTN with persistent lower GI bleed and hypotension.  CCM consulted for evaluation and central line placement.  SIGNIFICANT EVENTS / STUDIES:  7/27 - Admission 7/27 - GI consulted: rec supportive care and transfusion (as needed). 7/29 - Surgery consulation: No intervention recommended as exact source of bleeding unclear. 7/30 - Additional lower GI bleed and hypotension. PCCM consultation. 8 units of PRBCs transfused to date 7/30 - Visceral angiography: Negative celiac, SMA and IMA angiograms for active bleeding or acute vascular abnormality 7/30 - 4 units PRBCs 7/31 - 2 units PRBCs 7/31 Endoscopy: Bleeding appeared to be from R side of colon 7/31 Tagged RBC study: positive bleeding @ hepatic flexure 7/31 OR for R hemicolectomy 8/1 Self extubation - tolerating   LINES / TUBES: ETT 7/31 >> 8/1 Right IJ 7/30 >>   CULTURES: 7/27 Blood Cx >>> Negative  ANTIBIOTICS: None  SUBJECTIVE:  Awake, no focal def   VITAL SIGNS: Temp:  [97.6 F (36.4 C)-99 F (37.2 C)] 98.2 F (36.8 C) (08/02 0427) Pulse Rate:  [92-102] 102 (08/01 1100) Resp:  [6-18] 12 (08/02 0741) BP: (118-145)/(51-75) 127/61 mmHg (08/02 0600) SpO2:  [97 %-100 %] 99 % (08/02 0741) Weight:  [114.4 kg (252 lb 3.3 oz)] 114.4 kg (252 lb 3.3 oz) (08/02 0500) 3 liters  HEMODYNAMICS:     INTAKE / OUTPUT: Intake/Output     08/01 0701 - 08/02 0700 08/02 0701 - 08/03 0700   P.O.     I.V. (mL/kg) 1919.9 (16.8)    Blood     NG/GT     IV Piggyback     Total Intake(mL/kg) 1919.9 (16.8)     Urine (mL/kg/hr) 2945 (1.1)    Stool     Blood     Total Output 2945     Net -1025.1            PHYSICAL EXAMINATION: General:  Pale, NAD Neuro:  No focal deficits, oriented X 3 and appropriate  HEENT: WNL Cardiovascular:  RRR s M Lungs: Clear anteriorly Abdomen: soft, diminished BS, mildly tender, dressing mid-line and w/out drainage.  Musculoskeletal: No LE edema  LABS: CBC Recent Labs     07/06/13  2323  07/07/13  0434  07/08/13  0530  WBC  14.8*  12.3*  9.8  HGB  11.1*  10.3*  8.0*  HCT  32.1*  29.4*  23.2*  PLT  104*  96*  118*    Coag's Recent Labs     07/07/13  0434  APTT  27  INR  1.42    BMET Recent Labs     07/06/13  0415  07/06/13  2222  07/07/13  0434  07/08/13  0530  NA  141  142  143  142  K  3.5  4.4  4.0  3.5  CL  115*   --   115*  112  CO2  21   --   15*  21  BUN  21   --   18  12  CREATININE  1.00   --   1.04  0.88  GLUCOSE  161*  271*  271*  212*    Electrolytes Recent Labs     07/06/13  0415  07/07/13  0434  07/08/13  0530  CALCIUM  6.7*  6.4*  7.1*    Sepsis Markers No results found for this basename: LACTICACIDVEN, PROCALCITON, O2SATVEN,  in the last 72 hours  ABG Recent Labs     07/06/13  2357  07/07/13  0144  PHART  7.181*  7.273*  PCO2ART  29.9*  27.0*  PO2ART  294.0*  87.0    Liver Enzymes No results found for this basename: AST, ALT, ALKPHOS, BILITOT, ALBUMIN,  in the last 72 hours  Cardiac Enzymes No results found for this basename: TROPONINI, PROBNP,  in the last 72 hours  Glucose Recent Labs     07/07/13  1225  07/07/13  1542  07/07/13  1948  07/08/13  0008  07/08/13  0415  07/08/13  0705  GLUCAP  265*  217*  232*  210*  187*  196*    Imaging Nm Gi Blood Loss  07/06/2013   *RADIOLOGY REPORT*  Clinical Data: Acute lower GI bleed with colonoscopy today demonstrating blood and clots in the colon, more prominently at the level of the hepatic flexure and ascending colon.  NUCLEAR MEDICINE  GASTROINTESTINAL BLEEDING STUDY  Technique:  Sequential abdominal images were obtained following intravenous administration of Tc-58m labeled red blood cells.  Radiopharmaceutical: CURIE ULTRATAG TECHNETIUM TC 72M- LABELED RED BLOOD CELLS IV KIT  Comparison: CT angiography of the abdomen pelvis and mesenteric arteriography on 07/05/2013.  Findings: After administration of tag red blood cells, there is immediate positive bleeding identified at the level of the hepatic flexure of the colon.  Imaging was performed over 1 hour showing transit of positive colonic activity initially into the ascending colon and ultimately across the transverse colon and splenic flexure.  By the end of 1 hour of imaging, blood had reached the lower descending colon.  IMPRESSION: The bleeding scan is positive for acute bleeding at the level of the hepatic flexure of the colon.  Findings were communicated to Dr. Janee Morn during the examination.   Original Report Authenticated By: Irish Lack, M.D.   Portable Chest Xray  07/06/2013   *RADIOLOGY REPORT*  Clinical Data: Status post right hemicolectomy for lower GI bleed.  PORTABLE CHEST - 1 VIEW  Comparison: 07/05/2013  Findings: Central line positioning stable in the lower SVC. Endotracheal tube is present with the tip approximately 2 cm above the carina.  A nasogastric tube extends into the stomach.  Lungs show bibasilar atelectasis.  No pulmonary edema or focal airspace consolidation is seen.  The heart size and mediastinal contours are within normal limits.  No significant pleural effusions are identified.  IMPRESSION: Endotracheal tube tip is approximately 2 cm above the carina. Lungs show bibasilar atelectasis.   Original Report Authenticated By: Irish Lack, M.D.      Recent Labs Lab 07/07/13 1542 07/07/13 1948 07/08/13 0008 07/08/13 0415 07/08/13 0705  GLUCAP 217* 232* 210* 187* 196*   CXR: NNF   ASSESSMENT / PLAN:  PULMONARY A: post op resp failure,  appears resolved P:   Cont supplemental O2 as needed Mobilize and push pulm hygiene    CARDIOVASCULAR A:  Hemorrhagic shock, resolved H/O CAD s/p stenting Hx of HTN P:  Holding home antihypertensives Holding Plavix in the setting of active bleeding, w/ hgb drift need to hold another day. Would wait for surgical svc's ok to restart.  RENAL A:   No active issues Anasarca D/t volume resuscitation efforts. Hemodynamically stable. Hgb would suggest some degree of hemodilution.  P:   Keep hgb at current rate as NPO  Daily BMP   GASTROINTESTINAL A:  LGIB S/P R hemicolectomy 7/31 P:   Post op mgmt inc timing of diet per CCS  HEMATOLOGIC A:   Acute blood loss anemia, no further bleeding evident His hgb has drifted down 2 gms. He has no evidence of bleeding. This is probably hemodilution and just settling out. Spoke w/ surgery.  Thrombocytopenia: mild and improved.  Coagulopathy - consumptive and dilutional P:  Change CBCs to daily Correct coagulopathy as indicated  INFECTIOUS A:  No evidence of active infection P:   Micro and abx as above  ENDOCRINE A:  DM-2  P:   Cont q 4 SSI - will need to adjust scale and timing as nutrition started and advanced  NEUROLOGIC A:  Mild post op delirium-->resolved  Post op pain P: PCA fentanyl ordered  TODAY'S SUMMARY:  Looks great. Ready for transfer to SDU. Will mobilize, repeat h&h in am and d/c foley.   Reviewed above, examined pt, and agree with assessment/plan.  Much improved.  Will transfer to SDU.  Will ask [facility] to assume care from 8/03 and PCCM sign off.  Updated wife about plan.  Coralyn Helling, MD [facility] Pulmonary/Critical Care 07/08/2013, 1:44 PM Pager:  [phone] After 3pm call: [phone]

## 2013-07-08 NOTE — Progress Notes (Signed)
Patient ID: Timothy [name], male   DOB: 10/22/1941, 72 y.o.   MRN: [id number] 2 Days Post-Op  Subjective: Extubated, doing well.  Denies severe pain.    Objective: Vital signs in last 24 hours: Temp:  [97.6 F (36.4 C)-99 F (37.2 C)] 98.2 F (36.8 C) (08/02 0427) Pulse Rate:  [92-102] 102 (08/01 1100) Resp:  [6-18] 12 (08/02 0600) BP: (118-145)/(51-75) 127/61 mmHg (08/02 0600) SpO2:  [97 %-100 %] 100 % (08/02 0154) Weight:  [252 lb 3.3 oz (114.4 kg)] 252 lb 3.3 oz (114.4 kg) (08/02 0500) Last BM Date: 07/07/13  Intake/Output from previous day: 08/01 0701 - 08/02 0700 In: 1919.9 [I.V.:1919.9] Out: 2945 [Urine:2945] Intake/Output this shift:    General appearance: no distress Resp: clear to auscultation bilaterally Cardio: regular rate and rhythm GI: soft, quiet, dressing intact Neuro: F/C  Lab Results:   Recent Labs  07/07/13 0434 07/08/13 0530  WBC 12.3* 9.8  HGB 10.3* 8.0*  HCT 29.4* 23.2*  PLT 96* 118*   BMET  Recent Labs  07/07/13 0434 07/08/13 0530  NA 143 142  K 4.0 3.5  CL 115* 112  CO2 15* 21  GLUCOSE 271* 212*  BUN 18 12  CREATININE 1.04 0.88  CALCIUM 6.4* 7.1*   PT/INR  Recent Labs  07/07/13 0434  LABPROT 17.0*  INR 1.42   ABG  Recent Labs  07/06/13 2357 07/07/13 0144  PHART 7.181* 7.273*  HCO3 11.2* 12.6*    Studies/Results: Nm Gi Blood Loss  07/06/2013   *RADIOLOGY REPORT*  Clinical Data: Acute lower GI bleed with colonoscopy today demonstrating blood and clots in the colon, more prominently at the level of the hepatic flexure and ascending colon.  NUCLEAR MEDICINE GASTROINTESTINAL BLEEDING STUDY  Technique:  Sequential abdominal images were obtained following intravenous administration of Tc-69m labeled red blood cells.  Radiopharmaceutical: CURIE ULTRATAG TECHNETIUM TC 80M- LABELED RED BLOOD CELLS IV KIT  Comparison: CT angiography of the abdomen pelvis and mesenteric arteriography on 07/05/2013.  Findings: After  administration of tag red blood cells, there is immediate positive bleeding identified at the level of the hepatic flexure of the colon.  Imaging was performed over 1 hour showing transit of positive colonic activity initially into the ascending colon and ultimately across the transverse colon and splenic flexure.  By the end of 1 hour of imaging, blood had reached the lower descending colon.  IMPRESSION: The bleeding scan is positive for acute bleeding at the level of the hepatic flexure of the colon.  Findings were communicated to Dr. Janee Morn during the examination.   Original Report Authenticated By: Irish Lack, M.D.   Portable Chest Xray  07/06/2013   *RADIOLOGY REPORT*  Clinical Data: Status post right hemicolectomy for lower GI bleed.  PORTABLE CHEST - 1 VIEW  Comparison: 07/05/2013  Findings: Central line positioning stable in the lower SVC. Endotracheal tube is present with the tip approximately 2 cm above the carina.  A nasogastric tube extends into the stomach.  Lungs show bibasilar atelectasis.  No pulmonary edema or focal airspace consolidation is seen.  The heart size and mediastinal contours are within normal limits.  No significant pleural effusions are identified.  IMPRESSION: Endotracheal tube tip is approximately 2 cm above the carina. Lungs show bibasilar atelectasis.   Original Report Authenticated By: Irish Lack, M.D.    Anti-infectives: Anti-infectives   Start     Dose/Rate Route Frequency Ordered Stop   07/06/13 2015  cefOXitin (MEFOXIN) 2 g in dextrose 5 %  50 mL IVPB     2 g 100 mL/hr over 30 Minutes Intravenous  Once 07/06/13 2006 07/06/13 2113   07/02/13 0600  ciprofloxacin (CIPRO) IVPB 400 mg  Status:  Discontinued     400 mg 200 mL/hr over 60 Minutes Intravenous Every 12 hours 07/02/13 0137 07/03/13 1343   07/02/13 0145  metroNIDAZOLE (FLAGYL) IVPB 500 mg  Status:  Discontinued     500 mg 100 mL/hr over 60 Minutes Intravenous Every 8 hours 07/02/13 0137 07/03/13  1343      Assessment/Plan: s/p Procedure(s): EXPLORATORY LAPAROTOMY (Right) LGIB localized to hepatic flexure S/P R colectomy POD#2 - await return of bowel function, d/c NGT VDRF - resolved. ABL anemia - improved  LOS: 7 days    Ariann Khaimov 07/08/2013

## 2013-07-09 LAB — TYPE AND SCREEN
Unit division: 0
Unit division: 0
Unit division: 0
Unit division: 0
Unit division: 0
Unit division: 0

## 2013-07-09 LAB — COMPREHENSIVE METABOLIC PANEL
AST: 16 U/L (ref 0–37)
Albumin: 1.8 g/dL — ABNORMAL LOW (ref 3.5–5.2)
CO2: 23 mEq/L (ref 19–32)
Calcium: 7.5 mg/dL — ABNORMAL LOW (ref 8.4–10.5)
Creatinine, Ser: 0.9 mg/dL (ref 0.50–1.35)
GFR calc non Af Amer: 82 mL/min — ABNORMAL LOW (ref >90)
Sodium: 143 mEq/L (ref 135–145)
Total Protein: 4.1 g/dL — ABNORMAL LOW (ref 6.0–8.3)

## 2013-07-09 LAB — CBC
MCH: 30.5 pg (ref 26.0–34.0)
MCHC: 34 g/dL (ref 30.0–36.0)
MCV: 90 fL (ref 78.0–100.0)
Platelets: 146 10*3/uL — ABNORMAL LOW (ref 150–400)
RDW: 16.6 % — ABNORMAL HIGH (ref 11.5–15.5)

## 2013-07-09 LAB — GLUCOSE, CAPILLARY
Glucose-Capillary: 194 mg/dL — ABNORMAL HIGH (ref 70–99)
Glucose-Capillary: 204 mg/dL — ABNORMAL HIGH (ref 70–99)
Glucose-Capillary: 193 mg/dL — ABNORMAL HIGH (ref 70–99)

## 2013-07-09 NOTE — Evaluation (Signed)
Physical Therapy Evaluation Patient Details Name: Timothy [name] MRN: [id number] DOB: May 25, 1941 Today's Date: 07/09/2013 Time: 4098-1191 PT Time Calculation (min): 28 min  PT Assessment / Plan / Recommendation History of Present Illness  72 year old male with history of extensive diverticulosis, s/p recent right inguinal hernia repair (06/29/13) with persistent lower GI bleed and hypotension.  Pt transfused >10 units of blood.  Underwent rt hemicolectomy on 07/06/13.  Clinical Impression  Pt admitted with above. Pt currently with functional limitations due to the deficits listed below (see PT Problem List).   Pt will benefit from skilled PT to increase their independence and safety with mobility to allow discharge home with wife.      PT Assessment  Patient needs continued PT services    Follow Up Recommendations  Home health PT;Supervision/Assistance - 24 hour (assuming pt will be in hospital several more days).    Does the patient have the potential to tolerate intense rehabilitation      Barriers to Discharge        Equipment Recommendations  Rolling walker with 5" wheels    Recommendations for Other Services     Frequency Min 3X/week    Precautions / Restrictions Precautions Precautions: Fall   Pertinent Vitals/Pain See flow sheet.      Mobility  Bed Mobility Bed Mobility: Supine to Sit;Sitting - Scoot to Edge of Bed Supine to Sit: 4: Min assist;HOB elevated;With rails Sitting - Scoot to Edge of Bed: 4: Min assist Details for Bed Mobility Assistance: Assist to bring trunk up. Transfers Transfers: Sit to Stand;Stand to Sit Sit to Stand: 4: Min assist;With upper extremity assist;From bed;From elevated surface Stand to Sit: 4: Min assist;With upper extremity assist;With armrests;To chair/3-in-1 Details for Transfer Assistance: Verbal cues for hand placement and assist to bring hips up. Ambulation/Gait Ambulation/Gait Assistance: 4: Min assist Ambulation Distance  (Feet): 6 Feet Assistive device: Rolling walker Ambulation/Gait Assistance Details: Assist for balance. Pt with posterior lean initially and while backing up to chair. Gait Pattern: Step-to pattern;Decreased step length - right;Decreased step length - left;Shuffle Gait velocity: decr    Exercises     PT Diagnosis: Difficulty walking;Generalized weakness  PT Problem List: Decreased strength;Decreased activity tolerance;Decreased balance;Decreased mobility PT Treatment Interventions: DME instruction;Gait training;Stair training;Functional mobility training;Therapeutic activities;Therapeutic exercise;Balance training;Patient/family education     PT Goals(Current goals can be found in the care plan section) Acute Rehab PT Goals Patient Stated Goal: Return home PT Goal Formulation: With patient Time For Goal Achievement: 07/16/13 Potential to Achieve Goals: Good  Visit Information  Last PT Received On: 07/09/13 Assistance Needed: +2 (helpful to follow with chair to incr amb distance) History of Present Illness: 72 year old male with history of extensive diverticulosis, s/p recent right inguinal hernia repair (06/29/13) with persistent lower GI bleed and hypotension.  Pt transfused >10 units of blood.  Underwent rt hemicolectomy on 07/06/13.       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 Communication Communication: No difficulties    Cognition  Cognition Arousal/Alertness: Awake/alert Behavior During Therapy: WFL for tasks assessed/performed Overall Cognitive Status:  Within Functional Limits for tasks assessed    Extremity/Trunk Assessment Upper Extremity Assessment Upper  Extremity Assessment: Generalized weakness Lower Extremity Assessment Lower Extremity Assessment: Generalized weakness   Balance Static Sitting Balance Static Sitting - Balance Support: Bilateral upper extremity supported Static Sitting - Level of Assistance: 5: Stand by assistance Static Standing Balance Static Standing - Balance Support: Bilateral upper extremity supported Static Standing - Level of Assistance: 4: Min assist  End of Session PT - End of Session Equipment Utilized During Treatment: Oxygen Activity Tolerance: Patient limited by fatigue Patient left: in chair;with call bell/phone within reach;with family/visitor present Nurse Communication: Mobility status  GP     [facility] 07/09/2013, 10:41 AM  [employer] PT [phone]

## 2013-07-09 NOTE — Progress Notes (Signed)
Pt resting comfortable on bed, denies any pain or discomfort at this time, VSS, family at the bedside, no distress noticed. Pt continues on PCA pump for pain management as ordered. We'll continue with POC.

## 2013-07-09 NOTE — Progress Notes (Signed)
3 Days Post-Op  Subjective: Looks good no complaints pain well controlled. No flatus but no N/V  Objective: Vital signs in last 24 hours: Temp:  [98 F (36.7 C)-98.5 F (36.9 C)] 98 F (36.7 C) (08/03 0429) Pulse Rate:  [95-97] 95 (08/03 0429) Resp:  [9-23] 13 (08/03 0613) BP: (119-133)/(51-68) 120/62 mmHg (08/03 0429) SpO2:  [96 %-99 %] 99 % (08/03 0613) Weight:  [245 lb 9.5 oz (111.4 kg)-245 lb 13 oz (111.5 kg)] 245 lb 13 oz (111.5 kg) (08/03 0429) Last BM Date: 07/07/13  Intake/Output from previous day: 08/02 0701 - 08/03 0700 In: 600 [I.V.:600] Out: 1175 [Urine:1175] Intake/Output this shift:    Resp: clear to auscultation bilaterally Cardio: regular rate and rhythm, S1, S2 normal, no murmur, click, rub or gallop Incision/Wound:CLEAN DRY INTACT SOFT BS PRESENT  Lab Results:   Recent Labs  07/08/13 0530 07/09/13 0500  WBC 9.8 9.6  HGB 8.0* 7.3*  HCT 23.2* 21.5*  PLT 118* 146*   BMET  Recent Labs  07/08/13 0530 07/09/13 0500  NA 142 143  K 3.5 3.6  CL 112 113*  CO2 21 23  GLUCOSE 212* 206*  BUN 12 12  CREATININE 0.88 0.90  CALCIUM 7.1* 7.5*   PT/INR  Recent Labs  07/07/13 0434  LABPROT 17.0*  INR 1.42   ABG  Recent Labs  07/06/13 2357 07/07/13 0144  PHART 7.181* 7.273*  HCO3 11.2* 12.6*    Studies/Results: No results found.  Anti-infectives: Anti-infectives   Start     Dose/Rate Route Frequency Ordered Stop   07/06/13 2015  cefOXitin (MEFOXIN) 2 g in dextrose 5 % 50 mL IVPB     2 g 100 mL/hr over 30 Minutes Intravenous  Once 07/06/13 2006 07/06/13 2113   07/02/13 0600  ciprofloxacin (CIPRO) IVPB 400 mg  Status:  Discontinued     400 mg 200 mL/hr over 60 Minutes Intravenous Every 12 hours 07/02/13 0137 07/03/13 1343   07/02/13 0145  metroNIDAZOLE (FLAGYL) IVPB 500 mg  Status:  Discontinued     500 mg 100 mL/hr over 60 Minutes Intravenous Every 8 hours 07/02/13 0137 07/03/13 1343      Assessment/Plan: s/p  Procedure(s): EXPLORATORY LAPAROTOMY (Right) LGIB localized to hepatic flexure  S/P R colectomy POD#3  Adv diet VDRF - resolved.  ABL anemia - hgb down to 7.3 but asymtomatic  Would not transfuse until less than 7.    LOS: 8 days    Cyan Moultrie A. 07/09/2013

## 2013-07-09 NOTE — Progress Notes (Addendum)
[facility] PROGRESS NOTE  Timothy [name] RUE:[id number] DOB: February 03, 1941 DOA: 07/01/2013 PCP: [name], MD  Assessment/Plan: 1. GI Bleed; S/P dy 3  RT hemicolectomy Cont NPO per surgery. Pain controlled. H/H stable 2. Hypotension; Resolved 3. Leukocytosis; obtain CBC with differential to determine if patient WBC increase his resolve. On no ABx  4. DM; increased SS I to moderate, obtain hemoglobin A1c    Code Status: Full Family Communication: Wife present for discussion of Plan of Care) Disposition Plan: per Surgery   Consultants: GI, Surgery HPI/Subjective:   SIGNIFICANT EVENTS / STUDIES:  7/27 - Admission  7/27 - GI consulted: rec supportive care and transfusion (as needed).  7/29 - Surgery consulation: No interven states panic controlled todaytion recommended as exact source of bleeding unclear.  7/30 - Additional lower GI bleed and hypotension. PCCM consultation. 8 units of PRBCs transfused to date  7/30 - Visceral angiography: Negative celiac, SMA and IMA angiograms for active bleeding or acute vascular abnormality  7/30 - 4 units PRBCs  7/31 - 2 units PRBCs  7/31 Endoscopy: Bleeding appeared to be from R side of colon  7/31 Tagged RBC study: positive bleeding @ hepatic flexure  7/31 OR for R hemicolectomy  8/1 Self extubation - tolerating TODAY States Pain under control. Sitting in chair comfortably, Requests to know when he will be able to eat       Objective: Filed Vitals:   07/09/13 0429 07/09/13 0613 07/09/13 1245 07/09/13 1516  BP: 120/62   127/59  Pulse: 95   88  Temp: 98 F (36.7 C)   97.9 F (36.6 C)  TempSrc: Oral   Oral  Resp: 12 13 15 17   Height:      Weight: 111.5 kg (245 lb 13 oz)     SpO2: 96% 99% 97% 96%    Intake/Output Summary (Last 24 hours) at 07/09/13 1703 Last data filed at 07/09/13 1645  Gross per 24 hour  Intake    100 ml  Output   1225 ml  Net  -1125 ml   Filed Weights   07/08/13 0500 07/08/13 1918 07/09/13  0429  Weight: 114.4 kg (252 lb 3.3 oz) 111.4 kg (245 lb 9.5 oz) 111.5 kg (245 lb 13 oz)    Exam:   General: Alert,NAD(PCA pump present)   Cardiovascular:rhythm and rate, negative murmurs rubs or gallops, DP/PT pulse 2+ Respiratory:   Abdomen: clear to auscultation bilateral    Musculoskeletal: Abdomen has a binder on, no sign of bleeding, appropriately tender to palpation**   Data Reviewed: Basic Metabolic Panel:  Recent Labs Lab 07/04/13 0223 07/06/13 0415 07/06/13 2222 07/07/13 0434 07/08/13 0530 07/09/13 0500  NA 137 141 142 143 142 143  K 4.2 3.5 4.4 4.0 3.5 3.6  CL 108 115*  --  115* 112 113*  CO2 18* 21  --  15* 21 23  GLUCOSE 213* 161* 271* 271* 212* 206*  BUN 18 21  --  18 12 12   CREATININE 1.03 1.00  --  1.04 0.88 0.90  CALCIUM 6.9* 6.7*  --  6.4* 7.1* 7.5*   Liver Function Tests:  Recent Labs Lab 07/09/13 0500  AST 16  ALT 10  ALKPHOS 39  BILITOT 0.7  PROT 4.1*  ALBUMIN 1.8*   No results found for this basename: LIPASE, AMYLASE,  in the last 168 hours No results found for this basename: AMMONIA,  in the last 168 hours CBC:  Recent Labs Lab 07/06/13 1644 07/06/13 2222 07/06/13 2323 07/07/13  0434 07/08/13 0530 07/09/13 0500  WBC 7.6  --  14.8* 12.3* 9.8 9.6  HGB 7.3* 10.5* 11.1* 10.3* 8.0* 7.3*  HCT 21.1* 31.0* 32.1* 29.4* 23.2* 21.5*  MCV 86.5  --  88.7 87.0 87.5 90.0  PLT 121*  --  104* 96* 118* 146*   Cardiac Enzymes: No results found for this basename: CKTOTAL, CKMB, CKMBINDEX, TROPONINI,  in the last 168 hours BNP (last 3 results) No results found for this basename: PROBNP,  in the last 8760 hours CBG:  Recent Labs Lab 07/08/13 2007 07/09/13 0018 07/09/13 0307 07/09/13 1223 07/09/13 1613  GLUCAP 210* 205* 201* 184* 204*    Recent Results (from the past 240 hour(s))  MRSA PCR SCREENING     Status: None   Collection Time    07/02/13  3:01 AM      Result Value Range Status   MRSA by PCR NEGATIVE  NEGATIVE Final    Comment:            The GeneXpert MRSA Assay (FDA     approved for NASAL specimens     only), is one component of a     comprehensive MRSA colonization     surveillance program. It is not     intended to diagnose MRSA     infection nor to guide or     monitor treatment for     MRSA infections.  CULTURE, BLOOD (ROUTINE X 2)     Status: None   Collection Time    07/02/13 12:40 PM      Result Value Range Status   Specimen Description BLOOD RIGHT ARM   Final   Special Requests BOTTLES DRAWN AEROBIC AND ANAEROBIC 10CC   Final   Culture  Setup Time 07/02/2013 20:30   Final   Culture NO GROWTH 5 DAYS   Final   Report Status 07/08/2013 FINAL   Final  CULTURE, BLOOD (ROUTINE X 2)     Status: None   Collection Time    07/02/13 12:45 PM      Result Value Range Status   Specimen Description BLOOD RIGHT HAND   Final   Special Requests BOTTLES DRAWN AEROBIC ONLY 10CC   Final   Culture  Setup Time 07/02/2013 20:35   Final   Culture NO GROWTH 5 DAYS   Final   Report Status 07/08/2013 FINAL   Final     Studies: No results found.  Scheduled Meds: . antiseptic oral rinse  15 mL Mouth Rinse q12n4p  . chlorhexidine  15 mL Mouth Rinse BID  . HYDROmorphone PCA 0.3 mg/mL   Intravenous Q4H  . insulin aspart  0-9 Units Subcutaneous Q4H  . pantoprazole (PROTONIX) IV  40 mg Intravenous Q24H  . sodium chloride  3 mL Intravenous Q12H   Continuous Infusions: . dextrose 5 % and 0.45 % NaCl with KCl 20 mEq/L 1,000 mL (07/08/13 1635)    Principal Problem:   Hemorrhagic shock Active Problems:   CAD, NATIVE VESSEL   Diabetes   Acute lower GI bleeding   Abdominal pain   Acute blood loss anemia   Respiratory failure, post-operative    Time spent: 40 min    [name], J  [facility] Pager [phone]. If 7PM-7AM, please contact night-coverage at [email or web address], password [email or web address] 07/09/2013, 5:03 PM  LOS: 8 days

## 2013-07-10 LAB — CBC WITH DIFFERENTIAL/PLATELET
Basophils Absolute: 0 10*3/uL (ref 0.0–0.1)
Basophils Relative: 0 % (ref 0–1)
Eosinophils Absolute: 0.4 10*3/uL (ref 0.0–0.7)
HCT: 20 % — ABNORMAL LOW (ref 39.0–52.0)
Hemoglobin: 6.7 g/dL — CL (ref 13.0–17.0)
MCH: 30.3 pg (ref 26.0–34.0)
MCHC: 33.5 g/dL (ref 30.0–36.0)
Monocytes Absolute: 0.5 10*3/uL (ref 0.1–1.0)
Monocytes Relative: 7 % (ref 3–12)
RDW: 16.4 % — ABNORMAL HIGH (ref 11.5–15.5)

## 2013-07-10 LAB — GLUCOSE, CAPILLARY
Glucose-Capillary: 164 mg/dL — ABNORMAL HIGH (ref 70–99)
Glucose-Capillary: 175 mg/dL — ABNORMAL HIGH (ref 70–99)
Glucose-Capillary: 180 mg/dL — ABNORMAL HIGH (ref 70–99)
Glucose-Capillary: 187 mg/dL — ABNORMAL HIGH (ref 70–99)
Glucose-Capillary: 260 mg/dL — ABNORMAL HIGH (ref 70–99)

## 2013-07-10 LAB — COMPREHENSIVE METABOLIC PANEL
Albumin: 1.8 g/dL — ABNORMAL LOW (ref 3.5–5.2)
BUN: 12 mg/dL (ref 6–23)
Calcium: 7.5 mg/dL — ABNORMAL LOW (ref 8.4–10.5)
Creatinine, Ser: 0.85 mg/dL (ref 0.50–1.35)
Total Bilirubin: 0.6 mg/dL (ref 0.3–1.2)
Total Protein: 4.1 g/dL — ABNORMAL LOW (ref 6.0–8.3)

## 2013-07-10 MED ORDER — INSULIN ASPART 100 UNIT/ML ~~LOC~~ SOLN
0.0000 [IU] | SUBCUTANEOUS | Status: DC
  Administered 2013-07-10: 8 [IU] via SUBCUTANEOUS
  Administered 2013-07-10 (×2): 3 [IU] via SUBCUTANEOUS

## 2013-07-10 MED ORDER — FUROSEMIDE 10 MG/ML IJ SOLN
20.0000 mg | Freq: Once | INTRAMUSCULAR | Status: AC
  Administered 2013-07-10: 20 mg via INTRAVENOUS
  Filled 2013-07-10: qty 2

## 2013-07-10 MED ORDER — INSULIN ASPART 100 UNIT/ML ~~LOC~~ SOLN
0.0000 [IU] | SUBCUTANEOUS | Status: DC
  Administered 2013-07-10 – 2013-07-11 (×3): 4 [IU] via SUBCUTANEOUS
  Administered 2013-07-11: 7 [IU] via SUBCUTANEOUS
  Administered 2013-07-11: 11 [IU] via SUBCUTANEOUS
  Administered 2013-07-11 – 2013-07-12 (×3): 4 [IU] via SUBCUTANEOUS

## 2013-07-10 MED ORDER — HYDROMORPHONE HCL PF 1 MG/ML IJ SOLN
0.5000 mg | INTRAMUSCULAR | Status: DC | PRN
  Administered 2013-07-10 (×2): 1 mg via INTRAVENOUS
  Administered 2013-07-10: 2 mg via INTRAVENOUS
  Administered 2013-07-10: 1 mg via INTRAVENOUS
  Administered 2013-07-11: 2 mg via INTRAVENOUS
  Filled 2013-07-10 (×3): qty 1
  Filled 2013-07-10 (×2): qty 2

## 2013-07-10 MED ORDER — PANTOPRAZOLE SODIUM 40 MG PO TBEC
40.0000 mg | DELAYED_RELEASE_TABLET | Freq: Every day | ORAL | Status: DC
  Administered 2013-07-10 – 2013-07-13 (×4): 40 mg via ORAL
  Filled 2013-07-10 (×4): qty 1

## 2013-07-10 MED ORDER — BACITRACIN-NEOMYCIN-POLYMYXIN OINTMENT TUBE
TOPICAL_OINTMENT | CUTANEOUS | Status: DC | PRN
  Filled 2013-07-10: qty 15

## 2013-07-10 NOTE — Progress Notes (Signed)
PT Cancellation Note  Patient Details Name: Timothy [name] MRN: [id number] DOB: 24-Dec-1940   Cancelled Treatment:    Reason Eval/Treat Not Completed: Medical issues which prohibited therapy. Hgb 6.7.      Verdell Face, [place] [phone] 07/10/2013

## 2013-07-10 NOTE — Progress Notes (Signed)
07/10/13-1045  Critical lab hgb this am, 6.7  MD notified. Anastasia Fiedler RN

## 2013-07-10 NOTE — Progress Notes (Signed)
[facility] PROGRESS NOTE  Timothy [name] ZOX:[id number] DOB: Nov 19, 1941 DOA: 07/01/2013 PCP: [name], MD  Assessment/Plan: 1. GI Bleed; S/P dy 4  RT hemicolectomy started on clear liquids this a.m. by surgery. PCA discontinued and patient started on oral medication for pain.  Hemoglobin dropped to 6.8 today (surgery goal>7.0) initiated 2 units PRBC transfusion with Lasix between units 2. Hypotension; Resolved 3. Leukocytosis; resolved   4. DM; patient's CBGs continue to run high will increased SS I to resistant, obtain hemoglobin A1c    Code Status: Full Family Communication: Wife present for discussion of Plan of Care) Disposition Plan: per Surgery   Consultants: GI, Surgery HPI/Subjective:   SIGNIFICANT EVENTS / STUDIES:  7/27 - Admission  7/27 - GI consulted: rec supportive care and transfusion (as needed).  7/29 - Surgery consulation: No interven states panic controlled todaytion recommended as exact source of bleeding unclear.  7/30 - Additional lower GI bleed and hypotension. PCCM consultation. 8 units of PRBCs transfused to date  7/30 - Visceral angiography: Negative celiac, SMA and IMA angiograms for active bleeding or acute vascular abnormality  7/30 - 4 units PRBCs  7/31 - 2 units PRBCs  7/31 Endoscopy: Bleeding appeared to be from R side of colon  7/31 Tagged RBC study: positive bleeding @ hepatic flexure  7/31 OR for R hemicolectomy  8/1 Self extubation - tolerating TODAY States Pain under control. Sitting in chair comfortably, Requests to know when he will be able to eat       Objective: Filed Vitals:   07/10/13 0807 07/10/13 1256 07/10/13 1700 07/10/13 1716  BP:  121/54 111/50 117/48  Pulse:  90 85 88  Temp:  98 F (36.7 C) 98.3 F (36.8 C) 98.6 F (37 C)  TempSrc:  Oral Oral Oral  Resp: 16 18 18 18   Height:      Weight:      SpO2: 100% 91%      Intake/Output Summary (Last 24 hours) at 07/10/13 1926 Last data filed at 07/10/13  1716  Gross per 24 hour  Intake    900 ml  Output    700 ml  Net    200 ml   Filed Weights   07/08/13 1918 07/09/13 0429 07/10/13 0516  Weight: 111.4 kg (245 lb 9.5 oz) 111.5 kg (245 lb 13 oz) 110.4 kg (243 lb 6.2 oz)    Exam:   General: Alert,NAD(PCA pump present)   Cardiovascular:rhythm and rate, negative murmurs rubs or gallops, DP/PT pulse 2+ Respiratory:   Abdomen: clear to auscultation bilateral    Musculoskeletal: Abdomen has a binder on, no sign of bleeding, appropriately tender to palpation**   Data Reviewed: Basic Metabolic Panel:  Recent Labs Lab 07/06/13 0415 07/06/13 2222 07/07/13 0434 07/08/13 0530 07/09/13 0500 07/10/13 1015  NA 141 142 143 142 143 143  K 3.5 4.4 4.0 3.5 3.6 3.4*  CL 115*  --  115* 112 113* 111  CO2 21  --  15* 21 23 24   GLUCOSE 161* 271* 271* 212* 206* 190*  BUN 21  --  18 12 12 12   CREATININE 1.00  --  1.04 0.88 0.90 0.85  CALCIUM 6.7*  --  6.4* 7.1* 7.5* 7.5*   Liver Function Tests:  Recent Labs Lab 07/09/13 0500 07/10/13 1015  AST 16 19  ALT 10 12  ALKPHOS 39 39  BILITOT 0.7 0.6  PROT 4.1* 4.1*  ALBUMIN 1.8* 1.8*   No results found for this basename: LIPASE, AMYLASE,  in the last 168 hours No results found for this basename: AMMONIA,  in the last 168 hours CBC:  Recent Labs Lab 07/06/13 2323 07/07/13 0434 07/08/13 0530 07/09/13 0500 07/10/13 1015  WBC 14.8* 12.3* 9.8 9.6 7.7  NEUTROABS  --   --   --   --  6.2  HGB 11.1* 10.3* 8.0* 7.3* 6.7*  HCT 32.1* 29.4* 23.2* 21.5* 20.0*  MCV 88.7 87.0 87.5 90.0 90.5  PLT 104* 96* 118* 146* 179   Cardiac Enzymes: No results found for this basename: CKTOTAL, CKMB, CKMBINDEX, TROPONINI,  in the last 168 hours BNP (last 3 results) No results found for this basename: PROBNP,  in the last 8760 hours CBG:  Recent Labs Lab 07/09/13 2312 07/10/13 0409 07/10/13 0730 07/10/13 1133 07/10/13 1558  GLUCAP 180* 173* 187* 260* 194*    Recent Results (from the past 240  hour(s))  MRSA PCR SCREENING     Status: None   Collection Time    07/02/13  3:01 AM      Result Value Range Status   MRSA by PCR NEGATIVE  NEGATIVE Final   Comment:            The GeneXpert MRSA Assay (FDA     approved for NASAL specimens     only), is one component of a     comprehensive MRSA colonization     surveillance program. It is not     intended to diagnose MRSA     infection nor to guide or     monitor treatment for     MRSA infections.  CULTURE, BLOOD (ROUTINE X 2)     Status: None   Collection Time    07/02/13 12:40 PM      Result Value Range Status   Specimen Description BLOOD RIGHT ARM   Final   Special Requests BOTTLES DRAWN AEROBIC AND ANAEROBIC 10CC   Final   Culture  Setup Time 07/02/2013 20:30   Final   Culture NO GROWTH 5 DAYS   Final   Report Status 07/08/2013 FINAL   Final  CULTURE, BLOOD (ROUTINE X 2)     Status: None   Collection Time    07/02/13 12:45 PM      Result Value Range Status   Specimen Description BLOOD RIGHT HAND   Final   Special Requests BOTTLES DRAWN AEROBIC ONLY 10CC   Final   Culture  Setup Time 07/02/2013 20:35   Final   Culture NO GROWTH 5 DAYS   Final   Report Status 07/08/2013 FINAL   Final     Studies: No results found.  Scheduled Meds: . antiseptic oral rinse  15 mL Mouth Rinse q12n4p  . chlorhexidine  15 mL Mouth Rinse BID  . furosemide  20 mg Intravenous Once  . insulin aspart  0-15 Units Subcutaneous Q4H  . pantoprazole  40 mg Oral Daily  . sodium chloride  3 mL Intravenous Q12H   Continuous Infusions: . dextrose 5 % and 0.45 % NaCl with KCl 20 mEq/L 1,000 mL (07/08/13 1635)    Principal Problem:   Hemorrhagic shock Active Problems:   CAD, NATIVE VESSEL   Diabetes   Acute lower GI bleeding   Abdominal pain   Acute blood loss anemia   Respiratory failure, post-operative    Time spent: 40 min    WOODS, CURTIS, J  [facility] Pager [phone]. If 7PM-7AM, please contact night-coverage at  [email or web address], password [facility] 07/10/2013, 7:26 PM  LOS: 9  days

## 2013-07-10 NOTE — Progress Notes (Signed)
Agree with above 

## 2013-07-10 NOTE — Progress Notes (Signed)
Patient ID: Timothy [name], male   DOB: 1941-11-17, 72 y.o.   MRN: [id number] 4 Days Post-Op  Subjective: Pt feeling well this morning.  Pain only around a 4.  Some flatus.  No nausea  Objective: Vital signs in last 24 hours: Temp:  [97.9 F (36.6 C)-98.3 F (36.8 C)] 98.1 F (36.7 C) (08/04 0516) Pulse Rate:  [87-93] 87 (08/04 0516) Resp:  [8-17] 16 (08/04 0807) BP: (121-155)/(52-69) 121/52 mmHg (08/04 0516) SpO2:  [96 %-100 %] 100 % (08/04 0807) Weight:  [243 lb 6.2 oz (110.4 kg)] 243 lb 6.2 oz (110.4 kg) (08/04 0516) Last BM Date: 07/07/13  Intake/Output from previous day: 08/03 0701 - 08/04 0700 In: 20 [I.V.:20] Out: 1150 [Urine:1150] Intake/Output this shift:    PE: Abd: soft, appropriately tender, +BS, ND, incision c/d/i with staples.  Scrotal edema  Lab Results:   Recent Labs  07/08/13 0530 07/09/13 0500  WBC 9.8 9.6  HGB 8.0* 7.3*  HCT 23.2* 21.5*  PLT 118* 146*   BMET  Recent Labs  07/08/13 0530 07/09/13 0500  NA 142 143  K 3.5 3.6  CL 112 113*  CO2 21 23  GLUCOSE 212* 206*  BUN 12 12  CREATININE 0.88 0.90  CALCIUM 7.1* 7.5*   PT/INR No results found for this basename: LABPROT, INR,  in the last 72 hours CMP     Component Value Date/Time   NA 143 07/09/2013 0500   K 3.6 07/09/2013 0500   CL 113* 07/09/2013 0500   CO2 23 07/09/2013 0500   GLUCOSE 206* 07/09/2013 0500   BUN 12 07/09/2013 0500   CREATININE 0.90 07/09/2013 0500   CALCIUM 7.5* 07/09/2013 0500   PROT 4.1* 07/09/2013 0500   ALBUMIN 1.8* 07/09/2013 0500   AST 16 07/09/2013 0500   ALT 10 07/09/2013 0500   ALKPHOS 39 07/09/2013 0500   BILITOT 0.7 07/09/2013 0500   GFRNONAA 82* 07/09/2013 0500   GFRAA >90 07/09/2013 0500   Lipase  No results found for this basename: lipase       Studies/Results: No results found.  Anti-infectives: Anti-infectives   Start     Dose/Rate Route Frequency Ordered Stop   07/06/13 2015  cefOXitin (MEFOXIN) 2 g in dextrose 5 % 50 mL IVPB     2 g 100 mL/hr over 30  Minutes Intravenous  Once 07/06/13 2006 07/06/13 2113   07/02/13 0600  ciprofloxacin (CIPRO) IVPB 400 mg  Status:  Discontinued     400 mg 200 mL/hr over 60 Minutes Intravenous Every 12 hours 07/02/13 0137 07/03/13 1343   07/02/13 0145  metroNIDAZOLE (FLAGYL) IVPB 500 mg  Status:  Discontinued     500 mg 100 mL/hr over 60 Minutes Intravenous Every 8 hours 07/02/13 0137 07/03/13 1343       Assessment/Plan  1. LGI bleed 2. S/p right colectomy 3. Post op ileus, resolving  Plan: 1. Advance to clear liquids 2. He wants to dc his PCA and just use IV push for today since he is using his PCA so little 3. Elevate scrotum for scrotal swelling. 4. Ambulate in halls TID   LOS: 9 days    Ila Landowski E 07/10/2013, 8:19 AM Pager: [phone]

## 2013-07-11 LAB — CBC WITH DIFFERENTIAL/PLATELET
Basophils Absolute: 0 10*3/uL (ref 0.0–0.1)
Eosinophils Relative: 5 % (ref 0–5)
HCT: 25.9 % — ABNORMAL LOW (ref 39.0–52.0)
Hemoglobin: 8.6 g/dL — ABNORMAL LOW (ref 13.0–17.0)
Lymphocytes Relative: 8 % — ABNORMAL LOW (ref 12–46)
Lymphs Abs: 0.6 10*3/uL — ABNORMAL LOW (ref 0.7–4.0)
MCV: 88.7 fL (ref 78.0–100.0)
Monocytes Absolute: 0.7 10*3/uL (ref 0.1–1.0)
Monocytes Relative: 9 % (ref 3–12)
RDW: 15.9 % — ABNORMAL HIGH (ref 11.5–15.5)
WBC: 8.2 10*3/uL (ref 4.0–10.5)

## 2013-07-11 LAB — COMPREHENSIVE METABOLIC PANEL
BUN: 11 mg/dL (ref 6–23)
CO2: 26 mEq/L (ref 19–32)
Calcium: 7.8 mg/dL — ABNORMAL LOW (ref 8.4–10.5)
Chloride: 107 mEq/L (ref 96–112)
Creatinine, Ser: 0.92 mg/dL (ref 0.50–1.35)
GFR calc Af Amer: >90 mL/min (ref >90)
GFR calc non Af Amer: 81 mL/min — ABNORMAL LOW (ref >90)
Glucose, Bld: 188 mg/dL — ABNORMAL HIGH (ref 70–99)
Total Bilirubin: 1 mg/dL (ref 0.3–1.2)

## 2013-07-11 LAB — TYPE AND SCREEN
ABO/RH(D): O NEG
Antibody Screen: NEGATIVE

## 2013-07-11 LAB — HEMOGLOBIN A1C
Hgb A1c MFr Bld: 6.1 % — ABNORMAL HIGH (ref <5.7)
Mean Plasma Glucose: 128 mg/dL — ABNORMAL HIGH (ref <117)

## 2013-07-11 LAB — GLUCOSE, CAPILLARY
Glucose-Capillary: 173 mg/dL — ABNORMAL HIGH (ref 70–99)
Glucose-Capillary: 203 mg/dL — ABNORMAL HIGH (ref 70–99)

## 2013-07-11 MED ORDER — OXYCODONE-ACETAMINOPHEN 5-325 MG PO TABS
1.0000 | ORAL_TABLET | ORAL | Status: DC | PRN
  Administered 2013-07-12 (×3): 1 via ORAL
  Administered 2013-07-12 – 2013-07-13 (×4): 2 via ORAL
  Filled 2013-07-11: qty 2
  Filled 2013-07-11: qty 1
  Filled 2013-07-11: qty 2
  Filled 2013-07-11: qty 1
  Filled 2013-07-11 (×3): qty 2

## 2013-07-11 MED ORDER — INSULIN GLARGINE 100 UNIT/ML ~~LOC~~ SOLN
10.0000 [IU] | Freq: Every day | SUBCUTANEOUS | Status: DC
  Administered 2013-07-11: 10 [IU] via SUBCUTANEOUS
  Filled 2013-07-11 (×2): qty 0.1

## 2013-07-11 MED ORDER — HYDROMORPHONE HCL PF 1 MG/ML IJ SOLN
0.5000 mg | INTRAMUSCULAR | Status: DC | PRN
  Administered 2013-07-11 – 2013-07-12 (×7): 1 mg via INTRAVENOUS
  Filled 2013-07-11 (×7): qty 1

## 2013-07-11 NOTE — Progress Notes (Signed)
Physical Therapy Treatment Patient Details Name: Timothy [name] MRN: [id number] DOB: 22-Oct-1941 Today's Date: 07/11/2013 Time: 4098-1191 PT Time Calculation (min): 21 min  PT Assessment / Plan / Recommendation  History of Present Illness 72 year old male with history of extensive diverticulosis, s/p recent right inguinal hernia repair (06/29/13) with persistent lower GI bleed and hypotension.  Pt transfused >10 units of blood.  Underwent rt hemicolectomy on 07/06/13.   PT Comments   Pt making progress with mobility & PT goals at this date.  Pt states he feels very weak but he was able to increase ambulation distance.     Follow Up Recommendations  Home health PT;Supervision/Assistance - 24 hour     Does the patient have the potential to tolerate intense rehabilitation     Barriers to Discharge        Equipment Recommendations  Rolling walker with 5" wheels    Recommendations for Other Services    Frequency Min 3X/week   Progress towards PT Goals Progress towards PT goals: Progressing toward goals  Plan Current plan remains appropriate    Precautions / Restrictions Restrictions Weight Bearing Restrictions: No   Pertinent Vitals/Pain No pain reported.     Mobility  Bed Mobility Bed Mobility: Not assessed Transfers Transfers: Sit to Stand;Stand to Sit Sit to Stand: 3: Mod assist;With upper extremity assist;With armrests;From chair/3-in-1 Stand to Sit: 3: Mod assist;With upper extremity assist;With armrests;To chair/3-in-1 Details for Transfer Assistance: cues for hand placement.  (A) to achieve standing, anterior weight shift over BOS, & controlled descent.   Ambulation/Gait Ambulation/Gait Assistance: 4: Min guard (+2 to follow with recliner) Ambulation Distance (Feet): 100 Feet Assistive device: Rolling walker Ambulation/Gait Assistance Details: Cues for posture, look ahead, & for step-through pattern.   Gait Pattern: Step-through pattern;Decreased stride length;Trunk  flexed (decreased step height) Gait velocity: decr General Gait Details: Initially pt with step-to pattern but able to progress to step-through pattern towards end of distance.  Stairs: No Wheelchair Mobility Wheelchair Mobility: No      PT Goals (current goals can now be found in the care plan section) Acute Rehab PT Goals Patient Stated Goal: Return home PT Goal Formulation: With patient Time For Goal Achievement: 07/16/13 Potential to Achieve Goals: Good  Visit Information  Last PT Received On: 07/11/13 Assistance Needed: +1 History of Present Illness: 72 year old male with history of extensive diverticulosis, s/p recent right inguinal hernia repair (06/29/13) with persistent lower GI bleed and hypotension.  Pt transfused >10 units of blood.  Underwent rt hemicolectomy on 07/06/13.    Subjective Data  Patient Stated Goal: Return home   Cognition  Cognition Arousal/Alertness: Awake/alert Behavior During Therapy: WFL for tasks assessed/performed Overall Cognitive Status: Within Functional Limits for tasks assessed    Balance     End of Session PT - End of Session Equipment Utilized During Treatment: Gait belt Activity Tolerance: Patient tolerated treatment well Patient left: in chair;with call bell/phone within reach;with family/visitor present Nurse Communication: Mobility status   GP     Lara Mulch 07/11/2013, 1:39 PM  Verdell Face, PTA [phone] 07/11/2013

## 2013-07-11 NOTE — Progress Notes (Signed)
[facility] PROGRESS NOTE  Timothy [name] ZOX:[id number] DOB: [birth year] DOA: 07/01/2013 PCP: Timothy Mask, MD  Assessment/Plan: 1. GI Bleed; S/P dy 4  RT hemicolectomy started on clear liquids this a.m. by surgery. PCA discontinued and patient started on oral medication for pain.  Hemoglobin dropped to 6.8 yesterday (surgery goal>7.0) initiated 2 units PRBC transfusion with Lasix between units currently patient's hemoglobin 8.6 per surgeries no suspect hemoglobin was secondary to equilibrium the advanced patient's to full liquids 2. Hypotension; Resolved 3. Leukocytosis; resolved   4. DM; patient's CBGs continue to run high will add 10 units of Lantus daily and continue SS I to resistant, hemoglobin A1c= 6.1    Code Status: Full Family Communication: Wife present for discussion of Plan of Care) Disposition Plan: per Surgery   Consultants: GI, Surgery HPI/Subjective:   SIGNIFICANT EVENTS / STUDIES:  7/27 - Admission  7/27 - GI consulted: rec supportive care and transfusion (as needed).  7/29 - Surgery consulation: No interven states panic controlled todaytion recommended as exact source of bleeding unclear.  7/30 - Additional lower GI bleed and hypotension. PCCM consultation. 8 units of PRBCs transfused to date  7/30 - Visceral angiography: Negative celiac, SMA and IMA angiograms for active bleeding or acute vascular abnormality  7/30 - 4 units PRBCs  7/31 - 2 units PRBCs  7/31 Endoscopy: Bleeding appeared to be from R side of colon  7/31 Tagged RBC study: positive bleeding @ hepatic flexure  7/31 OR for R hemicolectomy  8/1 Self extubation - tolerating TODAY States Pain under control. Sitting in chair comfortably, diet has been advanced to clear liquids patient has been ambulating up and down the hall without any adverse effects . States had a watery red bowel movement today negative abdominal pain negative back pain .       Objective: Filed Vitals:   07/10/13  2230 07/10/13 2330 07/11/13 0650 07/11/13 1400  BP: 118/51 123/59 107/87 150/68  Pulse: 81 79 64 90  Temp: 98.9 F (37.2 C) 98.9 F (37.2 C) 98.7 F (37.1 C) 98.1 F (36.7 C)  TempSrc: Oral Oral Oral Oral  Resp:   16 18  Height:      Weight:   110.6 kg (243 lb 13.3 oz)   SpO2:   94% 97%    Intake/Output Summary (Last 24 hours) at 07/11/13 1425 Last data filed at 07/11/13 1300  Gross per 24 hour  Intake 1487.5 ml  Output   2675 ml  Net -1187.5 ml   Filed Weights   07/09/13 0429 07/10/13 0516 07/11/13 0650  Weight: 111.5 kg (245 lb 13 oz) 110.4 kg (243 lb 6.2 oz) 110.6 kg (243 lb 13.3 oz)    Exam:   General: Alert,NAD(PCA pump present)   Cardiovascular:rhythm and rate, negative murmurs rubs or gallops, DP/PT pulse 2+    Respiratory:   Abdomen: clear to auscultation bilateral    Musculoskeletal: Abdomen has a binder on, no sign of bleeding, appropriately tender to palpation**   Data Reviewed: Basic Metabolic Panel:  Recent Labs Lab 07/07/13 0434 07/08/13 0530 07/09/13 0500 07/10/13 1015 07/11/13 0500  NA 143 142 143 143 142  K 4.0 3.5 3.6 3.4* 3.7  CL 115* 112 113* 111 107  CO2 15* 21 23 24 26   GLUCOSE 271* 212* 206* 190* 188*  BUN 18 12 12 12 11   CREATININE 1.04 0.88 0.90 0.85 0.92  CALCIUM 6.4* 7.1* 7.5* 7.5* 7.8*   Liver Function Tests:  Recent Labs Lab 07/09/13 0500 07/10/13  1015 07/11/13 0500  AST 16 19 21   ALT 10 12 13   ALKPHOS 39 39 48  BILITOT 0.7 0.6 1.0  PROT 4.1* 4.1* 4.4*  ALBUMIN 1.8* 1.8* 1.9*   No results found for this basename: LIPASE, AMYLASE,  in the last 168 hours No results found for this basename: AMMONIA,  in the last 168 hours CBC:  Recent Labs Lab 07/07/13 0434 07/08/13 0530 07/09/13 0500 07/10/13 1015 07/11/13 0500  WBC 12.3* 9.8 9.6 7.7 8.2  NEUTROABS  --   --   --  6.2 6.5  HGB 10.3* 8.0* 7.3* 6.7* 8.6*  HCT 29.4* 23.2* 21.5* 20.0* 25.9*  MCV 87.0 87.5 90.0 90.5 88.7  PLT 96* 118* 146* 179 211    Cardiac Enzymes: No results found for this basename: CKTOTAL, CKMB, CKMBINDEX, TROPONINI,  in the last 168 hours BNP (last 3 results) No results found for this basename: PROBNP,  in the last 8760 hours CBG:  Recent Labs Lab 07/10/13 1558 07/10/13 2030 07/10/13 2340 07/11/13 0355 07/11/13 1117  GLUCAP 194* 175* 164* 173* 263*    Recent Results (from the past 240 hour(s))  MRSA PCR SCREENING     Status: None   Collection Time    07/02/13  3:01 AM      Result Value Range Status   MRSA by PCR NEGATIVE  NEGATIVE Final   Comment:            The GeneXpert MRSA Assay (FDA     approved for NASAL specimens     only), is one component of a     comprehensive MRSA colonization     surveillance program. It is not     intended to diagnose MRSA     infection nor to guide or     monitor treatment for     MRSA infections.  CULTURE, BLOOD (ROUTINE X 2)     Status: None   Collection Time    07/02/13 12:40 PM      Result Value Range Status   Specimen Description BLOOD RIGHT ARM   Final   Special Requests BOTTLES DRAWN AEROBIC AND ANAEROBIC 10CC   Final   Culture  Setup Time 07/02/2013 20:30   Final   Culture NO GROWTH 5 DAYS   Final   Report Status 07/08/2013 FINAL   Final  CULTURE, BLOOD (ROUTINE X 2)     Status: None   Collection Time    07/02/13 12:45 PM      Result Value Range Status   Specimen Description BLOOD RIGHT HAND   Final   Special Requests BOTTLES DRAWN AEROBIC ONLY 10CC   Final   Culture  Setup Time 07/02/2013 20:35   Final   Culture NO GROWTH 5 DAYS   Final   Report Status 07/08/2013 FINAL   Final     Studies: No results found.  Scheduled Meds: . antiseptic oral rinse  15 mL Mouth Rinse q12n4p  . chlorhexidine  15 mL Mouth Rinse BID  . insulin aspart  0-20 Units Subcutaneous Q4H  . pantoprazole  40 mg Oral Daily  . sodium chloride  3 mL Intravenous Q12H   Continuous Infusions: . dextrose 5 % and 0.45 % NaCl with KCl 20 mEq/L 50 mL/hr at 07/11/13 1033     Principal Problem:   Hemorrhagic shock Active Problems:   CAD, NATIVE VESSEL   Diabetes   Acute lower GI bleeding   Abdominal pain   Acute blood loss anemia   Respiratory  failure, post-operative    Time spent: 40 min    Timothy [name], J  [facility] Pager [phone]. If 7PM-7AM, please contact night-coverage at [email or web address], password [facility] 07/11/2013, 2:25 PM  LOS: 10 days

## 2013-07-11 NOTE — Progress Notes (Signed)
Agree with above, two units prbcs yesterday with appropriate response. Ambulate in halls today.

## 2013-07-11 NOTE — Progress Notes (Signed)
Patient ID: Timothy [name], male   DOB: 1941-11-28, 72 y.o.   MRN: [id number] 5 Days Post-Op  Subjective: Pt feeling well this morning.  Having a lot of flatus.  Objective: Vital signs in last 24 hours: Temp:  [98 F (36.7 C)-98.9 F (37.2 C)] 98.7 F (37.1 C) (08/05 0650) Pulse Rate:  [64-90] 64 (08/05 0650) Resp:  [16-18] 16 (08/05 0650) BP: (107-128)/(48-87) 107/87 mmHg (08/05 0650) SpO2:  [91 %-100 %] 94 % (08/05 0650) Weight:  [243 lb 13.3 oz (110.6 kg)] 243 lb 13.3 oz (110.6 kg) (08/05 0650) Last BM Date: 07/05/13  Intake/Output from previous day: 08/04 0701 - 08/05 0700 In: 1607.5 [P.O.:840; I.V.:467.5; Blood:300] Out: 2675 [Urine:2675] Intake/Output this shift:    PE: Abd: soft, appropriately tender, +BS, ND, incision c/d/i with staples.  No evidence of infection  Lab Results:   Recent Labs  07/10/13 1015 07/11/13 0500  WBC 7.7 8.2  HGB 6.7* 8.6*  HCT 20.0* 25.9*  PLT 179 211   BMET  Recent Labs  07/10/13 1015 07/11/13 0500  NA 143 142  K 3.4* 3.7  CL 111 107  CO2 24 26  GLUCOSE 190* 188*  BUN 12 11  CREATININE 0.85 0.92  CALCIUM 7.5* 7.8*   PT/INR No results found for this basename: LABPROT, INR,  in the last 72 hours CMP     Component Value Date/Time   NA 142 07/11/2013 0500   K 3.7 07/11/2013 0500   CL 107 07/11/2013 0500   CO2 26 07/11/2013 0500   GLUCOSE 188* 07/11/2013 0500   BUN 11 07/11/2013 0500   CREATININE 0.92 07/11/2013 0500   CALCIUM 7.8* 07/11/2013 0500   PROT 4.4* 07/11/2013 0500   ALBUMIN 1.9* 07/11/2013 0500   AST 21 07/11/2013 0500   ALT 13 07/11/2013 0500   ALKPHOS 48 07/11/2013 0500   BILITOT 1.0 07/11/2013 0500   GFRNONAA 81* 07/11/2013 0500   GFRAA >90 07/11/2013 0500   Lipase  No results found for this basename: lipase       Studies/Results: No results found.  Anti-infectives: Anti-infectives   Start     Dose/Rate Route Frequency Ordered Stop   07/06/13 2015  cefOXitin (MEFOXIN) 2 g in dextrose 5 % 50 mL IVPB     2 g 100 mL/hr  over 30 Minutes Intravenous  Once 07/06/13 2006 07/06/13 2113   07/02/13 0600  ciprofloxacin (CIPRO) IVPB 400 mg  Status:  Discontinued     400 mg 200 mL/hr over 60 Minutes Intravenous Every 12 hours 07/02/13 0137 07/03/13 1343   07/02/13 0145  metroNIDAZOLE (FLAGYL) IVPB 500 mg  Status:  Discontinued     500 mg 100 mL/hr over 60 Minutes Intravenous Every 8 hours 07/02/13 0137 07/03/13 1343       Assessment/Plan  1. S/p right colectomy for LGI bleed 2. Post op ileus, resolving  Plan: 1. No further bleeding.  Suspect hgb drop was still equilibration. 2. Will advance to full liquids today and transition to oral pain meds. 3. Cont to mobilize and pulm toilet    LOS: 10 days    Ronnica Dreese E 07/11/2013, 8:05 AM Pager: [phone]

## 2013-07-12 LAB — CBC WITH DIFFERENTIAL/PLATELET
Eosinophils Absolute: 0.4 10*3/uL (ref 0.0–0.7)
Hemoglobin: 9.3 g/dL — ABNORMAL LOW (ref 13.0–17.0)
Lymphs Abs: 0.6 10*3/uL — ABNORMAL LOW (ref 0.7–4.0)
MCH: 29.9 pg (ref 26.0–34.0)
MCV: 87.1 fL (ref 78.0–100.0)
Monocytes Relative: 9 % (ref 3–12)
Neutrophils Relative %: 79 % — ABNORMAL HIGH (ref 43–77)
RBC: 3.11 MIL/uL — ABNORMAL LOW (ref 4.22–5.81)

## 2013-07-12 LAB — COMPREHENSIVE METABOLIC PANEL
Alkaline Phosphatase: 53 U/L (ref 39–117)
BUN: 11 mg/dL (ref 6–23)
GFR calc Af Amer: >90 mL/min (ref >90)
GFR calc non Af Amer: 81 mL/min — ABNORMAL LOW (ref >90)
Glucose, Bld: 284 mg/dL — ABNORMAL HIGH (ref 70–99)
Potassium: 3.7 mEq/L (ref 3.5–5.1)
Total Bilirubin: 1.2 mg/dL (ref 0.3–1.2)
Total Protein: 4.6 g/dL — ABNORMAL LOW (ref 6.0–8.3)

## 2013-07-12 LAB — GLUCOSE, CAPILLARY
Glucose-Capillary: 196 mg/dL — ABNORMAL HIGH (ref 70–99)
Glucose-Capillary: 214 mg/dL — ABNORMAL HIGH (ref 70–99)
Glucose-Capillary: 203 mg/dL — ABNORMAL HIGH (ref 70–99)

## 2013-07-12 MED ORDER — FAMOTIDINE 20 MG PO TABS
20.0000 mg | ORAL_TABLET | Freq: Two times a day (BID) | ORAL | Status: DC | PRN
  Administered 2013-07-12: 20 mg via ORAL
  Filled 2013-07-12 (×2): qty 1

## 2013-07-12 MED ORDER — LINAGLIPTIN 5 MG PO TABS
5.0000 mg | ORAL_TABLET | Freq: Every day | ORAL | Status: DC
  Administered 2013-07-12 – 2013-07-13 (×2): 5 mg via ORAL
  Filled 2013-07-12 (×2): qty 1

## 2013-07-12 MED ORDER — INSULIN ASPART 100 UNIT/ML ~~LOC~~ SOLN
0.0000 [IU] | Freq: Every day | SUBCUTANEOUS | Status: DC
  Administered 2013-07-12: 2 [IU] via SUBCUTANEOUS

## 2013-07-12 MED ORDER — INSULIN ASPART 100 UNIT/ML ~~LOC~~ SOLN
0.0000 [IU] | Freq: Three times a day (TID) | SUBCUTANEOUS | Status: DC
  Administered 2013-07-12: 3 [IU] via SUBCUTANEOUS
  Administered 2013-07-12: 5 [IU] via SUBCUTANEOUS
  Administered 2013-07-13 (×2): 3 [IU] via SUBCUTANEOUS
  Administered 2013-07-13: 2 [IU] via SUBCUTANEOUS

## 2013-07-12 MED ORDER — GLIMEPIRIDE 4 MG PO TABS
8.0000 mg | ORAL_TABLET | Freq: Every day | ORAL | Status: DC
  Administered 2013-07-13: 8 mg via ORAL
  Filled 2013-07-12 (×2): qty 2

## 2013-07-12 NOTE — Progress Notes (Signed)
[facility] PROGRESS NOTE  Timothy [name]:[id number] DOB: 03/05/1941 DOA: 07/01/2013 PCP: [name], MD  Assessment/Plan: 1. GI Bleed; S/P dy 4  RT hemicolectomy; PCA discontinued and patient started on oral medication for pain.  2 units PRBC transfusion with Lasix between; diet advanced 2. Hypotension; Resolved 3. Leukocytosis; resolved   4. DM; resume home meds minus glucophage, hemoglobin A1c= 6.1    Code Status: Full Family Communication: Wife present for discussion of Plan of Care) Disposition Plan: d/c home in AM   Consultants: GI, Surgery  HPI/Subjective: Doing well- eating well, no pain +gas   SIGNIFICANT EVENTS / STUDIES:  7/27 - Admission  7/27 - GI consulted: rec supportive care and transfusion (as needed).  7/29 - Surgery consulation: No interven states panic controlled todaytion recommended as exact source of bleeding unclear.  7/30 - Additional lower GI bleed and hypotension. PCCM consultation. 8 units of PRBCs transfused to date  7/30 - Visceral angiography: Negative celiac, SMA and IMA angiograms for active bleeding or acute vascular abnormality  7/30 - 4 units PRBCs  7/31 - 2 units PRBCs  7/31 Endoscopy: Bleeding appeared to be from R side of colon  7/31 Tagged RBC study: positive bleeding @ hepatic flexure  7/31 OR for R hemicolectomy  8/1 Self extubation - tolerating      Objective: Filed Vitals:   07/11/13 0650 07/11/13 1400 07/11/13 2058 07/12/13 0418  BP: 107/87 150/68 134/65 116/54  Pulse: 64 90 84 76  Temp: 98.7 F (37.1 C) 98.1 F (36.7 C) 98.1 F (36.7 C) 98.6 F (37 C)  TempSrc: Oral Oral Oral Oral  Resp: 16 18 18 16   Height:      Weight: 110.6 kg (243 lb 13.3 oz)   111.6 kg (246 lb 0.5 oz)  SpO2: 94% 97% 98% 93%    Intake/Output Summary (Last 24 hours) at 07/12/13 1030 Last data filed at 07/12/13 0900  Gross per 24 hour  Intake 1922.5 ml  Output   1100 ml  Net  822.5 ml   Filed Weights   07/10/13 0516  07/11/13 0650 07/12/13 0418  Weight: 110.4 kg (243 lb 6.2 oz) 110.6 kg (243 lb 13.3 oz) 111.6 kg (246 lb 0.5 oz)    Exam:   General: Alert,NAD(PCA pump present)   Cardiovascular:rhythm and rate, negative murmurs rubs or gallops, DP/PT pulse 2+    Respiratory:   Abdomen: clear to auscultation bilateral    Musculoskeletal: Abdomen has a binder on, no sign of bleeding, appropriately tender to palpation  Data Reviewed: Basic Metabolic Panel:  Recent Labs Lab 07/07/13 0434 07/08/13 0530 07/09/13 0500 07/10/13 1015 07/11/13 0500  NA 143 142 143 143 142  K 4.0 3.5 3.6 3.4* 3.7  CL 115* 112 113* 111 107  CO2 15* 21 23 24 26   GLUCOSE 271* 212* 206* 190* 188*  BUN 18 12 12 12 11   CREATININE 1.04 0.88 0.90 0.85 0.92  CALCIUM 6.4* 7.1* 7.5* 7.5* 7.8*   Liver Function Tests:  Recent Labs Lab 07/09/13 0500 07/10/13 1015 07/11/13 0500  AST 16 19 21   ALT 10 12 13   ALKPHOS 39 39 48  BILITOT 0.7 0.6 1.0  PROT 4.1* 4.1* 4.4*  ALBUMIN 1.8* 1.8* 1.9*   No results found for this basename: LIPASE, AMYLASE,  in the last 168 hours No results found for this basename: AMMONIA,  in the last 168 hours CBC:  Recent Labs Lab 07/08/13 0530 07/09/13 0500 07/10/13 1015 07/11/13 0500 07/12/13 0959  WBC  9.8 9.6 7.7 8.2 8.5  NEUTROABS  --   --  6.2 6.5 6.7  HGB 8.0* 7.3* 6.7* 8.6* 9.3*  HCT 23.2* 21.5* 20.0* 25.9* 27.1*  MCV 87.5 90.0 90.5 88.7 87.1  PLT 118* 146* 179 211 209   Cardiac Enzymes: No results found for this basename: CKTOTAL, CKMB, CKMBINDEX, TROPONINI,  in the last 168 hours BNP (last 3 results) No results found for this basename: PROBNP,  in the last 8760 hours CBG:  Recent Labs Lab 07/11/13 1646 07/11/13 2032 07/12/13 0040 07/12/13 0405 07/12/13 0647  GLUCAP 203* 199* 188* 178* 167*    Recent Results (from the past 240 hour(s))  CULTURE, BLOOD (ROUTINE X 2)     Status: None   Collection Time    07/02/13 12:40 PM      Result Value Range Status    Specimen Description BLOOD RIGHT ARM   Final   Special Requests BOTTLES DRAWN AEROBIC AND ANAEROBIC 10CC   Final   Culture  Setup Time 07/02/2013 20:30   Final   Culture NO GROWTH 5 DAYS   Final   Report Status 07/08/2013 FINAL   Final  CULTURE, BLOOD (ROUTINE X 2)     Status: None   Collection Time    07/02/13 12:45 PM      Result Value Range Status   Specimen Description BLOOD RIGHT HAND   Final   Special Requests BOTTLES DRAWN AEROBIC ONLY 10CC   Final   Culture  Setup Time 07/02/2013 20:35   Final   Culture NO GROWTH 5 DAYS   Final   Report Status 07/08/2013 FINAL   Final     Studies: No results found.  Scheduled Meds: . antiseptic oral rinse  15 mL Mouth Rinse q12n4p  . chlorhexidine  15 mL Mouth Rinse BID  . insulin aspart  0-15 Units Subcutaneous TID WC  . insulin aspart  0-5 Units Subcutaneous QHS  . insulin glargine  10 Units Subcutaneous QHS  . pantoprazole  40 mg Oral Daily  . sodium chloride  3 mL Intravenous Q12H   Continuous Infusions: . dextrose 5 % and 0.45 % NaCl with KCl 20 mEq/L 50 mL/hr at 07/12/13 0703    Principal Problem:   Hemorrhagic shock Active Problems:   CAD, NATIVE VESSEL   Diabetes   Acute lower GI bleeding   Abdominal pain   Acute blood loss anemia   Respiratory failure, post-operative    Time spent: 35 min    Licia Harl  [facility] Pager [phone]. If 7PM-7AM, please contact night-coverage at [email or web address], password [facility] 07/12/2013, 10:30 AM  LOS: 11 days

## 2013-07-12 NOTE — Progress Notes (Signed)
Patient ID: Timothy [name], male   DOB: July 05, 1941, 72 y.o.   MRN: [id number] 6 Days Post-Op  Subjective: Pt feels well.  Pain is controlled.  Tolerating full liquids, will give carb mod today.  + flatus, no BM yet (not surprising)  Objective: Vital signs in last 24 hours: Temp:  [98.1 F (36.7 C)-98.6 F (37 C)] 98.6 F (37 C) (08/06 0418) Pulse Rate:  [76-90] 76 (08/06 0418) Resp:  [16-18] 16 (08/06 0418) BP: (116-150)/(54-68) 116/54 mmHg (08/06 0418) SpO2:  [93 %-98 %] 93 % (08/06 0418) Weight:  [246 lb 0.5 oz (111.6 kg)] 246 lb 0.5 oz (111.6 kg) (08/06 0418) Last BM Date: 07/11/13  Intake/Output from previous day: 08/05 0701 - 08/06 0700 In: 720 [P.O.:720] Out: 800 [Urine:800] Intake/Output this shift: Total I/O In: 1202.5 [I.V.:1202.5] Out: -   PE: Abd: soft, minimally tender, incision c/d/i with staples, no erythema, +BS, ND, still with some scrotal swelling as expected.  RLQ hernia incision is also c/d/i  Lab Results:   Recent Labs  07/10/13 1015 07/11/13 0500  WBC 7.7 8.2  HGB 6.7* 8.6*  HCT 20.0* 25.9*  PLT 179 211   BMET  Recent Labs  07/10/13 1015 07/11/13 0500  NA 143 142  K 3.4* 3.7  CL 111 107  CO2 24 26  GLUCOSE 190* 188*  BUN 12 11  CREATININE 0.85 0.92  CALCIUM 7.5* 7.8*   PT/INR No results found for this basename: LABPROT, INR,  in the last 72 hours CMP     Component Value Date/Time   NA 142 07/11/2013 0500   K 3.7 07/11/2013 0500   CL 107 07/11/2013 0500   CO2 26 07/11/2013 0500   GLUCOSE 188* 07/11/2013 0500   BUN 11 07/11/2013 0500   CREATININE 0.92 07/11/2013 0500   CALCIUM 7.8* 07/11/2013 0500   PROT 4.4* 07/11/2013 0500   ALBUMIN 1.9* 07/11/2013 0500   AST 21 07/11/2013 0500   ALT 13 07/11/2013 0500   ALKPHOS 48 07/11/2013 0500   BILITOT 1.0 07/11/2013 0500   GFRNONAA 81* 07/11/2013 0500   GFRAA >90 07/11/2013 0500   Lipase  No results found for this basename: lipase       Studies/Results: No results  found.  Anti-infectives: Anti-infectives   Start     Dose/Rate Route Frequency Ordered Stop   07/06/13 2015  cefOXitin (MEFOXIN) 2 g in dextrose 5 % 50 mL IVPB     2 g 100 mL/hr over 30 Minutes Intravenous  Once 07/06/13 2006 07/06/13 2113   07/02/13 0600  ciprofloxacin (CIPRO) IVPB 400 mg  Status:  Discontinued     400 mg 200 mL/hr over 60 Minutes Intravenous Every 12 hours 07/02/13 0137 07/03/13 1343   07/02/13 0145  metroNIDAZOLE (FLAGYL) IVPB 500 mg  Status:  Discontinued     500 mg 100 mL/hr over 60 Minutes Intravenous Every 8 hours 07/02/13 0137 07/03/13 1343       Assessment/Plan  1. LGI bleed, s/p right colectomy 2. S/p right inguinal hernia repair 3. ABL anemia, improved s/p transfusion  Plan: 1. Advance to a carb mod diet today and oral pain meds.  If he tolerates this, he may be dc home from our standpoint whenever he is medically ok for dc. 2. He will need to see [place] next week for staples out and then follow up with either Dr. Gerrit Friends or [name] in 3 weeks.  LOS: 11 days    Chelan Heringer E 07/12/2013, 8:28 AM Pager: [phone]

## 2013-07-12 NOTE — Progress Notes (Signed)
The patient had one small, liquid, burgundy-colored stool overnight.  He did have complaints of abdominal pain, but it was effectively controlled with Dilaudid and he slept a large part of the night.

## 2013-07-12 NOTE — Progress Notes (Signed)
Physical Therapy Treatment Patient Details Name: Timothy [name] MRN: [id number] DOB: [birth year] Today's Date: 07/12/2013 Time: 7829-5621 PT Time Calculation (min): 24 min  PT Assessment / Plan / Recommendation  History of Present Illness 72 year old male with history of extensive diverticulosis, s/p recent right inguinal hernia repair (06/29/13) with persistent lower GI bleed and hypotension.  Pt transfused >10 units of blood.  Underwent rt hemicolectomy on 07/06/13.   PT Comments   Pt cont's to progress with mobility.  He reports he feels better overall but still feels generally weak due to decreased activity over hospital stay.     Follow Up Recommendations  Home health PT;Supervision/Assistance - 24 hour     Does the patient have the potential to tolerate intense rehabilitation     Barriers to Discharge        Equipment Recommendations  Rolling walker with 5" wheels    Recommendations for Other Services    Frequency Min 3X/week   Progress towards PT Goals Progress towards PT goals: Progressing toward goals  Plan Current plan remains appropriate    Precautions / Restrictions Restrictions Weight Bearing Restrictions: No   Pertinent Vitals/Pain C/o mild discomfort at abdominal incision    Mobility  Bed Mobility Bed Mobility: Not assessed Transfers Transfers: Sit to Stand;Stand to Sit Sit to Stand: 4: Min assist;4: Min guard;With upper extremity assist;With armrests;From chair/3-in-1 Stand to Sit: 4: Min guard;With upper extremity assist;With armrests;To chair/3-in-1 Details for Transfer Assistance: Initially required min (A) to achieve standing but progressed to min guard.  Performed sit<>stand x 6 trials for strengthening & activity tolerance.   Ambulation/Gait Ambulation/Gait Assistance: 4: Min guard Ambulation Distance (Feet): 220 Feet Assistive device: Rolling walker Ambulation/Gait Assistance Details: cues for tall posture & encouragement to decrease reliance of UE's  on RW.   Pt states hes still trying to get comfortable with using RW & gain more confidence in ability to ambulate.  Pt steady.   Gait Pattern: Step-through pattern;Trunk flexed Stairs: Yes Stairs Assistance: 4: Min assist Stairs Assistance Details (indicate cue type and reason): cues for sequencing & technique.  (A) for support/stability & to bring trunk over weak LE's when stepping up.   Stair Management Technique: One rail Left;Forwards;Step to pattern Number of Stairs: 2 Wheelchair Mobility Wheelchair Mobility: No      PT Goals (current goals can now be found in the care plan section) Acute Rehab PT Goals Patient Stated Goal: Return home PT Goal Formulation: With patient Time For Goal Achievement: 07/16/13 Potential to Achieve Goals: Good  Visit Information  Last PT Received On: 07/12/13 Assistance Needed: +1 History of Present Illness: 72 year old male with history of extensive diverticulosis, s/p recent right inguinal hernia repair (06/29/13) with persistent lower GI bleed and hypotension.  Pt transfused >10 units of blood.  Underwent rt hemicolectomy on 07/06/13.    Subjective Data  Patient Stated Goal: Return home   Cognition  Cognition Arousal/Alertness: Awake/alert Behavior During Therapy: WFL for tasks assessed/performed Overall Cognitive Status: Within Functional Limits for tasks assessed    Balance     End of Session PT - End of Session Equipment Utilized During Treatment: Gait belt Activity Tolerance: Patient tolerated treatment well Patient left: in chair;with call bell/phone within reach;with family/visitor present Nurse Communication: Mobility status   GP     Lara Mulch 07/12/2013, 1:27 PM  Verdell Face, PTA [phone] 07/12/2013

## 2013-07-12 NOTE — Progress Notes (Signed)
Agree with above 

## 2013-07-13 LAB — BASIC METABOLIC PANEL
BUN: 11 mg/dL (ref 6–23)
CO2: 30 mEq/L (ref 19–32)
Calcium: 8.1 mg/dL — ABNORMAL LOW (ref 8.4–10.5)
Creatinine, Ser: 1.02 mg/dL (ref 0.50–1.35)
GFR calc non Af Amer: 70 mL/min — ABNORMAL LOW (ref >90)
Glucose, Bld: 118 mg/dL — ABNORMAL HIGH (ref 70–99)
Sodium: 139 mEq/L (ref 135–145)

## 2013-07-13 LAB — CBC WITH DIFFERENTIAL/PLATELET
Basophils Absolute: 0 10*3/uL (ref 0.0–0.1)
Basophils Relative: 0 % (ref 0–1)
Eosinophils Absolute: 0.3 10*3/uL (ref 0.0–0.7)
HCT: 23.8 % — ABNORMAL LOW (ref 39.0–52.0)
Hemoglobin: 8.2 g/dL — ABNORMAL LOW (ref 13.0–17.0)
MCH: 29.9 pg (ref 26.0–34.0)
MCHC: 34.5 g/dL (ref 30.0–36.0)
Monocytes Absolute: 0.6 10*3/uL (ref 0.1–1.0)
Monocytes Relative: 10 % (ref 3–12)
Neutro Abs: 4 10*3/uL (ref 1.7–7.7)
Neutrophils Relative %: 73 % (ref 43–77)
RDW: 15.2 % (ref 11.5–15.5)

## 2013-07-13 LAB — GLUCOSE, CAPILLARY

## 2013-07-13 LAB — COMPREHENSIVE METABOLIC PANEL
AST: 14 U/L (ref 0–37)
Albumin: 1.7 g/dL — ABNORMAL LOW (ref 3.5–5.2)
BUN: 10 mg/dL (ref 6–23)
Creatinine, Ser: 0.94 mg/dL (ref 0.50–1.35)
Total Protein: 4 g/dL — ABNORMAL LOW (ref 6.0–8.3)

## 2013-07-13 MED ORDER — POTASSIUM CHLORIDE CRYS ER 20 MEQ PO TBCR
40.0000 meq | EXTENDED_RELEASE_TABLET | ORAL | Status: AC
  Administered 2013-07-13 (×5): 40 meq via ORAL
  Filled 2013-07-13 (×2): qty 2

## 2013-07-13 MED ORDER — PANTOPRAZOLE SODIUM 40 MG PO TBEC: 40.0000 mg | DELAYED_RELEASE_TABLET | Freq: Every day | ORAL | Status: DC

## 2013-07-13 MED ORDER — OXYCODONE-ACETAMINOPHEN 5-325 MG PO TABS: 1.0000 | ORAL_TABLET | ORAL | Status: DC | PRN

## 2013-07-13 MED ORDER — FUROSEMIDE 40 MG PO TABS
40.0000 mg | ORAL_TABLET | Freq: Once | ORAL | Status: AC
  Administered 2013-07-13: 40 mg via ORAL
  Filled 2013-07-13: qty 1

## 2013-07-13 NOTE — Discharge Summary (Signed)
Physician Discharge Summary  Timothy [name] YNW:[id number] DOB: 04-May-1941 DOA: 07/01/2013  PCP: [name], MD  Admit date: 07/01/2013 Discharge date: 07/13/2013  Time spent: 35 minutes  Recommendations for Outpatient Follow-up:  1. Home health PT 2. Monitor BMP 3. CBC 1 week re anemia 4. Hold plavix until seen by PCP/surgery  Discharge Diagnoses:  Principal Problem:   Hemorrhagic shock Active Problems:   CAD, NATIVE VESSEL   Diabetes   Acute lower GI bleeding   Abdominal pain   Acute blood loss anemia   Respiratory failure, post-operative   Discharge Condition: improved  Diet recommendation: cardiac/diabetic  Filed Weights   07/11/13 0650 07/12/13 0418 07/13/13 0551  Weight: 110.6 kg (243 lb 13.3 oz) 111.6 kg (246 lb 0.5 oz) 111.7 kg (246 lb 4.1 oz)    History of present illness:  72 yr. Old WM w/ pmhx significant for CAD s/p DES and BMS in 2003 (NSTEMI in 2001, 2002), Type 2 DM, HTN, HL, presents with BRBPR. He states today he has had two bloody BM's and began to have associated dizziness. He states he has diffuse abdominal pain, but more in the LLQ. He denies CP or SOB.  In the ED, he was noted to have two additional bloody BM's. Once of these I witnessed myself. His lowest SBP has been 89 and he is currently receiving his first unit of PRBCs. His Hgb is 10, decreased from a previous value of 13.8 on 7/24.  He is s/p right inguinal hernia repair on 06/29/13. He is on Plavix, but not ASA. He denies any further NSAID use.  I discussed the case with Dr. Matthias Hughs of [facility]. He had an admission on 01/2010 with similar presentation, at that time determined to be a diverticular bleed. He has a colonoscopy which was performed on 07/2009 which revealed extensive diverticulosis (sigmoid and descending).   Hospital Course:  GI Bleed; S/P RT hemicolectomy; PCA discontinued and patient started on oral medication for pain. 2 units PRBC transfusion with Lasix between; diet  advanced -hold plavix until seen by PCP and surgery clinic   Hypotension; Resolved  Leukocytosis; resolved   DM; resume home meds, hemoglobin A1c= 6.1  Hypokalemia- replete  Procedures:  hemicolectomy  Consultations:  surgery  Discharge Exam: Filed Vitals:   07/13/13 0928  BP: 116/57  Pulse: 79  Temp:   Resp:     General: A+Ox3, NAD Cardiovascular: rrr Respiratory: clear anterior  Discharge Instructions   Future Appointments Provider Department Dept Phone   07/17/2013 10:30 AM [facility] Nurse [facility], [place] [phone]   07/26/2013 9:30 AM [name], MD [facility], [place] [phone]       Medication List    ASK your doctor about these medications       acetaminophen 500 MG tablet  Commonly known as:  TYLENOL  Take 1,000 mg by mouth every 6 (six) hours as needed for pain.     clopidogrel 75 MG tablet  Commonly known as:  PLAVIX  Take 75 mg by mouth daily.     docusate sodium 100 MG capsule  Commonly known as:  COLACE  Take 200 mg by mouth daily as needed for constipation.     glimepiride 4 MG tablet  Commonly known as:  AMARYL  Take 8 mg by mouth daily before breakfast.     loratadine 10 MG tablet  Commonly known as:  CLARITIN  Take 10 mg by mouth daily.     metFORMIN 1000  MG tablet  Commonly known as:  GLUCOPHAGE  Take 1,000-1,500 mg by mouth See admin instructions. Takes 1 tablet with breakfast and 1.5 tablets with supper.     metoprolol tartrate 25 MG tablet  Commonly known as:  LOPRESSOR  Take 25 mg by mouth 2 (two) times daily.     pravastatin 40 MG tablet  Commonly known as:  PRAVACHOL  Take 40 mg by mouth every evening.     ramipril 10 MG capsule  Commonly known as:  ALTACE  Take 10 mg by mouth daily.     sitaGLIPtin 100 MG tablet  Commonly known as:  JANUVIA  Take 100 mg by mouth daily.     traMADol 50 MG tablet  Commonly known as:  ULTRAM  Take 50 mg by mouth every 6 (six) hours as needed  for pain.       No Known Allergies     Follow-up Information   Follow up with [facility] On 07/17/2013. (10:30am, arrive by 10:15am for staple removal)    Contact information:   [address] [place] [address] [phone]      Follow up with [name], MD.   Contact information:   [address] [place] [address] [phone]        The results of significant diagnostics from this hospitalization (including imaging, microbiology, ancillary and laboratory) are listed below for reference.    Significant Diagnostic Studies: Nm Gi Blood Loss  07/06/2013   *RADIOLOGY REPORT*  Clinical Data: Acute lower GI bleed with colonoscopy today demonstrating blood and clots in the colon, more prominently at the level of the hepatic flexure and ascending colon.  NUCLEAR MEDICINE GASTROINTESTINAL BLEEDING STUDY  Technique:  Sequential abdominal images were obtained following intravenous administration of Tc-20m labeled red blood cells.  Radiopharmaceutical: CURIE ULTRATAG TECHNETIUM TC 54M- LABELED RED BLOOD CELLS IV KIT  Comparison: CT angiography of the abdomen pelvis and mesenteric arteriography on 07/05/2013.  Findings: After administration of tag red blood cells, there is immediate positive bleeding identified at the level of the hepatic flexure of the colon.  Imaging was performed over 1 hour showing transit of positive colonic activity initially into the ascending colon and ultimately across the transverse colon and splenic flexure.  By the end of 1 hour of imaging, blood had reached the lower descending colon.  IMPRESSION: The bleeding scan is positive for acute bleeding at the level of the hepatic flexure of the colon.  Findings were communicated to Dr. Janee Morn during the examination.   Original Report Authenticated By: Irish Lack, M.D.   Ir Angiogram Visceral Selective  07/05/2013   *RADIOLOGY REPORT*  Clinical Data: Acute  GI bleeding, tachycardia, hypotension  ULTRASOUND GUIDANCE VASCULAR ACCESS CELIAC, SMA, AND IMA CATHETERIZATIONS AND ANGIOGRAMS  Date:  07/05/2013 18:00:00  Radiologist:  M. Ruel Favors, M.D.  Medications:  1% lidocaine locally , 4 mg Versed 100 mcg Fentanyl for conscious sedation  Guidance:  Ultrasound fluoroscopic  Fluoroscopy time:  7.9 minutes  Sedation time:  40 minutes  Contrast volume:  180 ml Omnipaque-300  Complications:  No immediate  PROCEDURE/FINDINGS:  Informed consent was obtained from the patient following explanation of the procedure, risks, benefits and alternatives. The patient understands, agrees and consents for the procedure. All questions were addressed.  A time out was performed.  Maximal barrier sterile technique utilized including caps, mask, sterile gowns, sterile gloves, large sterile drape, hand hygiene, and Chloroprep  Under sterile  conditions and local anesthesia, ultrasound micropuncture access was performed the right common femoral artery. Images obtained for documentation.  5-French sheath inserted over a guide wire.  C2 catheter utilized to select the SMA origin over a Bentson guide wire.  Selective SMA angiograms performed.  SMA:  SMA is widely patent.  Main ileocolic trunk is patent and mesentery.  Jejunal and colic branches are visualized.  No evidence of active contrast extravasation, active bleeding, or vascular abnormality.  No early draining vein, AVM, or angiodysplasia demonstrated.  Catheter was exchanged for a Chung 2.5 catheter.  This catheter was formed over the bifurcation.  Selective IMA catheterization was performed.  IMA angiograms were performed next.  IMA:  IMA is widely patent.  The colic and superior hemorrhoidal branches are patent.  No evidence of active bleeding or abnormal vascularity demonstrated.  Catheter was advanced into the celiac origin.  Celiac origin was selected over a guide wire.  Selective celiac angiogram performed.  Celiac origin is widely  patent. Common hepatic, right and left hepatic arteries, and gastroduodenal artery, splenic arteries are all patent.  No evidence of active bleeding.  Catheter was removed over a guidewire.  Sheath was removed. Hemostasis obtained with an Exoseal device.  No immediate complication.  The patient tolerated the procedure well.  IMPRESSION: Negative celiac, SMA and IMA angiograms for active bleeding or acute vascular abnormality.   Original Report Authenticated By: Judie Petit. Miles Costain, M.D.   Ir Angiogram Visceral Selective  07/05/2013   *RADIOLOGY REPORT*  Clinical Data: Acute GI bleeding, tachycardia, hypotension  ULTRASOUND GUIDANCE VASCULAR ACCESS CELIAC, SMA, AND IMA CATHETERIZATIONS AND ANGIOGRAMS  Date:  07/05/2013 18:00:00  Radiologist:  M. Ruel Favors, M.D.  Medications:  1% lidocaine locally , 4 mg Versed 100 mcg Fentanyl for conscious sedation  Guidance:  Ultrasound fluoroscopic  Fluoroscopy time:  7.9 minutes  Sedation time:  40 minutes  Contrast volume:  180 ml Omnipaque-300  Complications:  No immediate  PROCEDURE/FINDINGS:  Informed consent was obtained from the patient following explanation of the procedure, risks, benefits and alternatives. The patient understands, agrees and consents for the procedure. All questions were addressed.  A time out was performed.  Maximal barrier sterile technique utilized including caps, mask, sterile gowns, sterile gloves, large sterile drape, hand hygiene, and Chloroprep  Under sterile conditions and local anesthesia, ultrasound micropuncture access was performed the right common femoral artery. Images obtained for documentation.  5-French sheath inserted over a guide wire.  C2 catheter utilized to select the SMA origin over a Bentson guide wire.  Selective SMA angiograms performed.  SMA:  SMA is widely patent.  Main ileocolic trunk is patent and mesentery.  Jejunal and colic branches are visualized.  No evidence of active contrast extravasation, active bleeding, or vascular  abnormality.  No early draining vein, AVM, or angiodysplasia demonstrated.  Catheter was exchanged for a Chung 2.5 catheter.  This catheter was formed over the bifurcation.  Selective IMA catheterization was performed.  IMA angiograms were performed next.  IMA:  IMA is widely patent.  The colic and superior hemorrhoidal branches are patent.  No evidence of active bleeding or abnormal vascularity demonstrated.  Catheter was advanced into the celiac origin.  Celiac origin was selected over a guide wire.  Selective celiac angiogram performed.  Celiac origin is widely patent. Common hepatic, right and left hepatic arteries, and gastroduodenal artery, splenic arteries are all patent.  No evidence of active bleeding.  Catheter was removed over a guidewire.  Sheath was removed. Hemostasis  obtained with an Exoseal device.  No immediate complication.  The patient tolerated the procedure well.  IMPRESSION: Negative celiac, SMA and IMA angiograms for active bleeding or acute vascular abnormality.   Original Report Authenticated By: Judie Petit. Miles Costain, M.D.   Ir Angiogram Visceral Selective  07/05/2013   *RADIOLOGY REPORT*  Clinical Data: Acute GI bleeding, tachycardia, hypotension  ULTRASOUND GUIDANCE VASCULAR ACCESS CELIAC, SMA, AND IMA CATHETERIZATIONS AND ANGIOGRAMS  Date:  07/05/2013 18:00:00  Radiologist:  M. Ruel Favors, M.D.  Medications:  1% lidocaine locally , 4 mg Versed 100 mcg Fentanyl for conscious sedation  Guidance:  Ultrasound fluoroscopic  Fluoroscopy time:  7.9 minutes  Sedation time:  40 minutes  Contrast volume:  180 ml Omnipaque-300  Complications:  No immediate  PROCEDURE/FINDINGS:  Informed consent was obtained from the patient following explanation of the procedure, risks, benefits and alternatives. The patient understands, agrees and consents for the procedure. All questions were addressed.  A time out was performed.  Maximal barrier sterile technique utilized including caps, mask, sterile gowns, sterile  gloves, large sterile drape, hand hygiene, and Chloroprep  Under sterile conditions and local anesthesia, ultrasound micropuncture access was performed the right common femoral artery. Images obtained for documentation.  5-French sheath inserted over a guide wire.  C2 catheter utilized to select the SMA origin over a Bentson guide wire.  Selective SMA angiograms performed.  SMA:  SMA is widely patent.  Main ileocolic trunk is patent and mesentery.  Jejunal and colic branches are visualized.  No evidence of active contrast extravasation, active bleeding, or vascular abnormality.  No early draining vein, AVM, or angiodysplasia demonstrated.  Catheter was exchanged for a Chung 2.5 catheter.  This catheter was formed over the bifurcation.  Selective IMA catheterization was performed.  IMA angiograms were performed next.  IMA:  IMA is widely patent.  The colic and superior hemorrhoidal branches are patent.  No evidence of active bleeding or abnormal vascularity demonstrated.  Catheter was advanced into the celiac origin.  Celiac origin was selected over a guide wire.  Selective celiac angiogram performed.  Celiac origin is widely patent. Common hepatic, right and left hepatic arteries, and gastroduodenal artery, splenic arteries are all patent.  No evidence of active bleeding.  Catheter was removed over a guidewire.  Sheath was removed. Hemostasis obtained with an Exoseal device.  No immediate complication.  The patient tolerated the procedure well.  IMPRESSION: Negative celiac, SMA and IMA angiograms for active bleeding or acute vascular abnormality.   Original Report Authenticated By: Judie Petit. Miles Costain, M.D.   Ir US Guide Vasc Access Right  07/05/2013   *RADIOLOGY REPORT*  Clinical Data: Acute GI bleeding, tachycardia, hypotension  ULTRASOUND GUIDANCE VASCULAR ACCESS CELIAC, SMA, AND IMA CATHETERIZATIONS AND ANGIOGRAMS  Date:  07/05/2013 18:00:00  Radiologist:  M. Ruel Favors, M.D.  Medications:  1% lidocaine locally , 4 mg  Versed 100 mcg Fentanyl for conscious sedation  Guidance:  Ultrasound fluoroscopic  Fluoroscopy time:  7.9 minutes  Sedation time:  40 minutes  Contrast volume:  180 ml Omnipaque-300  Complications:  No immediate  PROCEDURE/FINDINGS:  Informed consent was obtained from the patient following explanation of the procedure, risks, benefits and alternatives. The patient understands, agrees and consents for the procedure. All questions were addressed.  A time out was performed.  Maximal barrier sterile technique utilized including caps, mask, sterile gowns, sterile gloves, large sterile drape, hand hygiene, and Chloroprep  Under sterile conditions and local anesthesia, ultrasound micropuncture access was performed the right common  femoral artery. Images obtained for documentation.  5-French sheath inserted over a guide wire.  C2 catheter utilized to select the SMA origin over a Bentson guide wire.  Selective SMA angiograms performed.  SMA:  SMA is widely patent.  Main ileocolic trunk is patent and mesentery.  Jejunal and colic branches are visualized.  No evidence of active contrast extravasation, active bleeding, or vascular abnormality.  No early draining vein, AVM, or angiodysplasia demonstrated.  Catheter was exchanged for a Chung 2.5 catheter.  This catheter was formed over the bifurcation.  Selective IMA catheterization was performed.  IMA angiograms were performed next.  IMA:  IMA is widely patent.  The colic and superior hemorrhoidal branches are patent.  No evidence of active bleeding or abnormal vascularity demonstrated.  Catheter was advanced into the celiac origin.  Celiac origin was selected over a guide wire.  Selective celiac angiogram performed.  Celiac origin is widely patent. Common hepatic, right and left hepatic arteries, and gastroduodenal artery, splenic arteries are all patent.  No evidence of active bleeding.  Catheter was removed over a guidewire.  Sheath was removed. Hemostasis obtained with an  Exoseal device.  No immediate complication.  The patient tolerated the procedure well.  IMPRESSION: Negative celiac, SMA and IMA angiograms for active bleeding or acute vascular abnormality.   Original Report Authenticated By: Judie Petit. Miles Costain, M.D.   Portable Chest Xray  07/06/2013   *RADIOLOGY REPORT*  Clinical Data: Status post right hemicolectomy for lower GI bleed.  PORTABLE CHEST - 1 VIEW  Comparison: 07/05/2013  Findings: Central line positioning stable in the lower SVC. Endotracheal tube is present with the tip approximately 2 cm above the carina.  A nasogastric tube extends into the stomach.  Lungs show bibasilar atelectasis.  No pulmonary edema or focal airspace consolidation is seen.  The heart size and mediastinal contours are within normal limits.  No significant pleural effusions are identified.  IMPRESSION: Endotracheal tube tip is approximately 2 cm above the carina. Lungs show bibasilar atelectasis.   Original Report Authenticated By: Irish Lack, M.D.   Dg Chest Port 1 View  07/05/2013   *RADIOLOGY REPORT*  Clinical Data: Central line placement.  GI bleed.  PORTABLE CHEST - 1 VIEW  Comparison: 07/01/2013  Findings: Jugular vein catheter has been inserted and the tip is in the superior vena cava in good position.  No pneumothorax.  Heart size and vascularity are normal and the lungs are clear.  IMPRESSION: Central line good position.  Clear lungs.   Original Report Authenticated By: Francene Boyers, M.D.   Dg Chest Portable 1 View  07/01/2013   *RADIOLOGY REPORT*  Clinical Data: Rectal bleeding  PORTABLE CHEST - 1 VIEW  Comparison: CTA chest dated 09/18/2012  Findings: Lungs are clear. No pleural effusion or pneumothorax.  The heart is top normal in size.  Mild right perihilar/paramediastinal prominence, likely related to technique.  IMPRESSION: No evidence of acute cardiopulmonary disease.  Mild right perihilar/paramediastinal prominence, likely related to technique.  Consider follow-up  PA/lateral chest radiographs for further evaluation.   Original Report Authenticated By: Charline Bills, M.D.   Ct Angio Abd/pel W/ And/or W/o  07/05/2013   *RADIOLOGY REPORT*  Clinical Data:  GI bleeding, back pain.  CT ANGIOGRAPHY ABDOMEN AND PELVIS  Technique:  Multidetector CT imaging of the abdomen and pelvis was performed using the standard protocol during bolus administration of intravenous contrast.  Multiplanar reconstructed images including MIPs were obtained and reviewed to evaluate the vascular anatomy.  Contrast: OMNIPAQUE  IOHEXOL 350 MG/ML SOLN  Comparison:   None.  Arterial findings: Extensive coronary arterial calcifications. Aorta:                  Patchy atheromatous calcifications predominately in the infrarenal segment.  No dissection, aneurysm, or stenosis.  Celiac axis:            Patent, unremarkable  Superior mesenteric:Patent, classic distal branching.  Left renal:             Solitary, with partially calcified ostial plaque resulting in at least mild stenosis, patent distally.  Right renal:            Solitary, widely patent  Inferior mesenteric:Patent  Left iliac:             Scattered nonocclusive plaque, otherwise unremarkable.  Right iliac:            Mild scattered nonocclusive plaque, otherwise unremarkable.  Venous findings:  Patent hepatic veins, portal vein, superior mesenteric vein, splenic vein, bilateral renal veins, IVC, and iliac venous system.   Review of the MIP images confirms the above findings.  Nonvascular findings: Minimal dependent atelectasis in the visualized lung bases.  Unremarkable liver, gallbladder, spleen, adrenal glands, left kidney.  4.7 cm right parapelvic renal cyst and a smaller 2.4 cm exophytic cystic lesion from the right renal lower pole with some peripheral coarse calcifications.  No hydronephrosis.  Normal pancreas.  Stomach, small bowel, colon nondilated.  Normal appendix.  Scattered colonic diverticula most numerous in the sigmoid  segment, without adjacent inflammatory/edematous change.  Scattered pelvic phleboliths. Urinary bladder is physiologically distended.  No ascites.  No free air.  Spondylitic changes in the lower lumbar spine.  No adenopathy.  IMPRESSION:  1.  Mild aortoiliac atheromatous plaque without significant visceral or renal artery stenosis or other significant vascular lesion. 2.  Scattered colonic diverticula.   Original Report Authenticated By: D. Andria Rhein, MD    Microbiology: No results found for this or any previous visit (from the past 240 hour(s)).   Labs: Basic Metabolic Panel:  Recent Labs Lab 07/09/13 0500 07/10/13 1015 07/11/13 0500 07/12/13 0959 07/13/13 0650  NA 143 143 142 135 140  K 3.6 3.4* 3.7 3.7 2.9*  CL 113* 111 107 100 104  CO2 23 24 26 26 29   GLUCOSE 206* 190* 188* 284* 177*  BUN 12 12 11 11 10   CREATININE 0.90 0.85 0.92 0.91 0.94  CALCIUM 7.5* 7.5* 7.8* 7.9* 7.5*   Liver Function Tests:  Recent Labs Lab 07/09/13 0500 07/10/13 1015 07/11/13 0500 07/12/13 0959 07/13/13 0650  AST 16 19 21 21 14   ALT 10 12 13 22 17   ALKPHOS 39 39 48 53 48  BILITOT 0.7 0.6 1.0 1.2 0.6  PROT 4.1* 4.1* 4.4* 4.6* 4.0*  ALBUMIN 1.8* 1.8* 1.9* 1.9* 1.7*   No results found for this basename: LIPASE, AMYLASE,  in the last 168 hours No results found for this basename: AMMONIA,  in the last 168 hours CBC:  Recent Labs Lab 07/09/13 0500 07/10/13 1015 07/11/13 0500 07/12/13 0959 07/13/13 0650  WBC 9.6 7.7 8.2 8.5 5.4  NEUTROABS  --  6.2 6.5 6.7 4.0  HGB 7.3* 6.7* 8.6* 9.3* 8.2*  HCT 21.5* 20.0* 25.9* 27.1* 23.8*  MCV 90.0 90.5 88.7 87.1 86.9  PLT 146* 179 211 209 217   Cardiac Enzymes: No results found for this basename: CKTOTAL, CKMB, CKMBINDEX, TROPONINI,  in the last 168 hours BNP: BNP (last 3 results) No  results found for this basename: PROBNP,  in the last 8760 hours CBG:  Recent Labs Lab 07/12/13 1109 07/12/13 1529 07/12/13 1934 07/12/13 2143 07/13/13 0607   GLUCAP 246* 196* 214* 203* 168*       Signed:  Michaelah Credeur  [facility] 07/13/2013, 9:40 AM

## 2013-07-13 NOTE — Progress Notes (Signed)
Co-signed for Kimberly Lutterloh RN for medications administrations, assessments, progress notes, I & O's, care plans, and patient education. Claressa Hughley M, RN, BSN  

## 2013-07-13 NOTE — Progress Notes (Signed)
Physical Therapy Treatment Patient Details Name: ALICIA ACKERT MRN: [id number] DOB: 05-04-1941 Today's Date: 07/13/2013 Time: 5621-3086 PT Time Calculation (min): 25 min  PT Assessment / Plan / Recommendation  History of Present Illness 72 year old male with history of extensive diverticulosis, s/p recent right inguinal hernia repair (06/29/13) with persistent lower GI bleed and hypotension.  Pt transfused >10 units of blood.  Underwent rt hemicolectomy on 07/06/13.   PT Comments   Pt eager to d/c home.  Pt mobilizing at an appropriate level to safely d/c home when MD feels medically ready  Follow Up Recommendations  Home health PT;Supervision/Assistance - 24 hour     Does the patient have the potential to tolerate intense rehabilitation     Barriers to Discharge        Equipment Recommendations  Rolling walker with 5" wheels    Recommendations for Other Services    Frequency Min 3X/week   Progress towards PT Goals Progress towards PT goals: Progressing toward goals  Plan Current plan remains appropriate    Precautions / Restrictions Precautions Precautions: Fall Restrictions Weight Bearing Restrictions: No   Pertinent Vitals/Pain . C/o mild abdominal discomfort & scrotal area pain due to swelling     Mobility  Bed Mobility Bed Mobility: Not assessed Details for Bed Mobility Assistance: Pt deferred bed mobility.  States he feels fine with getting in/out of bed Transfers Transfers: Sit to Stand;Stand to Sit Sit to Stand: 4: Min guard;With upper extremity assist;With armrests;From chair/3-in-1 Stand to Sit: 5: Supervision;With upper extremity assist;With armrests;To chair/3-in-1 Details for Transfer Assistance: Slow to achieve standing.  Braces back of LE's against recliner for support/stability.  Cues for controlled descent.  Ambulation/Gait Ambulation/Gait Assistance: 5: Supervision Ambulation Distance (Feet): 200 Feet Assistive device: Rolling walker Ambulation/Gait  Assistance Details: Cues for tall posture & to stay closer to RW.  Pt steady but still relies heavily on RW with UE's due to pain & swelling in scrotal area Gait Pattern: Step-through pattern Stairs: Yes Stairs Assistance: 4: Min guard Stairs Assistance Details (indicate cue type and reason): Guarding for safety.  Pt used bil UE's on Lt rail today.  Still has difficulty lifting LE's up to step due to LE's feeling heavy from swelling Stair Management Technique: One rail Left;Forwards;Step to pattern Number of Stairs: 4 Wheelchair Mobility Wheelchair Mobility: No    Exercises General Exercises - Lower Extremity Ankle Circles/Pumps: AROM;Both;10 reps Quad Sets: AROM;Both;10 reps Long Arc Quad: AROM;Strengthening;Both;10 reps Hip ABduction/ADduction: AROM;Strengthening;Both;10 reps Straight Leg Raises: AROM;Both;10 reps;Strengthening Hip Flexion/Marching: AROM;Strengthening;Both;10 reps     PT Goals (current goals can now be found in the care plan section) Acute Rehab PT Goals Patient Stated Goal: Return home PT Goal Formulation: With patient Time For Goal Achievement: 07/16/13 Potential to Achieve Goals: Good  Visit Information  Last PT Received On: 07/13/13 Assistance Needed: +1 History of Present Illness: 72 year old male with history of extensive diverticulosis, s/p recent right inguinal hernia repair (06/29/13) with persistent lower GI bleed and hypotension.  Pt transfused >10 units of blood.  Underwent rt hemicolectomy on 07/06/13.    Subjective Data  Patient Stated Goal: Return home   Cognition  Cognition Arousal/Alertness: Awake/alert Behavior During Therapy: WFL for tasks assessed/performed Overall Cognitive Status: Within Functional Limits for tasks assessed    Balance     End of Session PT - End of Session Equipment Utilized During Treatment: Gait belt Activity Tolerance: Patient tolerated treatment well Patient left: in chair;with call bell/phone within reach;with  family/visitor present Nurse Communication: Mobility status   GP     Lara Mulch 07/13/2013, 10:58 AM  Verdell Face, PTA [phone] 07/13/2013

## 2013-07-13 NOTE — Progress Notes (Signed)
D/c IV, tele, to home. Pt given dc instructions with pt and wife, no questions, no complaints. Pt unit leaving via wheel chair and appears in no acute distress.

## 2013-07-13 NOTE — Progress Notes (Signed)
In to speak with pt. About home health services.  Pt. Interested in having HH PT, and after looking over home health agency list, he chose [facility].  TC to [place], with [facility], to give referral.  Pt. States he has a rolling walker at home.   Pt. To dc home today with spouse. Tera Mater, RN, BSN NCM [phone]

## 2013-07-13 NOTE — Progress Notes (Signed)
The patient did not have any acute changes overnight. 

## 2013-07-17 ENCOUNTER — Ambulatory Visit (INDEPENDENT_AMBULATORY_CARE_PROVIDER_SITE_OTHER): Payer: Medicare Other

## 2013-07-17 ENCOUNTER — Ambulatory Visit (INDEPENDENT_AMBULATORY_CARE_PROVIDER_SITE_OTHER): Payer: Medicare Other | Admitting: Surgery

## 2013-07-17 DIAGNOSIS — Z4802 Encounter for removal of sutures: Secondary | ICD-10-CM

## 2013-07-17 DIAGNOSIS — K922 Gastrointestinal hemorrhage, unspecified: Secondary | ICD-10-CM

## 2013-07-17 NOTE — Progress Notes (Signed)
[facility]  Ovidio Kin, MD,  FACS [address] [place]  [address] [place], [place]    [address] Phone:  [phone] FAX:  [phone]   Re:   Timothy [name] DOB:   03-28-1941 MRN:   [id number]  Urgent Office  ASSESSMENT AND PLAN: 1.  Right partial colectomy - 07/06/2013 - D. [name]  This was for lower GI bleed identified at the hepatic flexure.  Final path was benign.   2.  Staples removed earlier today and wound opened up.  There is about a 8 cm area that I cleaned and closed with 2-0 nylon.  [photo at the end of the note]  He will recheck wound with Dr. [name] this Friday, 11/15.  3.  Right inguinal hernia repair - 06/29/2013 - T. Gerkin  HISTORY OF PRESENT ILLNESS: No chief complaint on file.   Timothy [name] is a 72 y.o. (DOB: [birth year])  white  male who is a patient of [name], MD and comes to the Urgent Office today for open wound.  The patient had an urgent right colectomy for lower GI bleed by Dr. [name] on 07/06/2013.  He came to the office today for staple removal by Horatio Pel.  He went home, but the wound popped open and he called me.  I had him come back to the office. The wound was doing okay before the staple removal.  There was no redness, drainage, and he was having no fever. I think that it is worth trying to close the wound.  The patient understands that there is some risk of the wound becoming infected and the whole wound having to be opened up.  Social History: Wife with patient.  PHYSICAL EXAM: There were no vitals taken for this visit.  Abdomen:  Open wound about 5 cm.  In cleaning this, I enlarged it to 8 cm.  There was some debris that I cleaned out.  There was no evidence of infection.  Procedure:  I cleaned the wound with both Betadine and Choroprep.  I infiltrated the skin with 20 cc of 1% xylocaine.  I cleaned the wound again to remove any debris.  I closed the wound with 5 interrupted 2-0 Nylon  sutures.    DATA REVIEWED: Epic notes.  Ovidio Kin, MD,  [facility] [address]  [address]   [place], [place]    [address] Phone:  [phone] FAX:  [phone]

## 2013-07-17 NOTE — Progress Notes (Signed)
Pt comes in today post [facility] repair on 7/24.  The incision looks good, completely closed in most areas.  I moistened the incision with chloraprep and began to remove the 25 staples.  There was on staple which appeared to have the skin grown over it.  I sought Neysa Bonito, CMA assistance - more for an extra set of eyes.  Christy and I agreed that I should attempt to remove the staple.  Once the staple remover was in place, the staple "popped" out from under the skin and I was able to remove it properly.

## 2013-07-19 ENCOUNTER — Encounter (HOSPITAL_COMMUNITY): Payer: Self-pay

## 2013-07-19 ENCOUNTER — Other Ambulatory Visit: Payer: Self-pay

## 2013-07-19 ENCOUNTER — Encounter (HOSPITAL_COMMUNITY): Payer: Self-pay | Admitting: Emergency Medicine

## 2013-07-19 ENCOUNTER — Other Ambulatory Visit (HOSPITAL_COMMUNITY): Payer: Self-pay | Admitting: Family Medicine

## 2013-07-19 ENCOUNTER — Telehealth (INDEPENDENT_AMBULATORY_CARE_PROVIDER_SITE_OTHER): Payer: Self-pay | Admitting: *Deleted

## 2013-07-19 ENCOUNTER — Ambulatory Visit (HOSPITAL_COMMUNITY)
Admission: RE | Admit: 2013-07-19 | Discharge: 2013-07-19 | Disposition: A | Discharge status: Home / Self Care | Payer: Medicare Other | Source: Ambulatory Visit | Attending: Family Medicine | Admitting: Family Medicine

## 2013-07-19 ENCOUNTER — Inpatient Hospital Stay (HOSPITAL_COMMUNITY)
Admission: EM | Admit: 2013-07-19 | Discharge: 2013-07-22 | DRG: 176 | Disposition: A | Discharge status: Home / Self Care | Payer: Medicare Other | Attending: Internal Medicine | Admitting: Internal Medicine

## 2013-07-19 DIAGNOSIS — I2699 Other pulmonary embolism without acute cor pulmonale: Secondary | ICD-10-CM

## 2013-07-19 DIAGNOSIS — E876 Hypokalemia: Secondary | ICD-10-CM

## 2013-07-19 DIAGNOSIS — E785 Hyperlipidemia, unspecified: Secondary | ICD-10-CM | POA: Diagnosis present

## 2013-07-19 DIAGNOSIS — E119 Type 2 diabetes mellitus without complications: Secondary | ICD-10-CM | POA: Diagnosis present

## 2013-07-19 DIAGNOSIS — Z9049 Acquired absence of other specified parts of digestive tract: Secondary | ICD-10-CM

## 2013-07-19 DIAGNOSIS — D649 Anemia, unspecified: Secondary | ICD-10-CM | POA: Diagnosis present

## 2013-07-19 DIAGNOSIS — Z86711 Personal history of pulmonary embolism: Secondary | ICD-10-CM

## 2013-07-19 DIAGNOSIS — Z9889 Other specified postprocedural states: Secondary | ICD-10-CM | POA: Insufficient documentation

## 2013-07-19 DIAGNOSIS — I2510 Atherosclerotic heart disease of native coronary artery without angina pectoris: Secondary | ICD-10-CM | POA: Diagnosis present

## 2013-07-19 LAB — CBC WITH DIFFERENTIAL/PLATELET
Eosinophils Relative: 1 % (ref 0–5)
HCT: 30.7 % — ABNORMAL LOW (ref 39.0–52.0)
Hemoglobin: 9.7 g/dL — ABNORMAL LOW (ref 13.0–17.0)
Lymphocytes Relative: 11 % — ABNORMAL LOW (ref 12–46)
Lymphs Abs: 0.9 10*3/uL (ref 0.7–4.0)
MCV: 89.8 fL (ref 78.0–100.0)
Monocytes Absolute: 0.7 10*3/uL (ref 0.1–1.0)
Neutro Abs: 6.5 10*3/uL (ref 1.7–7.7)
RBC: 3.42 MIL/uL — ABNORMAL LOW (ref 4.22–5.81)
WBC: 8.2 10*3/uL (ref 4.0–10.5)

## 2013-07-19 LAB — URINE MICROSCOPIC-ADD ON

## 2013-07-19 LAB — URINALYSIS, ROUTINE W REFLEX MICROSCOPIC
Bilirubin Urine: NEGATIVE
Hgb urine dipstick: NEGATIVE
Nitrite: NEGATIVE
Protein, ur: NEGATIVE mg/dL
Specific Gravity, Urine: >1.046 — ABNORMAL HIGH (ref 1.005–1.030)
Urobilinogen, UA: 1 mg/dL (ref 0.0–1.0)

## 2013-07-19 LAB — COMPREHENSIVE METABOLIC PANEL
ALT: 17 U/L (ref 0–53)
AST: 17 U/L (ref 0–37)
CO2: 24 mEq/L (ref 19–32)
Calcium: 8.4 mg/dL (ref 8.4–10.5)
Chloride: 103 mEq/L (ref 96–112)
Creatinine, Ser: 1.12 mg/dL (ref 0.50–1.35)
GFR calc Af Amer: 73 mL/min — ABNORMAL LOW (ref >90)
GFR calc non Af Amer: 63 mL/min — ABNORMAL LOW (ref >90)
Glucose, Bld: 163 mg/dL — ABNORMAL HIGH (ref 70–99)
Total Bilirubin: 0.4 mg/dL (ref 0.3–1.2)

## 2013-07-19 LAB — APTT: aPTT: 28 seconds (ref 24–37)

## 2013-07-19 MED ORDER — LEVALBUTEROL HCL 0.63 MG/3ML IN NEBU: 0.6300 mg | INHALATION_SOLUTION | Freq: Four times a day (QID) | RESPIRATORY_TRACT | Status: DC | PRN

## 2013-07-19 MED ORDER — SODIUM CHLORIDE 0.9 % IV SOLN: 250.0000 mL | INTRAVENOUS | Status: DC | PRN

## 2013-07-19 MED ORDER — OXYCODONE-ACETAMINOPHEN 5-325 MG PO TABS
1.0000 | ORAL_TABLET | ORAL | Status: DC | PRN
  Administered 2013-07-20 – 2013-07-22 (×7): 1 via ORAL
  Filled 2013-07-19 (×2): qty 1
  Filled 2013-07-19: qty 2
  Filled 2013-07-19 (×4): qty 1

## 2013-07-19 MED ORDER — HYDROMORPHONE HCL PF 1 MG/ML IJ SOLN: 0.5000 mg | INTRAMUSCULAR | Status: DC | PRN

## 2013-07-19 MED ORDER — HEPARIN (PORCINE) IN NACL 100-0.45 UNIT/ML-% IJ SOLN
1600.0000 [IU]/h | INTRAMUSCULAR | Status: DC
  Administered 2013-07-19: 1600 [IU]/h via INTRAVENOUS
  Filled 2013-07-19 (×2): qty 250

## 2013-07-19 MED ORDER — LINAGLIPTIN 5 MG PO TABS
5.0000 mg | ORAL_TABLET | Freq: Every day | ORAL | Status: DC
  Administered 2013-07-20 – 2013-07-22 (×3): 5 mg via ORAL
  Filled 2013-07-19 (×3): qty 1

## 2013-07-19 MED ORDER — IOHEXOL 350 MG/ML SOLN
100.0000 mL | Freq: Once | INTRAVENOUS | Status: AC | PRN
  Administered 2013-07-19: 100 mL via INTRAVENOUS

## 2013-07-19 MED ORDER — SODIUM CHLORIDE 0.9 % IJ SOLN: 3.0000 mL | Freq: Two times a day (BID) | INTRAMUSCULAR | Status: DC

## 2013-07-19 MED ORDER — GLIMEPIRIDE 4 MG PO TABS
8.0000 mg | ORAL_TABLET | Freq: Every day | ORAL | Status: DC
  Administered 2013-07-20 – 2013-07-22 (×3): 8 mg via ORAL
  Filled 2013-07-19 (×4): qty 2

## 2013-07-19 MED ORDER — PANTOPRAZOLE SODIUM 40 MG PO TBEC
40.0000 mg | DELAYED_RELEASE_TABLET | Freq: Every day | ORAL | Status: DC
  Administered 2013-07-20 – 2013-07-22 (×3): 40 mg via ORAL
  Filled 2013-07-19 (×3): qty 1

## 2013-07-19 MED ORDER — SIMVASTATIN 5 MG PO TABS
5.0000 mg | ORAL_TABLET | Freq: Every day | ORAL | Status: DC
  Administered 2013-07-20 – 2013-07-21 (×2): 5 mg via ORAL
  Filled 2013-07-19 (×3): qty 1

## 2013-07-19 MED ORDER — LORATADINE 10 MG PO TABS
10.0000 mg | ORAL_TABLET | Freq: Every day | ORAL | Status: DC
  Administered 2013-07-20 – 2013-07-22 (×3): 10 mg via ORAL
  Filled 2013-07-19 (×3): qty 1

## 2013-07-19 MED ORDER — INSULIN ASPART 100 UNIT/ML ~~LOC~~ SOLN
0.0000 [IU] | Freq: Three times a day (TID) | SUBCUTANEOUS | Status: DC
  Administered 2013-07-20: 2 [IU] via SUBCUTANEOUS
  Administered 2013-07-20: 3 [IU] via SUBCUTANEOUS
  Administered 2013-07-21 (×2): 2 [IU] via SUBCUTANEOUS

## 2013-07-19 MED ORDER — SODIUM CHLORIDE 0.9 % IJ SOLN: 3.0000 mL | INTRAMUSCULAR | Status: DC | PRN

## 2013-07-19 NOTE — ED Notes (Signed)
Arrived via w/c chair from CT scan.  Positive for PE. Patient alert and cooperative.

## 2013-07-19 NOTE — ED Provider Notes (Signed)
Medical screening examination/treatment/procedure(s) were performed by non-physician practitioner and as supervising physician I was immediately available for consultation/collaboration.  Layla Maw Ward, DO 07/19/13 2328

## 2013-07-19 NOTE — ED Notes (Signed)
Bed: WA02 Expected date:  Expected time:  Means of arrival:  Comments: [place]

## 2013-07-19 NOTE — Plan of Care (Signed)
Problem: Phase I Progression Outcomes Goal: OOB as tolerated unless otherwise ordered Outcome: Progressing Ordered Bedrest due to PE.

## 2013-07-19 NOTE — ED Provider Notes (Signed)
CSN: [id number]     Arrival date & time 07/19/13  1825 History     First MD Initiated Contact with Patient 07/19/13 1842     Chief Complaint  Patient presents with  . pulmonary embolus    (Consider location/radiation/quality/duration/timing/severity/associated sxs/prior Treatment) HPI Comments: Patient with h/o MI, recent hospitalization for GI bleed s/p R hemicolectomy, was on plavix but this was held after recent hospitalization -- presents with SOB, fatigue, low O2 sat that began this morning. Patient was seen by his PCP, sent for CT scan which shows bilateral PE. He was transported to ED for treatment. No CP, fever. No LE edema. O2 sat was in low 80's earlier today. The onset of this condition was acute. The course is constant. Aggravating factors: none. Alleviating factors: none.    The history is provided by the patient and medical records.    Past Medical History  Diagnosis Date  . Coronary artery disease     NSTEMI 2001, 2003   . Hypertension   . Diabetes mellitus   . History of MI (myocardial infarction)   . Hyperlipidemia   . CAD (coronary artery disease)   . Syncope, carotid sinus   . Inguinal hernia    Past Surgical History  Procedure Laterality Date  . None    . Rotator cuff repair  2013    lt  . Eye surgery  2013    Cataracts-both  . Colonoscopy    . Cardiac catheterization  00,03    stents both times  . Inguinal hernia repair Right 06/29/2013    Procedure: HERNIA REPAIR INGUINAL ADULT;  Surgeon: [name], MD;  Location: [facility];  Service: General;  Laterality: Right;  . Insertion of mesh Right 06/29/2013    Procedure: INSERTION OF MESH;  Surgeon: [name], MD;  Location: [facility];  Service: General;  Laterality: Right;  . Colonoscopy N/A 07/06/2013    Procedure: COLONOSCOPY;  Surgeon: [name], MD;  Location: [facility]COPY;  Service: Endoscopy;  Laterality: N/A;  Tattoo,clips,bicap available  .  Esophagogastroduodenoscopy N/A 07/06/2013    Procedure: ESOPHAGOGASTRODUODENOSCOPY (EGD);  Surgeon: [name], MD;  Location: [facility] ENDOSCOPY;  Service: Endoscopy;  Laterality: N/A;  at bedside  . Laparotomy Right 07/06/2013    Procedure: EXPLORATORY LAPAROTOMY;  Surgeon: [name], MD;  Location: [facility] OR;  Service: General;  Laterality: Right;   Family History  Problem Relation Age of Onset  . Cancer Brother     colon cancer   History  Substance Use Topics  . Smoking status: Never Smoker   . Smokeless tobacco: Not on file  . Alcohol Use: No    Review of Systems  Constitutional: Positive for fatigue. Negative for fever.  HENT: Negative for sore throat and rhinorrhea.   Eyes: Negative for redness.  Respiratory: Positive for shortness of breath. Negative for cough.   Cardiovascular: Negative for chest pain and leg swelling.  Gastrointestinal: Negative for nausea, vomiting, abdominal pain and diarrhea.  Genitourinary: Negative for dysuria.  Musculoskeletal: Negative for myalgias.  Skin: Negative for rash.  Neurological: Negative for headaches.    Allergies  Review of patient's allergies indicates no known allergies.  Home Medications   Current Outpatient Rx  Name  Route  Sig  Dispense  Refill  . acetaminophen (TYLENOL) 500 MG tablet   Oral   Take 1,000 mg by mouth every 6 (six) hours as needed for pain.         Marland Kitchen  glimepiride (AMARYL) 4 MG tablet   Oral   Take 8 mg by mouth daily before breakfast.         . loratadine (CLARITIN) 10 MG tablet   Oral   Take 10 mg by mouth daily.          . metFORMIN (GLUCOPHAGE) 1000 MG tablet   Oral   Take 1,000-1,500 mg by mouth See admin instructions. Takes 1 tablet with breakfast and 1.5 tablets with supper.         Marland Kitchen oxyCODONE-acetaminophen (PERCOCET/ROXICET) 5-325 MG per tablet   Oral   Take 1-2 tablets by mouth every 4 (four) hours as needed.   30 tablet   0   . pantoprazole (PROTONIX) 40 MG tablet    Oral   Take 1 tablet (40 mg total) by mouth daily.   30 tablet   0   . pravastatin (PRAVACHOL) 40 MG tablet   Oral   Take 40 mg by mouth every evening.         . sitaGLIPtin (JANUVIA) 100 MG tablet   Oral   Take 100 mg by mouth daily.          BP 98/61  Pulse 99  Temp(Src) 98.3 F (36.8 C) (Oral)  Resp 16  Ht 6\' 2"  (1.88 m)  Wt 220 lb (99.791 kg)  BMI 28.23 kg/m2  SpO2 92% Physical Exam  Nursing note and vitals reviewed. Constitutional: He appears well-developed and well-nourished.  HENT:  Head: Normocephalic and atraumatic.  Eyes: Conjunctivae are normal. Right eye exhibits no discharge. Left eye exhibits no discharge.  Neck: Normal range of motion. Neck supple.  Cardiovascular: Normal rate, regular rhythm and normal heart sounds.   Pulmonary/Chest: Effort normal and breath sounds normal.  Abdominal: Soft. There is no tenderness.  Neurological: He is alert.  Skin: Skin is warm and dry.  Psychiatric: He has a normal mood and affect.    ED Course   Procedures (including critical care time)  Labs Reviewed  CBC WITH DIFFERENTIAL - Abnormal; Notable for the following:    RBC 3.42 (*)    Hemoglobin 9.7 (*)    HCT 30.7 (*)    Platelets 413 (*)    Neutrophils Relative % 80 (*)    Lymphocytes Relative 11 (*)    All other components within normal limits  COMPREHENSIVE METABOLIC PANEL - Abnormal; Notable for the following:    Glucose, Bld 163 (*)    Total Protein 5.9 (*)    Albumin 2.3 (*)    GFR calc non Af Amer 63 (*)    GFR calc Af Amer 73 (*)    All other components within normal limits  TROPONIN I  PROTIME-INR  APTT  URINALYSIS, ROUTINE W REFLEX MICROSCOPIC  CBC  HEPARIN LEVEL (UNFRACTIONATED)  TYPE AND SCREEN  ABO/RH   Ct Angio Chest Pe W/cm &/or Wo Cm  07/19/2013   *RADIOLOGY REPORT*  Clinical Data: Dizziness, recent abdominal surgery.  Concern for pulmonary embolism.  CT ANGIOGRAPHY CHEST  Technique:  Multidetector CT imaging of the chest using  the standard protocol during bolus administration of intravenous contrast. Multiplanar reconstructed images including MIPs were obtained and reviewed to evaluate the vascular anatomy.  Contrast: OMNIPAQUE IOHEXOL 350 MG/ML SOLN  Comparison: Most recent chest radiograph 07/06/2013, chest CT 09/18/2012  Findings: There is a large volume acute bilateral pulmonary embolism burden involving the left main pulmonary artery and distal segmental branches as well as the right main pulmonary artery and distal  segmental branches.  No CT evidence for right heart strain. Heart size is normal.  Trace pericardial fluid.  No lymphadenopathy.  No pleural effusion.  Curvilinear right lower lobe superior segmental opacity likely represents atelectasis or evolving pulmonary infarct.  No mass, nodule, or other consolidation. Central airways are patent.  No acute osseous finding.  IMPRESSION: Large volume bilateral main pulmonary arterial acute emboli without CT evidence for right heart strain.  Superior segment right lower lobe atelectasis or evolving infarct.  Critical Value/emergent results were called by telephone at the time of interpretation on 07/19/2013 at 6:05 p.m. to Nira Retort, PA, who verbally acknowledged these results. . The patient will be brought to the emergency department by CT staff.   Original Report Authenticated By: Christiana Pellant, M.D.   1. Pulmonary embolism     7:05 PM Patient seen and examined. Work-up initiated. Medications (heparin) ordered. EKG reviewed and d/w Dr. Estell Harpin. Pt stable.   Vital signs reviewed and are as follows: Filed Vitals:   07/19/13 1833  BP:   Pulse:   Temp:   Resp: 16  BP 98/61  Pulse 99  Temp(Src) 98.3 F (36.8 C) (Oral)  Resp 16  Ht 6\' 2"  (1.88 m)  Wt 220 lb (99.791 kg)  BMI 28.23 kg/m2  SpO2 92%   Date: 07/19/2013  Rate: 98  Rhythm: normal sinus rhythm  QRS Axis: right  Intervals: normal  ST/T Wave abnormalities: nonspecific T wave changes   Conduction Disutrbances: LA fascicular block  Narrative Interpretation: new S1Q3T3 present  Old EKG Reviewed: changes noted from 07/03/2013  7:18 PM Patient was discussed with Layla Maw Ward, DO  8:33 PM Patient remains stable.   Spoke with Dr. Susie Cassette who will admit.   MDM  Admit for PE, management in setting of recent GI bleed.   Renne Crigler, PA-C 07/19/13 2034

## 2013-07-19 NOTE — Progress Notes (Addendum)
ANTICOAGULATION CONSULT NOTE - Initial Consult  Pharmacy Consult for heparin Indication: pulmonary embolus  No Known Allergies  Patient Measurements: Height: 6\' 2"  (188 cm) Weight: 220 lb (99.791 kg) IBW/kg (Calculated) : 82.2 Heparin Dosing Weight: 100kg  Vital Signs: Temp: 98.3 F (36.8 C) (08/13 1831) Temp src: Oral (08/13 1831) BP: 98/61 mmHg (08/13 1831) Pulse Rate: 99 (08/13 1831)  Labs: No results found for this basename: HGB, HCT, PLT, APTT, LABPROT, INR, HEPARINUNFRC, CREATININE, CKTOTAL, CKMB, TROPONINI,  in the last 72 hours  Estimated Creatinine Clearance: 80.2 ml/min (by C-G formula based on Cr of 1.02).   Medical History: Past Medical History  Diagnosis Date  . Coronary artery disease     NSTEMI 2001, 2003   . Hypertension   . Diabetes mellitus   . History of MI (myocardial infarction)   . Hyperlipidemia   . CAD (coronary artery disease)   . Syncope, carotid sinus   . Inguinal hernia     Assessment: 22 YOM presents with dizziness.  He is s/p hernia repair on 7/24 and EGD on 7/31 for suspected GIB with no signs of bleeding noted.  Diverticulosis most likely source of bleed per GI note.  CT angio reveals large PE of main pulmonary artery. Orders to start heparin.  Goal of Therapy:  Heparin level 0.3-0.7 units/ml Monitor platelets by anticoagulation protocol: Yes   Plan:   No bolus due to recent surgery and GIB  Start heparin gtt at 1600 units/hr  Check baseline aPTT and PT/INR  Check heparin level 8h after start of heparin gtt  Daily CBC and heparin levels  Monitor CBC closely with recent surgery and GIB  Await plans for oral anticoagulation  Juliette Alcide, PharmD, BCPS.   Pager: [phone]  07/19/2013,7:08 PM

## 2013-07-19 NOTE — Telephone Encounter (Signed)
Wife called to state that patient had a "dizzy spell" this morning.  She states a friend down the street is an EMT and came down and administered O2 which has improved symptoms.  Suggested to wife to make sure PCP is aware of event this morning in case they want to do lab work.  Wife states incision looks good and denies redness, drainage or fever.  Wife will keep [place] updated as to what PCP does.

## 2013-07-19 NOTE — H&P (Signed)
[facility] History and Physical  Timothy [name]:[id number] DOB: Feb 09, 1941 DOA: 07/19/2013  Referring physician: *  PCP: [name], MD    Chief Complaint:    HPI  72 year old male with a history of right colectomy falling lower GI bleeding by Dr. [name] on 7/31 presents to the ER with shortness of breath. The patient was seen by Dr. [name] on 8/11 and had his staples removed , he presented today with shortness of breath and was found to have bilateral pulmonary embolism. He developed sudden onset of dizziness today, and also had some mild chest pain. This prompted him to come to the ER.   is most negative for the following  Constitutional: Denies fever, chills, diaphoresis, appetite change and fatigue.  HEENT: Denies photophobia, eye pain, redness, hearing loss, ear pain, congestion, sore throat, rhinorrhea, sneezing, mouth sores, trouble swallowing, neck pain, neck stiffness and tinnitus.  Respiratory: Denies SOB, DOE, cough, chest tightness, and wheezing.  Cardiovascular: Denies chest pain, palpitations and leg swelling.  Gastrointestinal: Denies nausea, vomiting, abdominal pain, diarrhea, constipation, blood in stool and abdominal distention.  Genitourinary: Denies dysuria, urgency, frequency, hematuria, flank pain and difficulty urinating.  Musculoskeletal: Denies myalgias, back pain, joint swelling, arthralgias and gait problem.  Skin: Denies pallor, rash and wound.  Neurological: Denies dizziness, seizures, syncope, weakness, light-headedness, numbness and headaches.  Hematological: Denies adenopathy. Easy bruising, personal or family bleeding history  Psychiatric/Behavioral: Denies suicidal ideation, mood changes, confusion, nervousness, sleep disturbance and agitation       Past Medical History  Diagnosis Date  . Coronary artery disease     NSTEMI 2001, 2003   . Hypertension   . Diabetes mellitus   . History of MI (myocardial infarction)   .  Hyperlipidemia   . CAD (coronary artery disease)   . Syncope, carotid sinus   . Inguinal hernia      Past Surgical History  Procedure Laterality Date  . None    . Rotator cuff repair  2013    lt  . Eye surgery  2013    Cataracts-both  . Colonoscopy    . Cardiac catheterization  00,03    stents both times  . Inguinal hernia repair Right 06/29/2013    Procedure: HERNIA REPAIR INGUINAL ADULT;  Surgeon: [name], MD;  Location: [facility];  Service: General;  Laterality: Right;  . Insertion of mesh Right 06/29/2013    Procedure: INSERTION OF MESH;  Surgeon: [name], MD;  Location: [facility];  Service: General;  Laterality: Right;  . Colonoscopy N/A 07/06/2013    Procedure: COLONOSCOPY;  Surgeon: [name], MD;  Location: [facility] ENDOSCOPY;  Service: Endoscopy;  Laterality: N/A;  Tattoo,clips,bicap available  . Esophagogastroduodenoscopy N/A 07/06/2013    Procedure: ESOPHAGOGASTRODUODENOSCOPY (EGD);  Surgeon: [name], MD;  Location[facility] ENDOSCOPY;  Service: Endoscopy;  Laterality: N/A;  at bedside  . Laparotomy Right 07/06/2013    Procedure: EXPLORATORY LAPAROTOMY;  Surgeon: [name], MD;  Location: [facility] OR;  Service: General;  Laterality: Right;      Social History:  reports that he has never smoked. He has never used smokeless tobacco. He reports that he does not drink alcohol or use illicit drugs.  Ls?  No Known Allergies  Family History  Problem Relation Age of Onset  . Cancer Brother     colon cancer     Prior to Admission medications   Medication Sig Start Date End Date Taking?  Authorizing Provider  acetaminophen (TYLENOL) 500 MG tablet Take 1,000 mg by mouth every 6 (six) hours as needed for pain.   Yes Historical Provider, MD  glimepiride (AMARYL) 4 MG tablet Take 8 mg by mouth daily before breakfast.   Yes Historical Provider, MD  loratadine (CLARITIN) 10 MG tablet Take 10 mg by mouth daily.    Yes  Historical Provider, MD  metFORMIN (GLUCOPHAGE) 1000 MG tablet Take 1,000-1,500 mg by mouth 2 (two) times daily. Takes 1 tablet with breakfast and 1.5 tablets with supper.   Yes Historical Provider, MD  oxyCODONE-acetaminophen (PERCOCET/ROXICET) 5-325 MG per tablet Take 1-2 tablets by mouth every 4 (four) hours as needed for pain.   Yes Historical Provider, MD  pantoprazole (PROTONIX) 40 MG tablet Take 1 tablet (40 mg total) by mouth daily. 07/13/13  Yes Joseph Art, DO  pravastatin (PRAVACHOL) 40 MG tablet Take 40 mg by mouth every evening.   Yes Historical Provider, MD  sitaGLIPtin (JANUVIA) 100 MG tablet Take 100 mg by mouth daily.   Yes Historical Provider, MD     Physical Exam: Filed Vitals:   07/19/13 1831 07/19/13 1833 07/19/13 2134  BP: 98/61  149/68  Pulse: 99  95  Temp: 98.3 F (36.8 C)    TempSrc: Oral    Resp:  16   Height: 6\' 2"  (1.88 m)    Weight: 99.791 kg (220 lb)    SpO2: 94% 92% 96%     Constitutional: Vital signs reviewed. Patient is a well-developed and well-nourished in no acute distress and cooperative with exam. Alert and oriented x3.  Head: Normocephalic and atraumatic  Ear: TM normal bilaterally  Mouth: no erythema or exudates, MMM  Eyes: PERRL, EOMI, conjunctivae normal, No scleral icterus.  Neck: Supple, Trachea midline normal ROM, No JVD, mass, thyromegaly, or carotid bruit present.  Cardiovascular: RRR, S1 normal, S2 normal, no MRG, pulses symmetric and intact bilaterally  Pulmonary/Chest: CTAB, no wheezes, rales, or rhonchi  Abdominal: Soft. Non-tender, non-distended, bowel sounds are normal, no masses, organomegaly, or guarding present.  GU: no CVA tenderness Musculoskeletal: No joint deformities, erythema, or stiffness, ROM full and no nontender Ext: no edema and no cyanosis, pulses palpable bilaterally (DP and PT)  Hematology: no cervical, inginal, or axillary adenopathy.  Neurological: A&O x3, Strenght is normal and symmetric bilaterally, cranial  nerve II-XII are grossly intact, no focal motor deficit, sensory intact to light touch bilaterally.  Skin: Warm, dry and intact. No rash, cyanosis, or clubbing.  Psychiatric: Normal mood and affect. speech and behavior is normal. Judgment and thought content normal. Cognition and memory are normal.       Labs on Admission:    Basic Metabolic Panel:  Recent Labs Lab 07/13/13 0650 07/13/13 1548 07/19/13 1915  NA 140 139 136  K 2.9* 3.9 4.0  CL 104 102 103  CO2 29 30 24   GLUCOSE 177* 118* 163*  BUN 10 11 10   CREATININE 0.94 1.02 1.12  CALCIUM 7.5* 8.1* 8.4  MG 1.8  --   --    Liver Function Tests:  Recent Labs Lab 07/13/13 0650 07/19/13 1915  AST 14 17  ALT 17 17  ALKPHOS 48 90  BILITOT 0.6 0.4  PROT 4.0* 5.9*  ALBUMIN 1.7* 2.3*   No results found for this basename: LIPASE, AMYLASE,  in the last 168 hours No results found for this basename: AMMONIA,  in the last 168 hours CBC:  Recent Labs Lab 07/13/13 0650 07/19/13 1915  WBC 5.4  8.2  NEUTROABS 4.0 6.5  HGB 8.2* 9.7*  HCT 23.8* 30.7*  MCV 86.9 89.8  PLT 217 413*   Cardiac Enzymes:  Recent Labs Lab 07/19/13 1925  TROPONINI <0.30    BNP (last 3 results) No results found for this basename: PROBNP,  in the last 8760 hours    CBG:  Recent Labs Lab 07/13/13 0607 07/13/13 1046 07/13/13 1549  GLUCAP 168* 167* 122*    Radiological Exams on Admission: Ct Angio Chest Pe W/cm &/or Wo Cm  07/19/2013   *RADIOLOGY REPORT*  Clinical Data: Dizziness, recent abdominal surgery.  Concern for pulmonary embolism.  CT ANGIOGRAPHY CHEST  Technique:  Multidetector CT imaging of the chest using the standard protocol during bolus administration of intravenous contrast. Multiplanar reconstructed images including MIPs were obtained and reviewed to evaluate the vascular anatomy.  Contrast: OMNIPAQUE IOHEXOL 350 MG/ML SOLN  Comparison: Most recent chest radiograph 07/06/2013, chest CT 09/18/2012  Findings: There  is a large volume acute bilateral pulmonary embolism burden involving the left main pulmonary artery and distal segmental branches as well as the right main pulmonary artery and distal segmental branches.  No CT evidence for right heart strain. Heart size is normal.  Trace pericardial fluid.  No lymphadenopathy.  No pleural effusion.  Curvilinear right lower lobe superior segmental opacity likely represents atelectasis or evolving pulmonary infarct.  No mass, nodule, or other consolidation. Central airways are patent.  No acute osseous finding.  IMPRESSION: Large volume bilateral main pulmonary arterial acute emboli without CT evidence for right heart strain.  Superior segment right lower lobe atelectasis or evolving infarct.  Critical Value/emergent results were called by telephone at the time of interpretation on 07/19/2013 at 6:05 p.m. to Nira Retort, PA, who verbally acknowledged these results. . The patient will be brought to the emergency department by CT staff.   Original Report Authenticated By: Christiana Pellant, M.D.    EKG: Independently reviewed.     Assessment/Plan Active Problems:   * No active hospital problems. *    Pulmonary embolism  Started on a heparin gtt May need IVC filter if deemed to be a high risk of bleeding Please consult GI in am  If not a high risk of bleeding transition to coumadin    Status post right partial colectomy on 7/31 Recently had his wound opened up on 8/11 If concern general surgery in the morning    History of coronary artery disease Was on Plavix off Plavix now  Diabetes mellitus start the patient on sliding scale insulin   Code Status:   full Family Communication: bedside Disposition Plan: admit   Time spent: 70 mins   [facility] Pager [phone]  If 7PM-7AM, please contact night-coverage [email or web address] Password [facility] 07/19/2013, 9:55 PM

## 2013-07-20 ENCOUNTER — Telehealth (INDEPENDENT_AMBULATORY_CARE_PROVIDER_SITE_OTHER): Payer: Self-pay

## 2013-07-20 DIAGNOSIS — I2699 Other pulmonary embolism without acute cor pulmonale: Secondary | ICD-10-CM

## 2013-07-20 DIAGNOSIS — D649 Anemia, unspecified: Secondary | ICD-10-CM | POA: Diagnosis present

## 2013-07-20 DIAGNOSIS — E876 Hypokalemia: Secondary | ICD-10-CM

## 2013-07-20 HISTORY — DX: Other pulmonary embolism without acute cor pulmonale: I26.99

## 2013-07-20 LAB — GLUCOSE, CAPILLARY
Glucose-Capillary: 120 mg/dL — ABNORMAL HIGH (ref 70–99)
Glucose-Capillary: 89 mg/dL (ref 70–99)
Glucose-Capillary: 162 mg/dL — ABNORMAL HIGH (ref 70–99)

## 2013-07-20 LAB — COMPREHENSIVE METABOLIC PANEL
Albumin: 2 g/dL — ABNORMAL LOW (ref 3.5–5.2)
BUN: 9 mg/dL (ref 6–23)
Calcium: 8 mg/dL — ABNORMAL LOW (ref 8.4–10.5)
GFR calc Af Amer: 82 mL/min — ABNORMAL LOW (ref >90)
Glucose, Bld: 86 mg/dL (ref 70–99)
Sodium: 137 mEq/L (ref 135–145)
Total Protein: 5.3 g/dL — ABNORMAL LOW (ref 6.0–8.3)

## 2013-07-20 LAB — TROPONIN I
Troponin I: <0.3 ng/mL (ref <0.30)
Troponin I: <0.3 ng/mL (ref <0.30)

## 2013-07-20 LAB — CBC
HCT: 26.8 % — ABNORMAL LOW (ref 39.0–52.0)
Hemoglobin: 8.5 g/dL — ABNORMAL LOW (ref 13.0–17.0)
WBC: 6.7 10*3/uL (ref 4.0–10.5)

## 2013-07-20 LAB — HEPARIN LEVEL (UNFRACTIONATED): Heparin Unfractionated: 0.12 IU/mL — ABNORMAL LOW (ref 0.30–0.70)

## 2013-07-20 LAB — TYPE AND SCREEN: Antibody Screen: NEGATIVE

## 2013-07-20 MED ORDER — HEPARIN (PORCINE) IN NACL 100-0.45 UNIT/ML-% IJ SOLN
2000.0000 [IU]/h | INTRAMUSCULAR | Status: DC
  Filled 2013-07-20 (×2): qty 250

## 2013-07-20 MED ORDER — WARFARIN SODIUM 5 MG PO TABS
5.0000 mg | ORAL_TABLET | Freq: Once | ORAL | Status: AC
  Administered 2013-07-20: 5 mg via ORAL
  Filled 2013-07-20: qty 1

## 2013-07-20 MED ORDER — BIOTENE DRY MOUTH MT LIQD
15.0000 mL | Freq: Two times a day (BID) | OROMUCOSAL | Status: DC
  Administered 2013-07-20 – 2013-07-21 (×3): 15 mL via OROMUCOSAL

## 2013-07-20 MED ORDER — HEPARIN (PORCINE) IN NACL 100-0.45 UNIT/ML-% IJ SOLN
2150.0000 [IU]/h | INTRAMUSCULAR | Status: AC
  Administered 2013-07-20: 2150 [IU]/h via INTRAVENOUS
  Filled 2013-07-20 (×2): qty 250

## 2013-07-20 MED ORDER — COUMADIN BOOK
Freq: Once | Status: AC
  Administered 2013-07-20: 11:00:00
  Filled 2013-07-20: qty 1

## 2013-07-20 MED ORDER — WARFARIN - PHARMACIST DOSING INPATIENT: Freq: Every day | Status: DC

## 2013-07-20 MED ORDER — WARFARIN VIDEO
Freq: Once | Status: AC
  Administered 2013-07-20: 11:00:00

## 2013-07-20 MED ORDER — HEPARIN (PORCINE) IN NACL 100-0.45 UNIT/ML-% IJ SOLN
1800.0000 [IU]/h | INTRAMUSCULAR | Status: DC
  Administered 2013-07-20 (×2): 1800 [IU]/h via INTRAVENOUS
  Filled 2013-07-20 (×3): qty 250

## 2013-07-20 MED ORDER — POTASSIUM CHLORIDE IN NACL 40-0.9 MEQ/L-% IV SOLN
INTRAVENOUS | Status: DC
  Administered 2013-07-20: 50 mL/h via INTRAVENOUS
  Administered 2013-07-20 – 2013-07-21 (×2): via INTRAVENOUS
  Filled 2013-07-20 (×3): qty 1000

## 2013-07-20 NOTE — Progress Notes (Signed)
[facility] PROGRESS NOTE  Timothy [name]:[id number] DOB: [birth year] DOA: 07/19/2013 PCP: [name], MD  Brief narrative: Timothy [name] is an 72 y.o. male with a PMH of recent right colectomy 07/06/2013 who was admitted on 07/19/2013 with bilateral pulmonary emboli.  Assessment/Plan: Principal Problem:   Bilateral pulmonary embolism -Diagnosis CT angiogram. No evidence of right heart strain on CT scan or 12-lead EKG but will get a 2-D echocardiogram given history of coronary artery disease. Admitted and placed on a heparin drip. Discussed various options for anticoagulation with him and his wife. Will initiate Coumadin. -Monitor closely given recent history of GI bleeding secondary to diverticulosis status post colectomy.  Incision site looks good. -Will notify surgery and gastroenterology of the patient's admission. Active Problems:   Normocytic anemia -Likely secondary to recent blood loss. Monitor closely on heparin.   Hypokalemia -Add potassium to IV fluids.   HYPERLIPIDEMIA-MIXED -Continue statin therapy.   CAD, NATIVE VESSEL -No current complaints of chest pain.   Diabetes -Continue Januvia and Amaryl. CBGs 122-167.  Code Status: Full. Family Communication: Wife at bedside. Disposition Plan: Home when stable.   Medical Consultants:  Dr. Darnell Level, Surgery  Other Consultants:  PT  Anti-infectives:  None.  HPI/Subjective: Timothy [name] feels well.  Denies chest pain and shortness of breath. No complaints of lower extremity pain or swelling other than some generalized edema affecting both lower extremities equally.  Objective: Filed Vitals:   07/20/13 0300 07/20/13 0400 07/20/13 0500 07/20/13 0600  BP: 107/61 99/54 120/61 108/53  Pulse: 85 87 85 78  Temp:  98.9 F (37.2 C)    TempSrc:  Oral    Resp: 14 13 14 11   Height:      Weight:      SpO2: 94% 93% 96% 94%    Intake/Output Summary (Last 24 hours) at 07/20/13 0727 Last data  filed at 07/20/13 0600  Gross per 24 hour  Intake    344 ml  Output    600 ml  Net   -256 ml    Exam: Gen:  NAD Cardiovascular:  RRR, No M/R/G Respiratory:  Lungs CTAB Gastrointestinal:  Abdomen soft, NT/ND, + BS Extremities:  Trace edema bilaterally  Data Reviewed: Basic Metabolic Panel:  Recent Labs Lab 07/13/13 1548 07/19/13 1915 07/20/13 0349  NA 139 136 137  K 3.9 4.0 3.4*  CL 102 103 105  CO2 30 24 26   GLUCOSE 118* 163* 86  BUN 11 10 9   CREATININE 1.02 1.12 1.02  CALCIUM 8.1* 8.4 8.0*   GFR Estimated Creatinine Clearance: 80.7 ml/min (by C-G formula based on Cr of 1.02). Liver Function Tests:  Recent Labs Lab 07/19/13 1915 07/20/13 0349  AST 17 11  ALT 17 13  ALKPHOS 90 79  BILITOT 0.4 0.5  PROT 5.9* 5.3*  ALBUMIN 2.3* 2.0*   Coagulation profile  Recent Labs Lab 07/19/13 1925  INR 1.08    CBC:  Recent Labs Lab 07/19/13 1915 07/20/13 0550  WBC 8.2 6.7  NEUTROABS 6.5  --   HGB 9.7* 8.5*  HCT 30.7* 26.8*  MCV 89.8 89.9  PLT 413* 378   Cardiac Enzymes:  Recent Labs Lab 07/19/13 1925 07/19/13 2140 07/20/13 0349  TROPONINI <0.30 <0.30 <0.30   CBG:  Recent Labs Lab 07/13/13 1046 07/13/13 1549  GLUCAP 167* 122*   Microbiology No results found for this or any previous visit (from the past 240 hour(s)).   Procedures and Diagnostic Studies: Ct Angio Chest Pe W/cm &/  or Wo Cm  07/19/2013   *RADIOLOGY REPORT*  Clinical Data: Dizziness, recent abdominal surgery.  Concern for pulmonary embolism.  CT ANGIOGRAPHY CHEST  Technique:  Multidetector CT imaging of the chest using the standard protocol during bolus administration of intravenous contrast. Multiplanar reconstructed images including MIPs were obtained and reviewed to evaluate the vascular anatomy.  Contrast: OMNIPAQUE IOHEXOL 350 MG/ML SOLN  Comparison: Most recent chest radiograph 07/06/2013, chest CT 09/18/2012  Findings: There is a large volume acute bilateral pulmonary  embolism burden involving the left main pulmonary artery and distal segmental branches as well as the right main pulmonary artery and distal segmental branches.  No CT evidence for right heart strain. Heart size is normal.  Trace pericardial fluid.  No lymphadenopathy.  No pleural effusion.  Curvilinear right lower lobe superior segmental opacity likely represents atelectasis or evolving pulmonary infarct.  No mass, nodule, or other consolidation. Central airways are patent.  No acute osseous finding.  IMPRESSION: Large volume bilateral main pulmonary arterial acute emboli without CT evidence for right heart strain.  Superior segment right lower lobe atelectasis or evolving infarct.  Critical Value/emergent results were called by telephone at the time of interpretation on 07/19/2013 at 6:05 p.m. to Timothy Retort, PA, who verbally acknowledged these results. . The patient will be brought to the emergency department by CT staff.   Original Report Authenticated By: Christiana Pellant, M.D.   Scheduled Meds: . glimepiride  8 mg Oral QAC breakfast  . insulin aspart  0-15 Units Subcutaneous TID WC  . linagliptin  5 mg Oral Daily  . loratadine  10 mg Oral Daily  . pantoprazole  40 mg Oral Daily  . simvastatin  5 mg Oral q1800  . sodium chloride  3 mL Intravenous Q12H  . sodium chloride  3 mL Intravenous Q12H   Continuous Infusions: . heparin 1,800 Units/hr (07/20/13 0530)    Time spent: 35 minutes with greater than 50% of the time spent counseling the patient and his wife on current diagnostic test results, impression, and plan of care.   LOS: 1 day   Timothy [name]  [facility] Pager [phone].   *Please note that the hospitalists switch teams on Wednesdays. Please call the flow manager at [phone] if you are having difficulty reaching the hospitalist taking care of this patient as she can update you and provide the most up-to-date pager number of provider caring for the patient. If 8PM-8AM,  please contact night-coverage at [email or web address], password [facility]  07/20/2013, 7:27 AM

## 2013-07-20 NOTE — Progress Notes (Signed)
[id number]/Rhonda Davis, RN, BSN, CCN: [phone]/ Case management. Chart reviewed for discharge planning and present needs. Discharge needs: none present at time of review. Next chart review due:  [id number] 

## 2013-07-20 NOTE — Telephone Encounter (Signed)
Dr. Darnelle Catalan called requesting [facility] see pt for po colon surgery by Dr. [name] while in the hospital.  Pt was admitted for pulmonary embolism and will not be able to come to office for post op appt.  Emina paged and she will go by ICU and see the patient.

## 2013-07-20 NOTE — Progress Notes (Signed)
Subjective: Pt resting comfortably in bed.  Wife is at bedside.  hospitalized for bilateral PEs, on heparin gtt and warfarin.  Objective: Vital signs in last 24 hours: Temp:  [97.8 F (36.6 C)-98.9 F (37.2 C)] 97.8 F (36.6 C) (08/14 0800) Pulse Rate:  [78-107] 94 (08/14 1200) Resp:  [11-19] 17 (08/14 1200) BP: (98-149)/(52-68) 124/57 mmHg (08/14 1200) SpO2:  [92 %-97 %] 95 % (08/14 1200) Weight:  [220 lb (99.791 kg)-223 lb 1.7 oz (101.2 kg)] 223 lb 1.7 oz (101.2 kg) (08/13 2206) Last BM Date: 07/18/13  Intake/Output from previous day: 08/13 0701 - 08/14 0700 In: 382 [I.V.:382] Out: 600 [Urine:600] Intake/Output this shift: Total I/O In: 770.8 [P.O.:480; I.V.:290.8] Out: 300 [Urine:300]  GI: abdomen is soft round and non tender.  Right groin incision is clean dry and intact.  Midline incision looks excellent with approximated edges, no erythema.  Sutures and steri strips are intact.  Lab Results:   Recent Labs  07/19/13 1915 07/20/13 0550  WBC 8.2 6.7  HGB 9.7* 8.5*  HCT 30.7* 26.8*  PLT 413* 378   BMET  Recent Labs  07/19/13 1915 07/20/13 0349  NA 136 137  K 4.0 3.4*  CL 103 105  CO2 24 26  GLUCOSE 163* 86  BUN 10 9  CREATININE 1.12 1.02  CALCIUM 8.4 8.0*   PT/INR  Recent Labs  07/19/13 1925  LABPROT 13.8  INR 1.08   ABG No results found for this basename: PHART, PCO2, PO2, HCO3,  in the last 72 hours  Studies/Results: Ct Angio Chest Pe W/cm &/or Wo Cm  07/19/2013   *RADIOLOGY REPORT*  Clinical Data: Dizziness, recent abdominal surgery.  Concern for pulmonary embolism.  CT ANGIOGRAPHY CHEST  Technique:  Multidetector CT imaging of the chest using the standard protocol during bolus administration of intravenous contrast. Multiplanar reconstructed images including MIPs were obtained and reviewed to evaluate the vascular anatomy.  Contrast: OMNIPAQUE IOHEXOL 350 MG/ML SOLN  Comparison: Most recent chest radiograph 07/06/2013, chest CT  09/18/2012  Findings: There is a large volume acute bilateral pulmonary embolism burden involving the left main pulmonary artery and distal segmental branches as well as the right main pulmonary artery and distal segmental branches.  No CT evidence for right heart strain. Heart size is normal.  Trace pericardial fluid.  No lymphadenopathy.  No pleural effusion.  Curvilinear right lower lobe superior segmental opacity likely represents atelectasis or evolving pulmonary infarct.  No mass, nodule, or other consolidation. Central airways are patent.  No acute osseous finding.  IMPRESSION: Large volume bilateral main pulmonary arterial acute emboli without CT evidence for right heart strain.  Superior segment right lower lobe atelectasis or evolving infarct.  Critical Value/emergent results were called by telephone at the time of interpretation on 07/19/2013 at 6:05 p.m. to Nira Retort, PA, who verbally acknowledged these results. . The patient will be brought to the emergency department by CT staff.   Original Report Authenticated By: Christiana Pellant, M.D.    Assessment/Plan: Right hemicolectomy 07/06/13 by Dr. [name] d/t GI bleed preceded by right inguinal hernia repair on 06/29/13 by Dr. Gerrit Friends The wound looks excellent.  May leave it open to air.  I will arrange a follow up with Dr. [name] in 7-10 days for a post operative check and to remove the stitches which were placed in our urgent clinic this past Monday 07/17/13.  I provided his wife with a work statement.  Thank you for consult.  Please do not hesitate to  contact [facility] with any questions or concerns.     LOS: 1 day    Ashok Norris ANP--BC Pager [phone]  07/20/2013 12:56 PM  He looks good and otherwise feels okay.  Abdomen looks fine.  Wound without infection.  Would not remove sutures yet but we can do this in the office at follow up.  Anticoagulation and management of PE per primary team.

## 2013-07-20 NOTE — Progress Notes (Signed)
ANTICOAGULATION CONSULT NOTE - Follow Up Consult  Pharmacy Consult for Heparin Indication: pulmonary embolus  No Known Allergies  Patient Measurements: Height: 6\' 2"  (188 cm) Weight: 223 lb 1.7 oz (101.2 kg) IBW/kg (Calculated) : 82.2 Heparin Dosing Weight:   Vital Signs: Temp: 98.1 F (36.7 C) (08/13 2206) Temp src: Oral (08/13 2206) BP: 120/61 mmHg (08/14 0500) Pulse Rate: 85 (08/14 0500)  Labs:  Recent Labs  07/19/13 1915 07/19/13 1925 07/19/13 2140 07/20/13 0349  HGB 9.7*  --   --   --   HCT 30.7*  --   --   --   PLT 413*  --   --   --   APTT  --  28  --   --   LABPROT  --  13.8  --   --   INR  --  1.08  --   --   HEPARINUNFRC  --   --   --  0.12*  CREATININE 1.12  --   --  1.02  TROPONINI  --  <0.30 <0.30 <0.30    Estimated Creatinine Clearance: 80.7 ml/min (by C-G formula based on Cr of 1.02).   Medications:  Infusions:  . heparin      Assessment: Patient with low heparin level.  No issues per RN.  Goal of Therapy:  Heparin level 0.3-0.7 units/ml Monitor platelets by anticoagulation protocol: Yes   Plan:  Increase heparin to 1800 units/hr, reheck level at [address] [place] 07/20/2013,5:49 AM

## 2013-07-20 NOTE — Progress Notes (Addendum)
ANTICOAGULATION CONSULT NOTE - Initial Consult  Pharmacy Consult for Coumadin, IV heparin Indication: acute PE  No Known Allergies  Patient Measurements: Height: 6\' 2"  (188 cm) Weight: 223 lb 1.7 oz (101.2 kg) IBW/kg (Calculated) : 82.2 Heparin Dosing Weight: 100kg  Vital Signs: Temp: 97.8 F (36.6 C) (08/14 0800) Temp src: Oral (08/14 0800) BP: 121/63 mmHg (08/14 0900) Pulse Rate: 89 (08/14 0900)  Labs:  Recent Labs  07/19/13 1915 07/19/13 1925 07/19/13 2140 07/20/13 0349 07/20/13 0550  HGB 9.7*  --   --   --  8.5*  HCT 30.7*  --   --   --  26.8*  PLT 413*  --   --   --  378  APTT  --  28  --   --   --   LABPROT  --  13.8  --   --   --   INR  --  1.08  --   --   --   HEPARINUNFRC  --   --   --  0.12*  --   CREATININE 1.12  --   --  1.02  --   TROPONINI  --  <0.30 <0.30 <0.30  --     Estimated Creatinine Clearance: 80.7 ml/min (by C-G formula based on Cr of 1.02).   Medical History: Past Medical History  Diagnosis Date  . Coronary artery disease     NSTEMI 2001, 2003   . Hypertension   . Diabetes mellitus   . History of MI (myocardial infarction)   . Hyperlipidemia   . CAD (coronary artery disease)   . Syncope, carotid sinus   . Inguinal hernia      Assessment: 72 yom s/p hernia repair 7/24, EGD and exploratory laparotomy with R partial colectomy 7/31 for lower GI hemorrhage. Pt was discharged 8/7. Patient presented 8/13 with c/o SOB, hypoxia. CT angio revealed large PE of main pulmonary artery. IV heparin ordered, GI consulted given risk of bleeding. MD okay to start Coumadin as bridge.   Coumadin score = 6.  Hgb 9.5, plts okay. No bleeding. Incision site looks good per MD notes. Low baseline albumin  IV heparin was started without bolus given GIB and recent surgery.  Patient is currently receiving IV heparin 1800 units/hr.  Heparin level due at 1330 today.     Goal of Therapy:  INR 2-3 Heparin level 0.3-0.7 units/ml Monitor platelets by  anticoagulation protocol: Yes   Plan:   Coumadin 5mg  po x 1 tonight   Coumadin book, video  Pharmacy will f/u with Coumadin education when able  Heparin level this afternoon at 1330  Daily PT/INR, heparin level and CBC  Pharmacy will f/u  Geoffry Paradise, PharmD, BCPS Pager: [phone] 10:42 AM Pharmacy #: [phone]   Addendum:   Heparin level at 1330 returns low at 0.25 (goal 0.3 to 0.5).  Confirmed that IV heparin is running at 1800 units/hr in patient's room. No bleeding/complications.    Plan: Increase IV heparin at 2units/kg/hr to 2000 units/hr.  Heparin level at 10PM tonight. Daily Heparin level. Monitor closely for bleeding and complications.  Geoffry Paradise, PharmD, BCPS Pager: [phone] 2:00 PM Pharmacy #: [phone]

## 2013-07-21 ENCOUNTER — Encounter (INDEPENDENT_AMBULATORY_CARE_PROVIDER_SITE_OTHER): Payer: Medicare Other | Admitting: Surgery

## 2013-07-21 DIAGNOSIS — I517 Cardiomegaly: Secondary | ICD-10-CM

## 2013-07-21 LAB — PROTIME-INR: Prothrombin Time: 14.1 seconds (ref 11.6–15.2)

## 2013-07-21 LAB — BASIC METABOLIC PANEL
BUN: 9 mg/dL (ref 6–23)
Calcium: 8 mg/dL — ABNORMAL LOW (ref 8.4–10.5)
Chloride: 107 mEq/L (ref 96–112)
Creatinine, Ser: 1.03 mg/dL (ref 0.50–1.35)
GFR calc Af Amer: 81 mL/min — ABNORMAL LOW (ref >90)
GFR calc non Af Amer: 69 mL/min — ABNORMAL LOW (ref >90)

## 2013-07-21 LAB — CBC
HCT: 25.3 % — ABNORMAL LOW (ref 39.0–52.0)
MCH: 29.3 pg (ref 26.0–34.0)
MCHC: 32.4 g/dL (ref 30.0–36.0)
MCV: 90.4 fL (ref 78.0–100.0)
Platelets: 389 10*3/uL (ref 150–400)
RDW: 15.1 % (ref 11.5–15.5)

## 2013-07-21 LAB — GLUCOSE, CAPILLARY
Glucose-Capillary: 141 mg/dL — ABNORMAL HIGH (ref 70–99)
Glucose-Capillary: 131 mg/dL — ABNORMAL HIGH (ref 70–99)

## 2013-07-21 MED ORDER — ENOXAPARIN SODIUM 100 MG/ML ~~LOC~~ SOLN
100.0000 mg | Freq: Two times a day (BID) | SUBCUTANEOUS | Status: DC
  Administered 2013-07-21 – 2013-07-22 (×3): 100 mg via SUBCUTANEOUS
  Filled 2013-07-21 (×5): qty 1

## 2013-07-21 MED ORDER — WARFARIN SODIUM 7.5 MG PO TABS
7.5000 mg | ORAL_TABLET | Freq: Once | ORAL | Status: AC
  Administered 2013-07-21: 7.5 mg via ORAL
  Filled 2013-07-21: qty 1

## 2013-07-21 NOTE — Progress Notes (Signed)
*  PRELIMINARY RESULTS* Echocardiogram 2D Echocardiogram has been performed.  Timothy [name] 07/21/2013, 4:07 PM

## 2013-07-21 NOTE — Progress Notes (Signed)
ANTICOAGULATION CONSULT NOTE - Follow Up Consult  Pharmacy Consult for Heparin Indication: acute PE  No Known Allergies  Patient Measurements: Height: 6\' 2"  (188 cm) Weight: 223 lb 1.7 oz (101.2 kg) IBW/kg (Calculated) : 82.2 Heparin Dosing Weight:   Vital Signs: Temp: 98.1 F (36.7 C) (08/14 2225) Temp src: Oral (08/14 2225) BP: 113/61 mmHg (08/14 2225) Pulse Rate: 75 (08/14 2225)  Labs:  Recent Labs  07/19/13 1915  07/19/13 1925 07/19/13 2140 07/20/13 0349 07/20/13 0550 07/20/13 0915 07/20/13 1320 07/20/13 2210  HGB 9.7*  --   --   --   --  8.5*  --   --   --   HCT 30.7*  --   --   --   --  26.8*  --   --   --   PLT 413*  --   --   --   --  378  --   --   --   APTT  --   --  28  --   --   --   --   --   --   LABPROT  --   --  13.8  --   --   --   --   --   --   INR  --   --  1.08  --   --   --   --   --   --   HEPARINUNFRC  --   --   --   --  0.12*  --   --  0.25* 0.28*  CREATININE 1.12  --   --   --  1.02  --   --   --   --   TROPONINI  --   < > <0.30 <0.30 <0.30  --  <0.30  --   --   < > = values in this interval not displayed.  Estimated Creatinine Clearance: 80.7 ml/min (by C-G formula based on Cr of 1.02).   Medications:  Infusions:  . 0.9 % NaCl with KCl 40 mEq / L 50 mL/hr (07/20/13 2307)  . heparin 2,150 Units/hr (07/20/13 2307)    Assessment: Patient with low heparin level again.  No issues per RN.  Goal of Therapy:  Heparin level 0.3-0.7 units/ml Monitor platelets by anticoagulation protocol: Yes   Plan:  Increase heparin to 2150 units/hr, recheck level at 0500  Darlina Guys, Rice Crowford 07/21/2013,1:10 AM

## 2013-07-21 NOTE — Care Management Note (Signed)
Pt uses [facility] on [address]. Cm called to verify pharmacy has lovenox 100mg  syringes for pt possible dc 07/22/13. Pharmacy does not have medication. Cm called [facility] [place]. Pharmacy has generic enoxaparin 100mg  syringes in stock. Pt's copay less than 100.00. Please advise pt to use this pharmacy at dc.   Roxy Manns Lorraine Terriquez,RN,MSN [phone]

## 2013-07-21 NOTE — Progress Notes (Signed)
ANTICOAGULATION CONSULT NOTE - Initial Consult  Pharmacy Consult for Coumadin, IV heparin Indication: acute PE  No Known Allergies  Patient Measurements: Height: 6\' 2"  (188 cm) Weight: 223 lb 1.7 oz (101.2 kg) IBW/kg (Calculated) : 82.2 Heparin Dosing Weight: 100kg  Vital Signs: Temp: 98.5 F (36.9 C) (08/15 0550) Temp src: Oral (08/15 0550) BP: 109/56 mmHg (08/15 0550) Pulse Rate: 71 (08/15 0550)  Labs:  Recent Labs  07/19/13 1915  07/19/13 1925 07/19/13 2140  07/20/13 0349 07/20/13 0550 07/20/13 0915 07/20/13 1320 07/20/13 2210 07/21/13 0345  HGB 9.7*  --   --   --   --   --  8.5*  --   --   --  8.2*  HCT 30.7*  --   --   --   --   --  26.8*  --   --   --  25.3*  PLT 413*  --   --   --   --   --  378  --   --   --  389  APTT  --   --  28  --   --   --   --   --   --   --   --   LABPROT  --   --  13.8  --   --   --   --   --   --   --  14.1  INR  --   --  1.08  --   --   --   --   --   --   --  1.11  HEPARINUNFRC  --   --   --   --   < > 0.12*  --   --  0.25* 0.28* 0.44  CREATININE 1.12  --   --   --   --  1.02  --   --   --   --  1.03  TROPONINI  --   < > <0.30 <0.30  --  <0.30  --  <0.30  --   --   --   < > = values in this interval not displayed.  Estimated Creatinine Clearance: 79.9 ml/min (by C-G formula based on Cr of 1.03).  Medical History: Past Medical History  Diagnosis Date  . Coronary artery disease     NSTEMI 2001, 2003   . Hypertension   . Diabetes mellitus   . History of MI (myocardial infarction)   . Hyperlipidemia   . CAD (coronary artery disease)   . Syncope, carotid sinus   . Inguinal hernia    Assessment: 72 yom s/p hernia repair 7/24, EGD and exploratory laparotomy with R partial colectomy 7/31 for lower GI hemorrhage. Pt was discharged 8/7. Patient presented 8/13 with c/o SOB, hypoxia. CT angio revealed large PE of main pulmonary artery. IV heparin ordered, GI consulted given risk of bleeding. MD okay to start Coumadin as bridge.    Coumadin score = 6.  Hgb 9.5, plts okay. No bleeding. Incision site looks good per MD notes. Low baseline albumin  IV heparin was started 8/13 without bolus given GIB and recent surgery. Required rate increases to achieve therapeutic heparin level  Change to Lovenox dose by pharmacy, discontinue Heparin infusion  Goal of Therapy:  INR 2-3 Anti-Xa level 0.6-1.0; 4hr after Lovenox dose given Monitor platelets by anticoagulation protocol: Yes   Plan:   Day 2/5 overlap Heparin/Lovenox and Warfarin  Coumadin 7.5mg  po x 1 this am   Lovenox  100mg  SQ bid to begin 1 hr after Heparin infusion discontinued this am  Can use Lovenox dosed at 1.5mg /kg q24hr = 150mg  daily  Coumadin book, video  Pharmacy will f/u with Coumadin education   Daily PT/INR, CBC  Otho Bellows PharmD Pager [phone] 07/21/2013, 10:15 AM

## 2013-07-21 NOTE — Care Management Note (Addendum)
Cm spoke with patient at bedside with spouse present concerning discharge planning. Per pt and spouse, spouse able to administer lovenox injections upon discharge. Pt offered resource of HHRN, pt declines at this time. Pt states plans to have PT/INR draws at MD office to manage lovenox/coumadin bridge.Cm called Dr.Elkins office at [phone] to arrange daily PT/INR draws post pt discharge. Pt to discharge home on lovenox. Cm unable to speak to Dr.Elikins RN. Cm left Case manager contact information. Will continue to follow.   Roxy Manns Liston Thum,RN,MSN [phone]

## 2013-07-21 NOTE — Progress Notes (Signed)
[facility] PROGRESS NOTE  Timothy [name] EAV:[id number] DOB: 10-30-1941 DOA: 07/19/2013 PCP: [name], MD  Brief narrative: Timothy [name] is an 72 y.o. male with a PMH of recent right colectomy 07/06/2013 who was admitted on 07/19/2013 with bilateral pulmonary emboli.  Assessment/Plan: Principal Problem:   Bilateral pulmonary embolism -Diagnosis CT angiogram. No evidence of right heart strain on CT scan or 12-lead EKG but will get a 2-D echocardiogram given history of coronary artery disease. -Admitted and placed on a heparin drip. Discussed various options for anticoagulation with him and his wife. Coumadin started 07/20/2013, will switch heparin to Lovenox today in anticipation of discharge home tomorrow. -Monitor closely given recent history of GI bleeding secondary to diverticulosis status post colectomy.  Incision site looks good. Hemoglobin stable. -Notified surgery and gastroenterology of the patient's admission. Active Problems:   Normocytic anemia -Likely secondary to recent blood loss. Monitor closely on heparin. Stable over the last 24 hours.   Hypokalemia -Corrected with potassium added to IV fluids.   HYPERLIPIDEMIA-MIXED -Continue statin therapy.   CAD, NATIVE VESSEL -No current complaints of chest pain.   Diabetes -Continue Januvia and Amaryl. CBGs 89-166.  Code Status: Full. Family Communication: Wife at bedside. Disposition Plan: Home when stable.   Medical Consultants:  Dr. Lodema Pilot, Surgery  Other Consultants:  PT  Anti-infectives:  None.  HPI/Subjective: Timothy [name] continues to feel well with no chest pain, shortness of breath, or other symptoms. He wants to transition to subcutaneous Lovenox in anticipation of discharge. No bloody drainage from incision site.  Objective: Filed Vitals:   07/20/13 1500 07/20/13 1600 07/20/13 2225 07/21/13 0550  BP: 107/50 106/53 113/61 109/56  Pulse: 82 75 75 71  Temp:   98.1 F (36.7  C) 98.5 F (36.9 C)  TempSrc:   Oral Oral  Resp: 17 14 16 16   Height:      Weight:      SpO2: 96% 96% 99% 98%    Intake/Output Summary (Last 24 hours) at 07/21/13 0731 Last data filed at 07/21/13 0600  Gross per 24 hour  Intake 1476.66 ml  Output   2105 ml  Net -628.34 ml    Exam: Gen:  NAD Cardiovascular:  RRR, No M/R/G Respiratory:  Lungs CTAB Gastrointestinal:  Abdomen soft, NT/ND, + BS Extremities:  Trace edema bilaterally  Data Reviewed: Basic Metabolic Panel:  Recent Labs Lab 07/19/13 1915 07/20/13 0349 07/21/13 0345  NA 136 137 139  K 4.0 3.4* 3.7  CL 103 105 107  CO2 24 26 27   GLUCOSE 163* 86 98  BUN 10 9 9   CREATININE 1.12 1.02 1.03  CALCIUM 8.4 8.0* 8.0*   GFR Estimated Creatinine Clearance: 79.9 ml/min (by C-G formula based on Cr of 1.03). Liver Function Tests:  Recent Labs Lab 07/19/13 1915 07/20/13 0349  AST 17 11  ALT 17 13  ALKPHOS 90 79  BILITOT 0.4 0.5  PROT 5.9* 5.3*  ALBUMIN 2.3* 2.0*   Coagulation profile  Recent Labs Lab 07/19/13 1925 07/21/13 0345  INR 1.08 1.11    CBC:  Recent Labs Lab 07/19/13 1915 07/20/13 0550 07/21/13 0345  WBC 8.2 6.7 6.0  NEUTROABS 6.5  --   --   HGB 9.7* 8.5* 8.2*  HCT 30.7* 26.8* 25.3*  MCV 89.8 89.9 90.4  PLT 413* 378 389   Cardiac Enzymes:  Recent Labs Lab 07/19/13 1925 07/19/13 2140 07/20/13 0349 07/20/13 0915  TROPONINI <0.30 <0.30 <0.30 <0.30   CBG:  Recent Labs  Lab 07/19/13 2159 07/20/13 0733 07/20/13 1152 07/20/13 2121 07/21/13 0725  GLUCAP 120* 89 162* 166* 98   Microbiology No results found for this or any previous visit (from the past 240 hour(s)).   Procedures and Diagnostic Studies: Ct Angio Chest Pe W/cm &/or Wo Cm  07/19/2013   *RADIOLOGY REPORT*  Clinical Data: Dizziness, recent abdominal surgery.  Concern for pulmonary embolism.  CT ANGIOGRAPHY CHEST  Technique:  Multidetector CT imaging of the chest using the standard protocol during bolus  administration of intravenous contrast. Multiplanar reconstructed images including MIPs were obtained and reviewed to evaluate the vascular anatomy.  Contrast: OMNIPAQUE IOHEXOL 350 MG/ML SOLN  Comparison: Most recent chest radiograph 07/06/2013, chest CT 09/18/2012  Findings: There is a large volume acute bilateral pulmonary embolism burden involving the left main pulmonary artery and distal segmental branches as well as the right main pulmonary artery and distal segmental branches.  No CT evidence for right heart strain. Heart size is normal.  Trace pericardial fluid.  No lymphadenopathy.  No pleural effusion.  Curvilinear right lower lobe superior segmental opacity likely represents atelectasis or evolving pulmonary infarct.  No mass, nodule, or other consolidation. Central airways are patent.  No acute osseous finding.  IMPRESSION: Large volume bilateral main pulmonary arterial acute emboli without CT evidence for right heart strain.  Superior segment right lower lobe atelectasis or evolving infarct.  Critical Value/emergent results were called by telephone at the time of interpretation on 07/19/2013 at 6:05 p.m. to Nira Retort, PA, who verbally acknowledged these results. . The patient will be brought to the emergency department by CT staff.   Original Report Authenticated By: Christiana Pellant, M.D.   Scheduled Meds: . antiseptic oral rinse  15 mL Mouth Rinse BID  . glimepiride  8 mg Oral QAC breakfast  . insulin aspart  0-15 Units Subcutaneous TID WC  . linagliptin  5 mg Oral Daily  . loratadine  10 mg Oral Daily  . pantoprazole  40 mg Oral Daily  . simvastatin  5 mg Oral q1800  . sodium chloride  3 mL Intravenous Q12H  . Warfarin - Pharmacist Dosing Inpatient   Does not apply q1800   Continuous Infusions: . 0.9 % NaCl with KCl 40 mEq / L 50 mL/hr (07/20/13 2307)  . heparin 2,150 Units/hr (07/20/13 2307)    Time spent: 25 minutes.   LOS: 2 days   Allisson Schindel  [facility] Pager [phone].   *Please note that the hospitalists switch teams on Wednesdays. Please call the flow manager at [phone] if you are having difficulty reaching the hospitalist taking care of this patient as she can update you and provide the most up-to-date pager number of provider caring for the patient. If 8PM-8AM, please contact night-coverage at [email or web address], password [email or web address]  07/21/2013, 7:31 AM

## 2013-07-21 NOTE — Progress Notes (Signed)
ANTICOAGULATION CONSULT NOTE - Follow Up Consult  Pharmacy Consult for Heparin Indication: Acute PE  No Known Allergies  Patient Measurements: Height: 6\' 2"  (188 cm) Weight: 223 lb 1.7 oz (101.2 kg) IBW/kg (Calculated) : 82.2 Heparin Dosing Weight:   Vital Signs: Temp: 98.1 F (36.7 C) (08/14 2225) Temp src: Oral (08/14 2225) BP: 113/61 mmHg (08/14 2225) Pulse Rate: 75 (08/14 2225)  Labs:  Recent Labs  07/19/13 1915  07/19/13 1925 07/19/13 2140  07/20/13 0349 07/20/13 0550 07/20/13 0915 07/20/13 1320 07/20/13 2210 07/21/13 0345  HGB 9.7*  --   --   --   --   --  8.5*  --   --   --  8.2*  HCT 30.7*  --   --   --   --   --  26.8*  --   --   --  25.3*  PLT 413*  --   --   --   --   --  378  --   --   --  389  APTT  --   --  28  --   --   --   --   --   --   --   --   LABPROT  --   --  13.8  --   --   --   --   --   --   --  14.1  INR  --   --  1.08  --   --   --   --   --   --   --  1.11  HEPARINUNFRC  --   --   --   --   < > 0.12*  --   --  0.25* 0.28* 0.44  CREATININE 1.12  --   --   --   --  1.02  --   --   --   --  1.03  TROPONINI  --   < > <0.30 <0.30  --  <0.30  --  <0.30  --   --   --   < > = values in this interval not displayed.  Estimated Creatinine Clearance: 79.9 ml/min (by C-G formula based on Cr of 1.03).   Medications:  Infusions:  . 0.9 % NaCl with KCl 40 mEq / L 50 mL/hr (07/20/13 2307)  . heparin 2,150 Units/hr (07/20/13 2307)    Assessment: Patient with heparin level now at goal.  No issues noted.  Goal of Therapy:  Heparin level 0.3-0.7 units/ml Monitor platelets by anticoagulation protocol: Yes   Plan:  Continue at current rate, recheck level at 1100.  Darlina Guys, Jacquenette Shone Crowford 07/21/2013,5:19 AM

## 2013-07-22 LAB — CBC
Hemoglobin: 8.9 g/dL — ABNORMAL LOW (ref 13.0–17.0)
MCV: 90.7 fL (ref 78.0–100.0)
Platelets: 444 10*3/uL — ABNORMAL HIGH (ref 150–400)
RBC: 3.13 MIL/uL — ABNORMAL LOW (ref 4.22–5.81)
WBC: 6 10*3/uL (ref 4.0–10.5)

## 2013-07-22 LAB — GLUCOSE, CAPILLARY

## 2013-07-22 MED ORDER — ENOXAPARIN SODIUM 100 MG/ML ~~LOC~~ SOLN: 100.0000 mg | Freq: Two times a day (BID) | SUBCUTANEOUS | Status: DC

## 2013-07-22 MED ORDER — WARFARIN SODIUM 5 MG PO TABS: 5.0000 mg | ORAL_TABLET | Freq: Every day | ORAL | Status: DC

## 2013-07-22 NOTE — Discharge Summary (Signed)
Physician Discharge Summary  [name] JXB:[id number] DOB: September 17, 1941 DOA: 07/19/2013  PCP: [name], MD  Admit date: 07/19/2013 Discharge date: 07/22/2013  Recommendations for Outpatient Follow-up:  1. Patient will need close followup with PT/INR check on 07/24/2013 for Coumadin dosage adjustment and then as directed by PCP. Lovenox can be discontinued once the patient's INR is therapeutic x48 hours. 2. Recommend CBC checks weekly while on therapeutic anticoagulation.  Discharge Diagnoses:  Principal Problem:    Bilateral pulmonary embolism Active Problems:    HYPERLIPIDEMIA-MIXED    CAD, NATIVE VESSEL    Diabetes    Normocytic anemia    Hypokalemia  Discharge Condition: Stable.  Diet recommendation: Low-sodium, heart healthy, carbohydrate modified.  History of present illness:  Timothy [name] is an 72 y.o. male with a PMH of recent right colectomy 07/06/2013 who was admitted on 07/19/2013 with bilateral pulmonary emboli.  Hospital Course by problem:  Principal Problem:  Bilateral pulmonary embolism  -Diagnosis made by CT angiogram. No evidence of right heart strain on CT scan or 12-lead EKG; 2-D echocardiogram with poor acoustic windows, but with clinical stability, would not repeat with contrast at this time.  -Recommend therapeutic anticoagulations for at least 3 months. This was a provoked event with recent surgery contributory. -Admitted and placed on a heparin drip. Discussed various options for anticoagulation with him and his wife. Coumadin started 07/20/2013, and switched from IV heparin to subcutaneous Lovenox 07/21/2013. Patient's wife was instructed on how to administer Lovenox and feels comfortable doing this. Active Problems:  Normocytic anemia  -Likely secondary to recent blood loss. Patient's hemoglobin and hematocrit were monitored closely in the hospital and have remained stable over the past 48 hours.  Hypokalemia  -Corrected with potassium  added to IV fluids.  HYPERLIPIDEMIA-MIXED  -Continued on statin therapy.  CAD, NATIVE VESSEL  -No current complaints of chest pain or evidence of compensation while in the hospital.  Diabetes  -Controlled on Januvia and Amaryl.   Procedures:  2-D echocardiogram 07/21/2013: Study Conclusions  - Left ventricle: Very poor acoustic windows limit study. Ovreall LV systolic function aappears depressed however it is not seen well enough to evaluate.further Would order limited scan with echocontrast to evaluate. The cavity size was normal. Wall thickness was increased in a pattern of mild LVH. Doppler parameters are consistent with abnormal left ventricular relaxation (grade 1 diastolic dysfunction). - Right ventricle: RV free wall is not seen well enough to evaluate function.   Consultations:  Dr. Lodema Pilot, Surgery  Discharge Exam: Filed Vitals:   07/22/13 0508  BP: 111/61  Pulse: 73  Temp: 98 F (36.7 C)  Resp: 16   Filed Vitals:   07/21/13 0550 07/21/13 1349 07/21/13 2047 07/22/13 0508  BP: 109/56 105/52 115/60 111/61  Pulse: 71 77 76 73  Temp: 98.5 F (36.9 C) 98.3 F (36.8 C) 98 F (36.7 C) 98 F (36.7 C)  TempSrc: Oral  Oral Oral  Resp: 16 18 16 16   Height:      Weight:      SpO2: 98% 97% 98% 100%    Gen:  NAD Cardiovascular:  RRR, No M/R/G Respiratory: Lungs CTAB Gastrointestinal: Abdomen soft, NT/ND with normal active bowel sounds. Midline abdominal incision with wound edges well approximated, no evidence of infection Extremities: No C/E/C   Discharge Instructions   Future Appointments Provider Department Dept Phone   07/25/2013 12:15 PM [name], MD [facility], [place] [phone]   07/31/2013 9:30 AM [name], MD  [facility], [place] [phone]       Medication List         acetaminophen 500 MG tablet  Commonly known as:  TYLENOL  Take 1,000 mg by mouth every 6 (six) hours as needed for pain.      enoxaparin 100 MG/ML injection  Commonly known as:  LOVENOX  Inject 1 mL (100 mg total) into the skin 2 (two) times daily.     glimepiride 4 MG tablet  Commonly known as:  AMARYL  Take 8 mg by mouth daily before breakfast.     loratadine 10 MG tablet  Commonly known as:  CLARITIN  Take 10 mg by mouth daily.     metFORMIN 1000 MG tablet  Commonly known as:  GLUCOPHAGE  Take 1,000-1,500 mg by mouth 2 (two) times daily. Takes 1 tablet with breakfast and 1.5 tablets with supper.     oxyCODONE-acetaminophen 5-325 MG per tablet  Commonly known as:  PERCOCET/ROXICET  Take 1-2 tablets by mouth every 4 (four) hours as needed for pain.     pantoprazole 40 MG tablet  Commonly known as:  PROTONIX  Take 1 tablet (40 mg total) by mouth daily.     pravastatin 40 MG tablet  Commonly known as:  PRAVACHOL  Take 40 mg by mouth every evening.     sitaGLIPtin 100 MG tablet  Commonly known as:  JANUVIA  Take 100 mg by mouth daily.     warfarin 5 MG tablet  Commonly known as:  COUMADIN  Take 1 tablet (5 mg total) by mouth daily. Take 5 mg 07/22/13 and 07/23/13.  Then as instructed by PCP based on blood test results.           Follow-up Information   Follow up with [name], MD On 07/25/2013. (appointment time: 2pm)    Specialty:  General Surgery   Contact information:   [address] [place] [address] [phone]        The results of significant diagnostics from this hospitalization (including imaging, microbiology, ancillary and laboratory) are listed below for reference.    Significant Diagnostic Studies: Ct Angio Chest Pe W/cm &/or Wo Cm  07/19/2013   *RADIOLOGY REPORT*  Clinical Data: Dizziness, recent abdominal surgery.  Concern for pulmonary embolism.  CT ANGIOGRAPHY CHEST  Technique:  Multidetector CT imaging of the chest using the standard protocol during bolus administration of intravenous contrast. Multiplanar reconstructed images including MIPs  were obtained and reviewed to evaluate the vascular anatomy.  Contrast: OMNIPAQUE IOHEXOL 350 MG/ML SOLN  Comparison: Most recent chest radiograph 07/06/2013, chest CT 09/18/2012  Findings: There is a large volume acute bilateral pulmonary embolism burden involving the left main pulmonary artery and distal segmental branches as well as the right main pulmonary artery and distal segmental branches.  No CT evidence for right heart strain. Heart size is normal.  Trace pericardial fluid.  No lymphadenopathy.  No pleural effusion.  Curvilinear right lower lobe superior segmental opacity likely represents atelectasis or evolving pulmonary infarct.  No mass, nodule, or other consolidation. Central airways are patent.  No acute osseous finding.  IMPRESSION: Large volume bilateral main pulmonary arterial acute emboli without CT evidence for right heart strain.  Superior segment right lower lobe atelectasis or evolving infarct.  Critical Value/emergent results were called by telephone at the time of interpretation on 07/19/2013 at 6:05 p.m. to Nira Retort, PA, who verbally acknowledged these results. . The patient will be brought to  the emergency department by CT staff.   Original Report Authenticated By: Christiana Pellant, M.D.    Labs:  Basic Metabolic Panel:  Recent Labs Lab 07/19/13 1915 07/20/13 0349 07/21/13 0345  NA 136 137 139  K 4.0 3.4* 3.7  CL 103 105 107  CO2 24 26 27   GLUCOSE 163* 86 98  BUN 10 9 9   CREATININE 1.12 1.02 1.03  CALCIUM 8.4 8.0* 8.0*   GFR Estimated Creatinine Clearance: 79.9 ml/min (by C-G formula based on Cr of 1.03). Liver Function Tests:  Recent Labs Lab 07/19/13 1915 07/20/13 0349  AST 17 11  ALT 17 13  ALKPHOS 90 79  BILITOT 0.4 0.5  PROT 5.9* 5.3*  ALBUMIN 2.3* 2.0*   Coagulation profile  Recent Labs Lab 07/19/13 1925 07/21/13 0345 07/22/13 0753  INR 1.08 1.11 1.09    CBC:  Recent Labs Lab 07/19/13 1915 07/20/13 0550 07/21/13 0345  07/22/13 0753  WBC 8.2 6.7 6.0 6.0  NEUTROABS 6.5  --   --   --   HGB 9.7* 8.5* 8.2* 8.9*  HCT 30.7* 26.8* 25.3* 28.4*  MCV 89.8 89.9 90.4 90.7  PLT 413* 378 389 444*   Cardiac Enzymes:  Recent Labs Lab 07/19/13 1925 07/19/13 2140 07/20/13 0349 07/20/13 0915  TROPONINI <0.30 <0.30 <0.30 <0.30   CBG:  Recent Labs Lab 07/21/13 0725 07/21/13 1141 07/21/13 1753 07/21/13 2156 07/22/13 0738  GLUCAP 98 150* 141* 131* 88   Microbiology No results found for this or any previous visit (from the past 240 hour(s)).  Time coordinating discharge: 35 minutes.  Signed:  Jizel Cheeks  Pager [phone] [facility] 07/22/2013, 8:58 AM

## 2013-07-22 NOTE — Progress Notes (Signed)
Pt discharged home; discharge instructions explained to pt and a copy of instructions were given to patient.  Pt verbalized understanding of instructions.

## 2013-07-25 ENCOUNTER — Ambulatory Visit (INDEPENDENT_AMBULATORY_CARE_PROVIDER_SITE_OTHER): Payer: Medicare Other | Admitting: Surgery

## 2013-07-25 ENCOUNTER — Encounter (INDEPENDENT_AMBULATORY_CARE_PROVIDER_SITE_OTHER): Payer: Self-pay | Admitting: Surgery

## 2013-07-25 DIAGNOSIS — Z09 Encounter for follow-up examination after completed treatment for conditions other than malignant neoplasm: Secondary | ICD-10-CM

## 2013-07-25 NOTE — Progress Notes (Signed)
Subjective:     Patient ID: Timothy [name], male   DOB: 1941-07-30, 72 y.o.   MRN: [id number]  HPI He is here for his postop visit status post emergent right partial colectomy for bleeding. The bleeding is from a diverticulum. He had to be readmitted to the hospital postoperatively for a pulmonary embolism. He also had his wound opened up which was closed back with sutures. He is now doing well, eating well, and moving his bowels well.  Review of Systems     Objective:   Physical Exam On exam, his incision is healing well. I removed the sutures and placed Steri-Strips    Assessment:     Patient stable postop    Plan:     He will still refrain from heavy lifting for 4 more weeks. He may then resume his normal activities. He will see me back as needed

## 2013-07-26 ENCOUNTER — Encounter (INDEPENDENT_AMBULATORY_CARE_PROVIDER_SITE_OTHER): Payer: Medicare Other | Admitting: Surgery

## 2013-07-31 ENCOUNTER — Ambulatory Visit (INDEPENDENT_AMBULATORY_CARE_PROVIDER_SITE_OTHER): Payer: Medicare Other | Admitting: Surgery

## 2013-07-31 ENCOUNTER — Encounter (INDEPENDENT_AMBULATORY_CARE_PROVIDER_SITE_OTHER): Payer: Self-pay | Admitting: Surgery

## 2013-07-31 VITALS — BP 126/68 | HR 66 | Resp 14 | Ht 74.0 in | Wt 210.4 lb

## 2013-07-31 DIAGNOSIS — K4090 Unilateral inguinal hernia, without obstruction or gangrene, not specified as recurrent: Secondary | ICD-10-CM

## 2013-07-31 NOTE — Progress Notes (Signed)
General Surgery [facility]  Chief Complaint  Patient presents with  . Routine Post Op    1st po RIH 06/29/2013    HISTORY: Patient is a 72 year old male who underwent right inguinal hernia repair with mesh in late July. During his postoperative recovery he developed a diverticular bleed and required her to laparotomy by my partner, Dr. Carman Ching, performed partial colectomy. Patient has now recovered nicely without further complication.  EXAM: Surgical incisions are nicely healed. Remaining Steri-Strips are removed from his midline abdominal incision. On palpation in the inguinal canal with cough and Valsalva there is no sign of recurrent hernia.  IMPRESSION: #1 status post right inguinal hernia repair with mesh #2 status post exploratory laparotomy and partial colectomy for diverticular bleed  PLAN: Patient will begin applying topical creams to his incisions. He is restricted in his lifting for an additional 2 weeks. He may then resume normal physical activity without restriction. He will return for surgical care as needed.  [name], MD, FACS General & Endocrine Surgery [facility]   Visit Diagnoses: 1. Inguinal hernia unilateral, non-recurrent, right

## 2013-07-31 NOTE — Patient Instructions (Signed)
  COCOA BUTTER & VITAMIN E CREAM  ([employer] or other brand)  Apply cocoa butter/vitamin E cream to your incision 2 - 3 times daily.  Massage cream into incision for one minute with each application.  Use sunscreen (50 SPF or higher) for first 6 months after surgery if area is exposed to sun.  You may substitute Mederma or other scar reducing creams as desired.   

## 2013-08-10 ENCOUNTER — Encounter: Payer: Self-pay | Admitting: Cardiovascular Disease

## 2013-08-14 ENCOUNTER — Encounter (INDEPENDENT_AMBULATORY_CARE_PROVIDER_SITE_OTHER): Payer: Self-pay | Admitting: General Surgery

## 2013-09-27 ENCOUNTER — Telehealth: Payer: Self-pay | Admitting: *Deleted

## 2013-09-27 NOTE — Telephone Encounter (Signed)
Reviewed with Dr. Clifton James and pt will need appt before any recommendations can be made regarding medications.

## 2013-09-27 NOTE — Telephone Encounter (Signed)
Received note in office from pt of medicine changes that have been made following his abdominal surgery on 07/06/13. He is asking if he should resume medications he was previously taking.  I have placed call to pt to get more information. Left message to call back

## 2013-09-28 NOTE — Telephone Encounter (Signed)
Spoke with pt and appt made for him to see Jacolyn Reedy, PA on October 04, 2013 at 9:30.

## 2013-09-28 NOTE — Telephone Encounter (Signed)
Follow up ° ° ° ° ° °Returning Pat's call °

## 2013-10-04 ENCOUNTER — Ambulatory Visit (INDEPENDENT_AMBULATORY_CARE_PROVIDER_SITE_OTHER): Payer: Medicare Other | Admitting: Physician Assistant

## 2013-10-04 ENCOUNTER — Encounter: Payer: Self-pay | Admitting: Physician Assistant

## 2013-10-04 VITALS — BP 110/70 | HR 76 | Ht 74.0 in | Wt 223.0 lb

## 2013-10-04 DIAGNOSIS — Z79899 Other long term (current) drug therapy: Secondary | ICD-10-CM | POA: Insufficient documentation

## 2013-10-04 DIAGNOSIS — I2699 Other pulmonary embolism without acute cor pulmonale: Secondary | ICD-10-CM

## 2013-10-04 DIAGNOSIS — I10 Essential (primary) hypertension: Secondary | ICD-10-CM

## 2013-10-04 NOTE — Assessment & Plan Note (Signed)
Blood pressure is stable resume metoprolol

## 2013-10-04 NOTE — Assessment & Plan Note (Signed)
Patient is here today for medication management. His ramipril, metoprolol, and Plavix were stopped while in the hospital.Patient developed pulmonary embolus in August 2014 after undergoing hernia repair in July 2014 that was complicated by diverticular bleed requiring right partial colectomy. His Plavix had been stopped prior to his hernia repair and has not been resumed. He is now on Coumadin. Patient's blood pressure has been stable. He did have a 2-D echo that shows depressed LV function but was not well visualized. I asked him to resume his metoprolol 25 mg twice a day. He will monitor his blood pressures at home and call us with those results. If they are stable and he is doing well he should resume his ramipril. F/u with Dr. Clifton James in 2 months.

## 2013-10-04 NOTE — Assessment & Plan Note (Signed)
Patient developed pulmonary embolus in August 2014 after undergoing hernia repair in July 2014 that was complicated by diverticular bleed requiring right partial colectomy. His Plavix had been stopped prior to his hernia repair and has not been resumed. He is now on Coumadin. I told the patient he should wait until his Coumadin is stopped before resuming his Plavix because of his risk of bleeding.

## 2013-10-04 NOTE — Progress Notes (Signed)
HPI:  72 yo WM patient of Dr. Clifton James with a history of DM, HTN, HLD and coronary artery disease status post non-ST elevation myocardial infarction in 2001 and 2003.Both times had MI without previous anginal pain. He has been treated with a bare-metal stent to the right coronary artery and Cypher drug-eluting stent to the circumflex. He has normal LV function. Stress myoview February 2011 with LVEF of 55%. No ischemia.    Patient underwent inguinal hernia repair in July of this year that was complicated by a diverticular bleed requiring right partial colectomy. His Plavix was stopped and then he developed a pulmonary embolus in August 2014 for which he was treated with Coumadin. He is here today to review which medications he should be taking since his ramipril, metoprolol and Plavix were all stopped. 2Decho was performed while in the hospital and LV function appears depressed but was not well visualized. Also had grade 1 diastolic dysfunction.  The patient has recovered from all the above. He is feeling well. He denies any chest pain, palpitations, dyspnea, dyspnea on exertion, dizziness, or presyncope. He is back to driving a school bus.      No Known Allergies  Current Outpatient Prescriptions on File Prior to Visit: acetaminophen (TYLENOL) 500 MG tablet, Take 1,000 mg by mouth every 6 (six) hours as needed for pain., Disp: , Rfl:  enoxaparin (LOVENOX) 100 MG/ML injection, Inject 1 mL (100 mg total) into the skin 2 (two) times daily., Disp: 14 Syringe, Rfl: 1 glimepiride (AMARYL) 4 MG tablet, Take 8 mg by mouth daily before breakfast., Disp: , Rfl:  loratadine (CLARITIN) 10 MG tablet, Take 10 mg by mouth daily. , Disp: , Rfl:  metFORMIN (GLUCOPHAGE) 1000 MG tablet, Take 1,000-1,500 mg by mouth 2 (two) times daily. Takes 1 tablet with breakfast and 1.5 tablets with supper., Disp: , Rfl:  pantoprazole (PROTONIX) 40 MG tablet, Take 1 tablet (40 mg total) by mouth daily., Disp: 30 tablet, Rfl:  0 pravastatin (PRAVACHOL) 40 MG tablet, Take 40 mg by mouth every evening., Disp: , Rfl:  sitaGLIPtin (JANUVIA) 100 MG tablet, Take 100 mg by mouth daily., Disp: , Rfl:  warfarin (COUMADIN) 5 MG tablet, Take 1 tablet (5 mg total) by mouth daily. Take 5 mg 07/22/13 and 07/23/13.  Then as instructed by PCP based on blood test results., Disp: 30 tablet, Rfl: 3  No current facility-administered medications on file prior to visit.   Past Medical History:   Coronary artery disease                                        Comment:NSTEMI 2001, 2003    Hypertension                                                 Diabetes mellitus                                            History of MI (myocardial infarction)                        Hyperlipidemia  CAD (coronary artery disease)                                Syncope, carotid sinus                                       Inguinal hernia                                             Past Surgical History:   None                                                          ROTATOR CUFF REPAIR                              2013           Comment:lt   EYE SURGERY                                      2013           Comment:Cataracts-both   COLONOSCOPY                                                   CARDIAC CATHETERIZATION                          00,03          Comment:stents both times   INGUINAL HERNIA REPAIR                          Right 06/29/2013      Comment:Procedure: HERNIA REPAIR INGUINAL ADULT;                Surgeon: [name], MD;  Location: [facility];  Service: General;                Laterality: Right;   INSERTION OF MESH                               Right 06/29/2013      Comment:Procedure: INSERTION OF MESH;  Surgeon: [name], MD;  Location: [facility];  Service: General;  Laterality: Right;   COLONOSCOPY  N/A 07/06/2013      Comment:Procedure: COLONOSCOPY;  Surgeon: [name], MD;  Location: [facility] ENDOSCOPY;                Service: Endoscopy;  Laterality: N/A;                Tattoo,clips,bicap available   ESOPHAGOGASTRODUODENOSCOPY                      N/A 07/06/2013      Comment:Procedure: ESOPHAGOGASTRODUODENOSCOPY (EGD);                Surgeon: [name], MD;  Location: [facility]              ENDOSCOPY;  Service: Endoscopy;  Laterality:               N/A;  at bedside   LAPAROTOMY                                      Right 07/06/2013      Comment:Procedure: EXPLORATORY LAPAROTOMY;  Surgeon:               [name], MD;  Location: [facility];                Service: General;  Laterality: Right;  Review of patient's family history indicates:   Cancer                         Brother                    Comment: colon cancer   Social History   Marital Status: Married             Spouse Name:                      Years of Education:                 Number of children:             Occupational History   None on file  Social History Main Topics   Smoking Status: Never Smoker                     Smokeless Status: Never Used                       Alcohol Use: No             Drug Use: No             Sexual Activity: Not on file        Other Topics            Concern   None on file  Social History Narrative   None on file    ROS: See history of present illness otherwise negative  PHYSICAL EXAM: Well-nournished, in no acute distress. Neck: No JVD, HJR, Bruit, or thyroid enlargement  Lungs: No tachypnea, clear without wheezing, rales, or rhonchi  Cardiovascular: RRR, PMI not displaced, heart sounds normal, no murmurs, gallops, bruit, thrill, or heave.  Abdomen: BS normal. Soft without organomegaly, masses, lesions or tenderness.  Extremities: Edema on the right ankle  otherwise lower extremities without cyanosis, clubbing. Good  distal pulses bilateral  SKin: Warm, no lesions or rashes   Musculoskeletal: No deformities  Neuro: no focal signs  BP 110/70  Pulse 76  Ht 6\' 2"  (1.88 m)  Wt 223 lb (101.152 kg)  BMI 28.62 kg/m2    EKG: Normal sinus rhythm with LVH old septal infarct, no acute change  CT Angio 07/19/13: IMPRESSION: Large volume bilateral main pulmonary arterial acute emboli without CT evidence for right heart strain.  Superior segment right lower lobe atelectasis or evolving infarct.  Critical Value/emergent results were called by telephone at the time of interpretation on 07/19/2013 at 6:05 p.m. to Nira Retort, PA, who verbally acknowledged these results. . The patient will be brought to the emergency department by CT staff.   Original Report Authenticated By: Christiana Pellant, M.D.        Last Resulted: 07/19/13  6:23 PM   2Decho 07/21/13: Study Conclusions  - Left ventricle: Very poor acoustic windows limit study.   Ovreall LV systolic function aappears depressed however it   is not seen well enough to evaluate.further Would order   limited scan with echocontrast to evaluate. The cavity   size was normal. Wall thickness was increased in a pattern   of mild LVH. Doppler parameters are consistent with   abnormal left ventricular relaxation (grade 1 diastolic   dysfunction). - Right ventricle: RV free wall is not seen well enough to   evaluate function.

## 2013-10-04 NOTE — Patient Instructions (Signed)
DO NOT START PLAVIX UNTIL COUMADIN IS DONE  Your physician recommends that you schedule a follow-up appointment in: 2 MONTHS WITH DR. Clifton James  CALL PAT (DR. Clifton James NURSE) TO SEE IF YOU SHOULD START YOUR RAMIPRIL [phone]  PROVIDER WOULD LIKE FOR YOUR TO CHECK YOUR BLOOD PRESSURE DAILY AND BRING IT WITH YOU TO YOUR APPOINTMENT IN 2 MONTHS  Your physician has recommended you make the following change in your medication:   RESTART YOUR METOPROLOL 25 MG TWICE A DAY (TWELVE HOURS APART)

## 2013-10-12 ENCOUNTER — Other Ambulatory Visit: Payer: Self-pay

## 2013-10-25 ENCOUNTER — Other Ambulatory Visit: Payer: Self-pay | Admitting: Cardiovascular Disease

## 2014-01-01 ENCOUNTER — Telehealth: Payer: Self-pay | Admitting: *Deleted

## 2014-01-01 MED ORDER — RAMIPRIL 10 MG PO CAPS: 10.0000 mg | ORAL_CAPSULE | Freq: Every day | ORAL | Status: DC

## 2014-01-01 NOTE — Telephone Encounter (Signed)
Agree, thanks, chris 

## 2014-01-01 NOTE — Telephone Encounter (Signed)
Received fax from pt requesting refill of Ramipril. Also included are blood pressure readings from 10/05/13 - 12/29/13.  Last office visit note reviewed and pt was to call with readings and resume Ramipril 10 mg daily if blood pressure readings stable.  Pt reports blood pressure was stable and he resumed at the end of November. Readings reviewed and most range 120-126/ 68-77.  Will send refills to [facility]. Pt due for follow up with Dr. Clifton JamesMcAlhany. Appt scheduled for January 08, 2014 at 9:45.

## 2014-01-10 ENCOUNTER — Other Ambulatory Visit: Payer: Self-pay | Admitting: Cardiovascular Disease

## 2014-01-18 ENCOUNTER — Ambulatory Visit (INDEPENDENT_AMBULATORY_CARE_PROVIDER_SITE_OTHER): Payer: Medicare Other | Admitting: Cardiovascular Disease

## 2014-01-18 ENCOUNTER — Encounter: Payer: Self-pay | Admitting: Cardiovascular Disease

## 2014-01-18 VITALS — BP 120/82 | HR 64 | Ht 74.0 in | Wt 229.0 lb

## 2014-01-18 DIAGNOSIS — I2510 Atherosclerotic heart disease of native coronary artery without angina pectoris: Secondary | ICD-10-CM

## 2014-01-18 DIAGNOSIS — I10 Essential (primary) hypertension: Secondary | ICD-10-CM

## 2014-01-18 DIAGNOSIS — E785 Hyperlipidemia, unspecified: Secondary | ICD-10-CM

## 2014-01-18 DIAGNOSIS — I2699 Other pulmonary embolism without acute cor pulmonale: Secondary | ICD-10-CM

## 2014-01-18 NOTE — Patient Instructions (Signed)
Your physician wants you to follow-up in:  6 months. You will receive a reminder letter in the mail two months in advance. If you don't receive a letter, please call our office to schedule the follow-up appointment.   

## 2014-01-18 NOTE — Progress Notes (Signed)
History of Present Illness: 73 yo WM with a history of DM, HTN, HLD and coronary artery disease status post non-ST elevation myocardial infarction in 2001 and 2003.Both times had MI without previous anginal pain. He has been treated with a bare-metal stent to the right coronary artery and Cypher drug-eluting stent to the circumflex. He has normal LV function. Stress myoview February 2011 with LVEF of 55%. No ischemia.  Patient underwent inguinal hernia repair in July of 2014 complicated by a diverticular bleed requiring right partial colectomy. His Plavix was stopped and then he developed a pulmonary embolus in August 2014 for which he was treated with Coumadin. He was seen in our office 10/04/13 by Herma CarsonMichelle Lenze, PA-C and was restarted on his cardiac meds.   He returns today for f/u. No chest pain or SOB. He is feeling well. He is very active. He is still driving the school bus.   Primary Care Physician: Windle GuardWilson Elkins  Last Lipid Profile: Followed in primary care.    Past Medical History  Diagnosis Date  . Coronary artery disease     NSTEMI 2001, 2003   . Hypertension   . Diabetes mellitus   . History of MI (myocardial infarction)   . Hyperlipidemia   . CAD (coronary artery disease)   . Syncope, carotid sinus   . Inguinal hernia     Past Surgical History  Procedure Laterality Date  . None    . Rotator cuff repair  2013    lt  . Eye surgery  2013    Cataracts-both  . Colonoscopy    . Cardiac catheterization  00,03    stents both times  . Inguinal hernia repair Right 06/29/2013    Procedure: HERNIA REPAIR INGUINAL ADULT;  Surgeon: Velora Hecklerodd M Gerkin, MD;  Location: [facility];  Service: General;  Laterality: Right;  . Insertion of mesh Right 06/29/2013    Procedure: INSERTION OF MESH;  Surgeon: Velora Hecklerodd M Gerkin, MD;  Location:  [facility];  Service: General;  Laterality: Right;  . Colonoscopy N/A 07/06/2013    Procedure: COLONOSCOPY;  Surgeon: Vertell NovakJames L  Edwards Jr., MD;  [facility];  Service: Endoscopy;  Laterality: N/A;  Tattoo,clips,bicap available  . Esophagogastroduodenoscopy N/A 07/06/2013    Procedure: ESOPHAGOGASTRODUODENOSCOPY (EGD);  Surgeon: Vertell NovakJames L Edwards Jr., MD;  [facility];  Service: Endoscopy;  Laterality: N/A;  at bedside  . Laparotomy Right 07/06/2013    Procedure: EXPLORATORY LAPAROTOMY;  Surgeon: Shelly Rubensteinouglas A Blackman, MD;  Location: [facility];  Service: General;  Laterality: Right;    Current Outpatient Prescriptions  Medication Sig Dispense Refill  . glimepiride (AMARYL) 4 MG tablet Take 8 mg by mouth daily before breakfast.      . loratadine (CLARITIN) 10 MG tablet Take 10 mg by mouth daily as needed for allergies.       . metFORMIN (GLUCOPHAGE) 1000 MG tablet Take 1,000 mg by mouth 2 (two) times daily.       . metoprolol tartrate (LOPRESSOR) 25 MG tablet TAKE ONE TABLET BY MOUTH TWICE DAILY  180 tablet  0  . pravastatin (PRAVACHOL) 40 MG tablet TAKE ONE TABLET BY MOUTH EVERY DAY IN THE EVENING  90 tablet  0  . ramipril (ALTACE) 10 MG capsule Take 1 capsule (10 mg total) by mouth daily.  90 capsule  3  . sitaGLIPtin (JANUVIA) 100 MG tablet Take 100 mg by mouth daily.      Marland Kitchen. warfarin (COUMADIN) 5 MG tablet  5 mg. TAKE AS DIRECTED BY COUMADIN CLINIC DUE TO PT/INR RESULTS       No current facility-administered medications for this visit.    No Known Allergies  History   Social History  . Marital Status: Married    Spouse Name: N/A    Number of Children: N/A  . Years of Education: N/A   Occupational History  . Not on file.   Social History Main Topics  . Smoking status: Never Smoker   . Smokeless tobacco: Never Used  . Alcohol Use: No  . Drug Use: No  . Sexual Activity: Not on file   Other Topics Concern  . Not on file   Social History Narrative  . No narrative on file    Family History  Problem Relation Age of Onset  . Cancer Brother     colon cancer    Review of Systems:  As  stated in the HPI and otherwise negative.   BP 120/82  Pulse 64  Ht 6\' 2"  (1.88 m)  Wt 229 lb (103.874 kg)  BMI 29.39 kg/m2  SpO2 95%  Physical Examination: General: Well developed, well nourished, NAD HEENT: OP clear, mucus membranes moist SKIN: warm, dry. No rashes. Neuro: No focal deficits Musculoskeletal: Muscle strength 5/5 all ext Psychiatric: Mood and affect normal Neck: No JVD, no carotid bruits, no thyromegaly, no lymphadenopathy. Lungs:Clear bilaterally, no wheezes, rhonci, crackles Cardiovascular: Regular rate and rhythm. No murmurs, gallops or rubs. Abdomen:Soft. Bowel sounds present. Non-tender.  Extremities: No lower extremity edema. Pulses are 2 + in the bilateral DP/PT.  Assessment and Plan:   1. CAD: Stable. He is not on Plavix since he is on coumadin. Will restart Plavix when his coumadin is stopped. Continue beta blocker, Ace-inh and statin. He is not on an ASA due to GI upset in the past.   2. HYPERTENSION: BP well controlled. No changes. Continue current meds.   3. Hyperlipidemia: He is on a statin. No changes. Lipids followed in primary care.   4. Pulmonary embolism: Continue coumadin for now. INR is being followed by Dr. Jeannetta Nap. His PE was after his surgery and complications. Would continue for full year before stopping.

## 2014-01-31 ENCOUNTER — Other Ambulatory Visit: Payer: Self-pay | Admitting: Cardiovascular Disease

## 2014-04-11 ENCOUNTER — Other Ambulatory Visit: Payer: Self-pay | Admitting: Cardiovascular Disease

## 2014-05-01 ENCOUNTER — Other Ambulatory Visit: Payer: Self-pay | Admitting: Cardiovascular Disease

## 2014-07-13 ENCOUNTER — Other Ambulatory Visit: Payer: Self-pay | Admitting: Cardiovascular Disease

## 2014-07-31 ENCOUNTER — Telehealth: Payer: Self-pay | Admitting: *Deleted

## 2014-07-31 MED ORDER — CLOPIDOGREL BISULFATE 75 MG PO TABS: 75.0000 mg | ORAL_TABLET | Freq: Every day | ORAL | Status: DC

## 2014-07-31 NOTE — Telephone Encounter (Signed)
Received fax in office from pt informing us as of July 13, 2014 he stopped taking Warfarin. At that time he resumed Clopidogrel 75 mg by mouth daily. Dr. Clifton James aware. Will update med list.  Pt is due for 6 month follow up. Will have scheduling contact him to arrange appt.

## 2014-08-06 ENCOUNTER — Other Ambulatory Visit: Payer: Self-pay | Admitting: Cardiovascular Disease

## 2014-08-07 ENCOUNTER — Other Ambulatory Visit: Payer: Self-pay

## 2014-08-14 ENCOUNTER — Other Ambulatory Visit: Payer: Self-pay | Admitting: Cardiovascular Disease

## 2014-10-29 ENCOUNTER — Encounter: Payer: Self-pay | Admitting: Cardiovascular Disease

## 2014-10-29 ENCOUNTER — Ambulatory Visit (INDEPENDENT_AMBULATORY_CARE_PROVIDER_SITE_OTHER): Payer: Medicare Other | Admitting: Cardiovascular Disease

## 2014-10-29 VITALS — BP 120/66 | HR 60 | Ht 74.0 in | Wt 223.2 lb

## 2014-10-29 DIAGNOSIS — E785 Hyperlipidemia, unspecified: Secondary | ICD-10-CM

## 2014-10-29 DIAGNOSIS — I2510 Atherosclerotic heart disease of native coronary artery without angina pectoris: Secondary | ICD-10-CM

## 2014-10-29 DIAGNOSIS — I10 Essential (primary) hypertension: Secondary | ICD-10-CM

## 2014-10-29 MED ORDER — CLOPIDOGREL BISULFATE 75 MG PO TABS: 75.0000 mg | ORAL_TABLET | Freq: Every day | ORAL | Status: DC

## 2014-10-29 NOTE — Patient Instructions (Signed)
Your physician wants you to follow-up in:  6 months. You will receive a reminder letter in the mail two months in advance. If you don't receive a letter, please call our office to schedule the follow-up appointment.   

## 2014-10-29 NOTE — Progress Notes (Signed)
History of Present Illness: 73 yo WM with a history of DM, HTN, HLD and coronary artery disease status post non-ST elevation myocardial infarction in 2001 and 2003. Both times had MI without previous anginal pain. He has been treated with a bare-metal stent to the right coronary artery and Cypher drug-eluting stent in the circumflex. Stress myoview February 2011 with LVEF of 55%. No ischemia.  Patient underwent inguinal hernia repair in July of 2014 complicated by a diverticular bleed requiring right partial colectomy. His Plavix was stopped and then he developed a pulmonary embolus in August 2014 for which he was treated with Coumadin. He has since completed his coumadin therapy and is back on Plavix.   He returns today for f/u. No chest pain or SOB. He is feeling well. He is very active. 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    Past Medical History  Diagnosis Date  . Coronary artery disease     NSTEMI 2001, 2003   . Hypertension   . Diabetes mellitus   . History of MI (myocardial infarction)   . Hyperlipidemia   . CAD (coronary artery disease)   . Syncope, carotid sinus   . Inguinal hernia     Past Surgical History  Procedure Laterality Date  . None    . Rotator cuff repair  2013    lt  . Eye surgery  2013    Cataracts-both  . Colonoscopy    . Cardiac catheterization  00,03    stents both times  . Inguinal hernia repair Right 06/29/2013    Procedure: HERNIA REPAIR INGUINAL ADULT;  Surgeon: Velora Hecklerodd M Gerkin, MD;  Location: [facility];  Service: General;  Laterality: Right;  . Insertion of mesh Right 06/29/2013    Procedure: INSERTION OF MESH;  Surgeon: Velora Hecklerodd M Gerkin, MD;  Location: [facility];  Service: General;  Laterality: Right;  . Colonoscopy N/A 07/06/2013    Procedure: COLONOSCOPY;  Surgeon: Vertell NovakJames L Edwards Jr., MD;   [facility];  Service: Endoscopy;  Laterality: N/A;  Tattoo,clips,bicap available  . Esophagogastroduodenoscopy N/A 07/06/2013    Procedure: ESOPHAGOGASTRODUODENOSCOPY (EGD);  Surgeon: Vertell NovakJames L Edwards Jr., MD;  [facility];  Service: Endoscopy;  Laterality: N/A;  at bedside  . Laparotomy Right 07/06/2013    Procedure: EXPLORATORY LAPAROTOMY;  Surgeon: Shelly Rubensteinouglas A Blackman, MD;  Location: [facility];  Service: General;  Laterality: Right;    Current Outpatient Prescriptions  Medication Sig Dispense Refill  . clopidogrel (PLAVIX) 75 MG tablet Take 1 tablet (75 mg total) by mouth daily. 30 tablet 11  . glimepiride (AMARYL) 4 MG tablet Take 8 mg by mouth daily before breakfast.    . metFORMIN (GLUCOPHAGE) 1000 MG tablet Take 1,000 mg by mouth 2 (two) times daily with a meal.     . metoprolol tartrate (LOPRESSOR) 25 MG tablet TAKE ONE TABLET BY MOUTH TWICE DAILY 60 tablet 2  . pravastatin (PRAVACHOL) 40 MG tablet TAKE ONE TABLET BY MOUTH ONCE DAILY IN THE EVENING 90 tablet 0  . ramipril (ALTACE) 10 MG capsule Take 1 capsule (10 mg total) by mouth daily. 90 capsule 3  . sitaGLIPtin (JANUVIA) 100 MG tablet Take 100 mg by mouth daily.    Marland Kitchen. loratadine (CLARITIN) 10 MG tablet Take 10 mg by mouth daily as needed for allergies.  No current facility-administered medications for this visit.    No Known Allergies  History   Social History  . Marital Status: Married    Spouse Name: N/A    Number of Children: N/A  . Years of Education: N/A   Occupational History  . Not on file.   Social History Main Topics  . Smoking status: Never Smoker   . Smokeless tobacco: Never Used  . Alcohol Use: No  . Drug Use: No  . Sexual Activity: Not on file   Other Topics Concern  . Not on file   Social History Narrative    Family History  Problem Relation Age of Onset  . Cancer Brother     colon cancer    Review of Systems:  As stated in the HPI and otherwise negative.   BP 120/66  mmHg  Pulse 60  Ht 6\' 2"  (1.88 m)  Wt 223 lb 3.2 oz (101.243 kg)  BMI 28.65 kg/m2  Physical Examination: General: Well developed, well nourished, NAD HEENT: OP clear, mucus membranes moist SKIN: warm, dry. No rashes. Neuro: No focal deficits Musculoskeletal: Muscle strength 5/5 all ext Psychiatric: Mood and affect normal Neck: No JVD, no carotid bruits, no thyromegaly, no lymphadenopathy. Lungs:Clear bilaterally, no wheezes, rhonci, crackles Cardiovascular: Regular rate and rhythm. No murmurs, gallops or rubs. Abdomen:Soft. Bowel sounds present. Non-tender.  Extremities: No lower extremity edema. Pulses are 2 + in the bilateral DP/PT.  EKG: NSR, rate 60 bpm. Incomplete RBBB.   Assessment and Plan:   1. CAD: No chest pains. Continue Plavix, beta blocker, Ace-inh, statin. He is not on an ASA due to GI upset in the past.   2. HYPERTENSION: BP well controlled. No changes. Continue current meds.   3. Hyperlipidemia: He is on a statin. LDL 63 August 2015, see scanned. Continue statin.   4. Pulmonary embolism: He is now off of coumadin. No SOB

## 2014-11-06 ENCOUNTER — Other Ambulatory Visit: Payer: Self-pay | Admitting: Cardiovascular Disease

## 2014-11-13 ENCOUNTER — Other Ambulatory Visit: Payer: Self-pay | Admitting: Cardiovascular Disease

## 2014-12-31 ENCOUNTER — Other Ambulatory Visit: Payer: Self-pay

## 2014-12-31 MED ORDER — SITAGLIPTIN PHOSPHATE 100 MG PO TABS: 100.0000 mg | ORAL_TABLET | Freq: Every day | ORAL | Status: DC

## 2015-01-02 ENCOUNTER — Other Ambulatory Visit: Payer: Self-pay | Admitting: *Deleted

## 2015-01-02 MED ORDER — RAMIPRIL 10 MG PO CAPS: 10.0000 mg | ORAL_CAPSULE | Freq: Every day | ORAL | Status: DC

## 2015-03-02 IMAGING — CR DG CHEST 1V PORT
1 series · 1 of 1 positions shown · non-contrast
Comparison: CTA chest dated 09/18/2012

CLINICAL DATA: Rectal bleeding

PORTABLE CHEST - 1 VIEW

[AP]
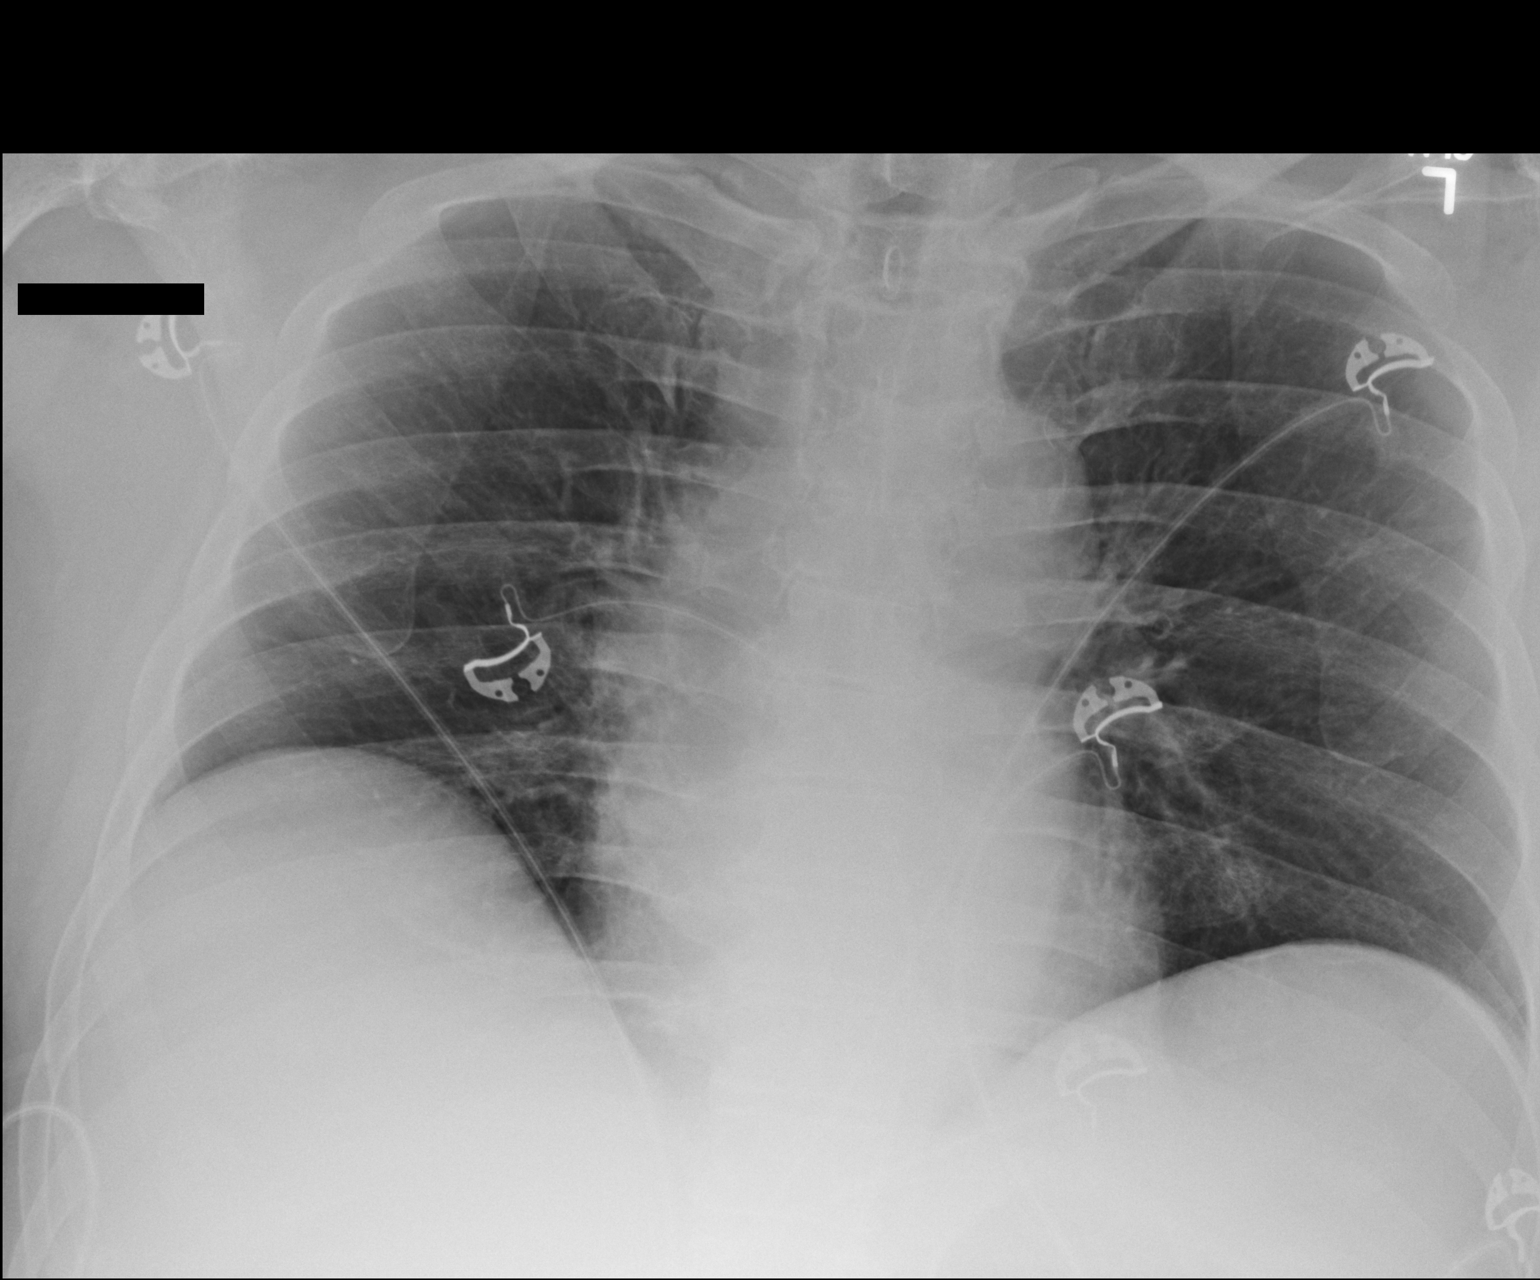

[1 of 1 positions shown; findings below may reference images not displayed]

FINDINGS: Lungs are clear. No pleural effusion or pneumothorax.

The heart is top normal in size.

Mild right perihilar/paramediastinal prominence, likely related to
technique.
IMPRESSION: No evidence of acute cardiopulmonary disease.

Mild right perihilar/paramediastinal prominence, likely related to
technique.  Consider follow-up PA/lateral chest radiographs for
further evaluation.

## 2015-03-06 IMAGING — CT CT CTA ABD/PEL W/CM AND/OR W/O CM
3 of 12 series · 12 of 46 positions shown, 18 images · IV contrast (omnipaque)
Comparison: None.

CLINICAL DATA: GI bleeding, back pain.

CT ANGIOGRAPHY ABDOMEN AND PELVIS
TECHNIQUE: Multidetector CT imaging of the abdomen and pelvis was
performed using the standard protocol during bolus administration
of intravenous contrast.  Multiplanar reconstructed images
including MIPs were obtained and reviewed to evaluate the vascular
anatomy.
Contrast: 100mL OMNIPAQUE IOHEXOL 350 MG/ML SOLN

[Series 4: dissection 2.0 i30f 3 · axial · 0.89mm/px · z∈[-537,-219]mm · 6 of 255 slices shown]
[im 16/255  soft-tissue]
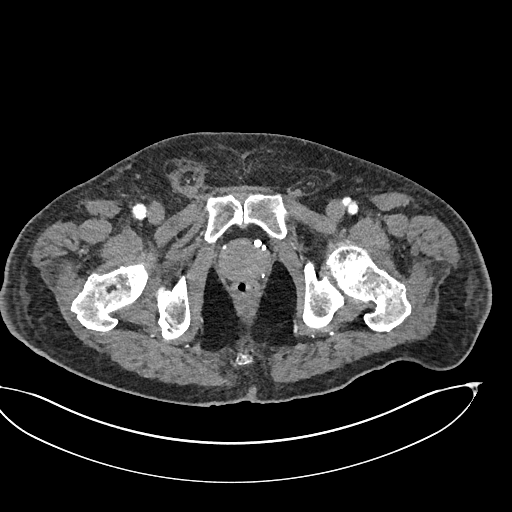
[im 48/255  soft-tissue]
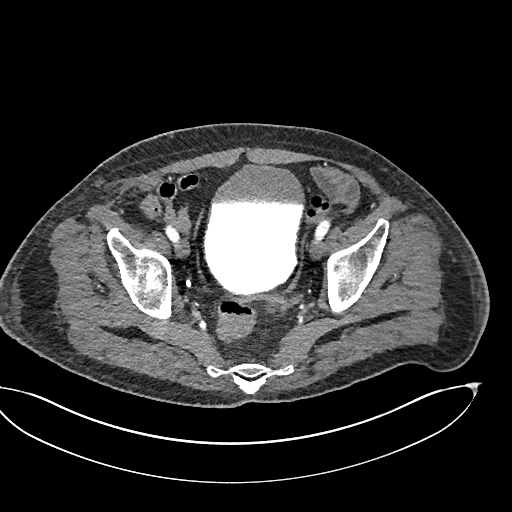
[im 80/255  soft-tissue]
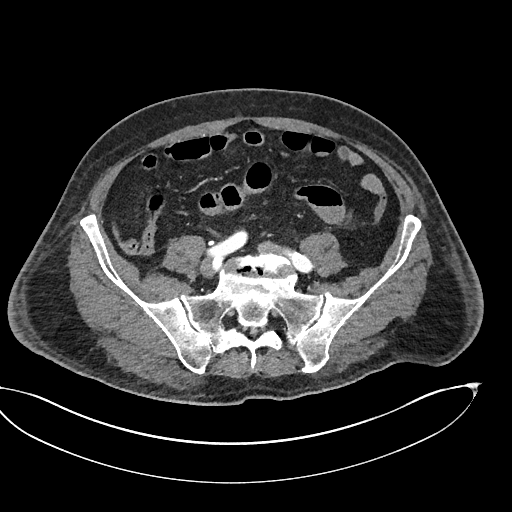
[im 112/255  soft-tissue]
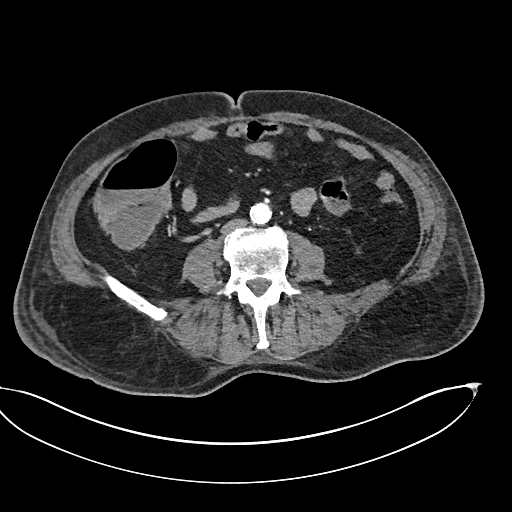
[im 143/255  soft-tissue]
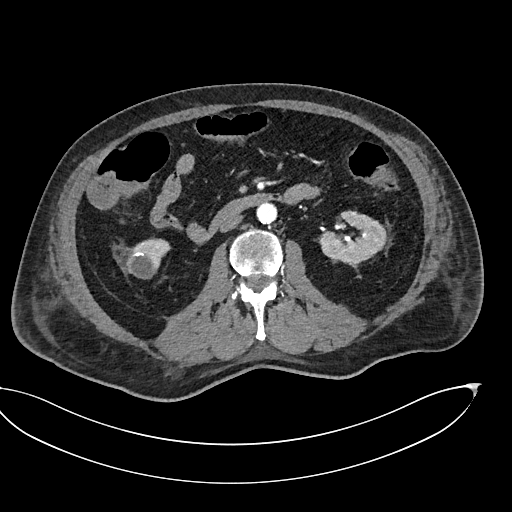
[im 175/255  soft-tissue]
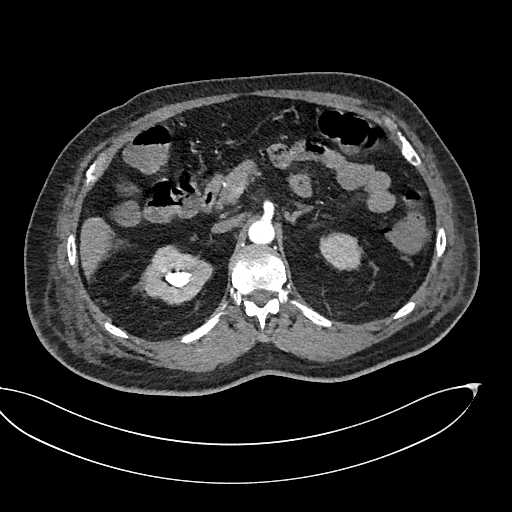

[Series 5: venous 5.0 i30f 3 · axial · portal-venous · 0.89mm/px · z∈[-464,-164]mm · 4 of 102 slices shown, 9 images]
[im 21/102  soft-tissue]
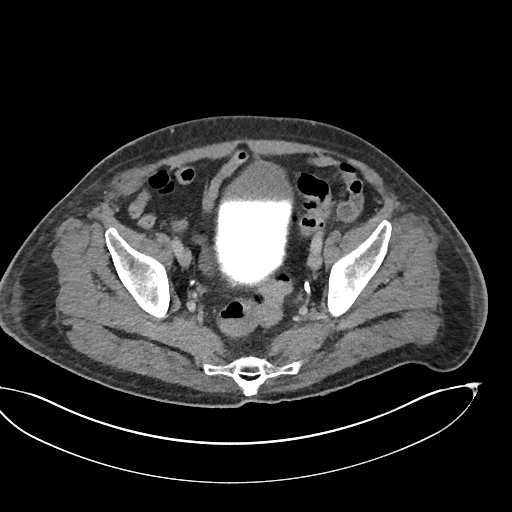
[im 21/102  lung]
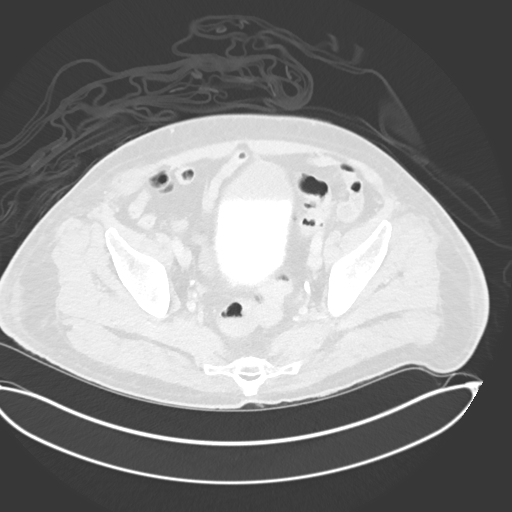
[im 21/102  bone]
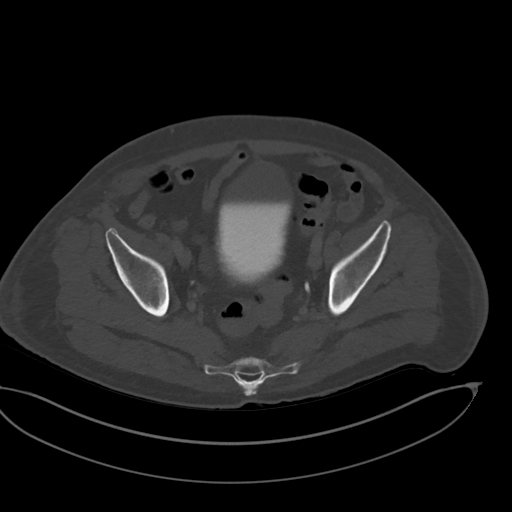
[im 41/102  soft-tissue]
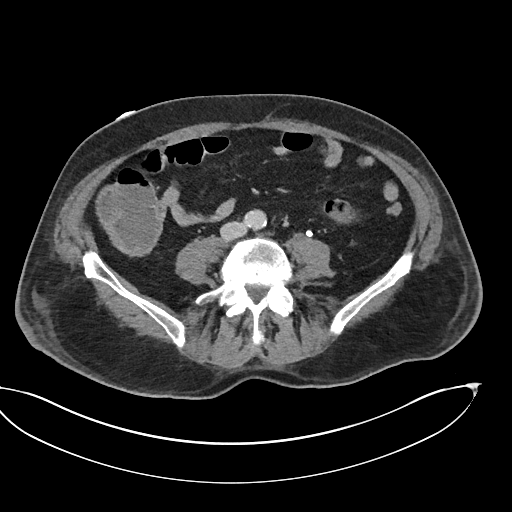
[im 41/102  lung]
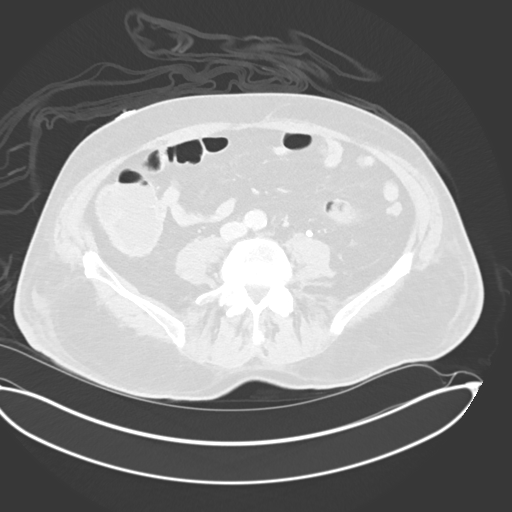
[im 61/102  soft-tissue]
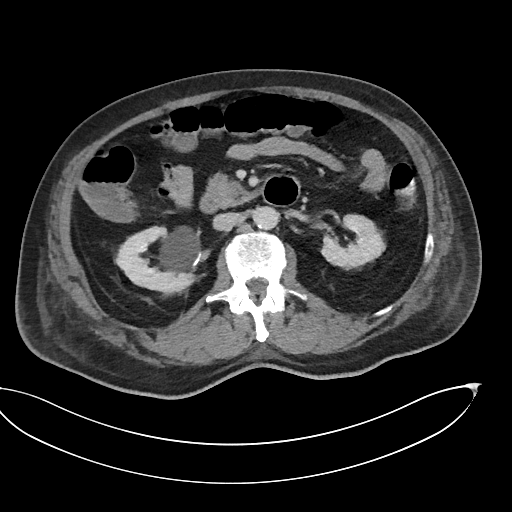
[im 61/102  lung]
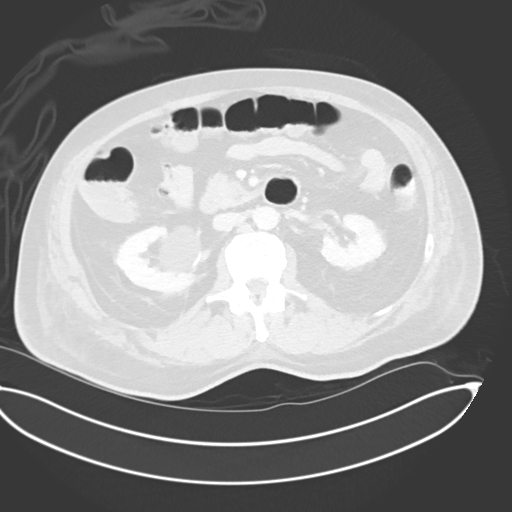
[im 81/102  soft-tissue]
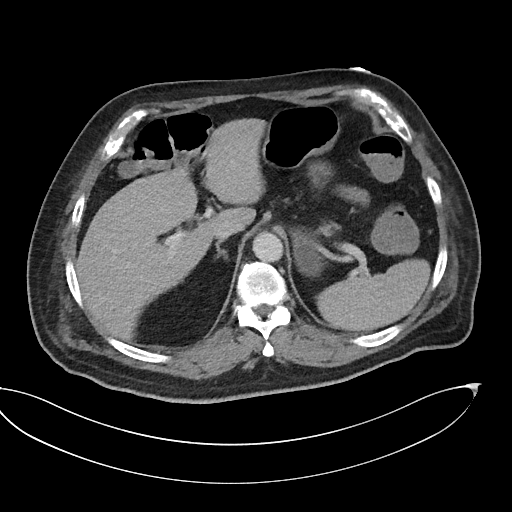
[im 81/102  lung]
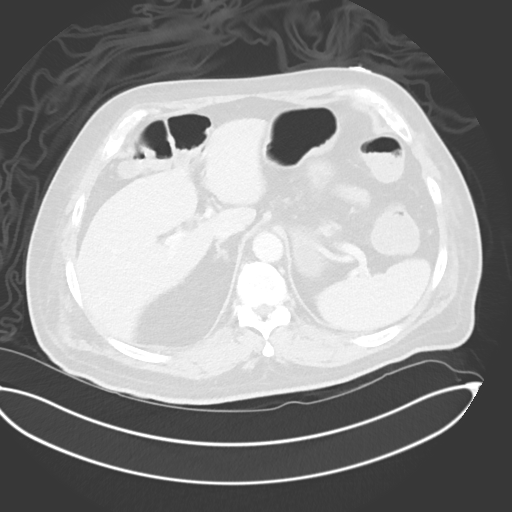

[Series 12: coronal mpr · coronal · 0.81mm/px · 2 of 150 slices shown, 3 images]
[im 50/150  soft-tissue]
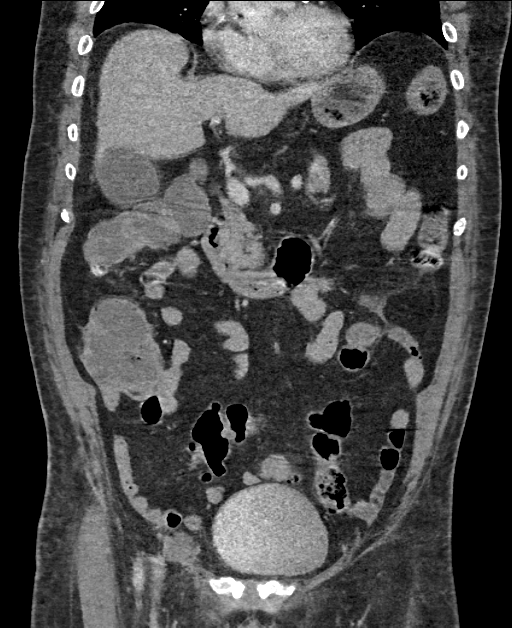
[im 50/150  bone]
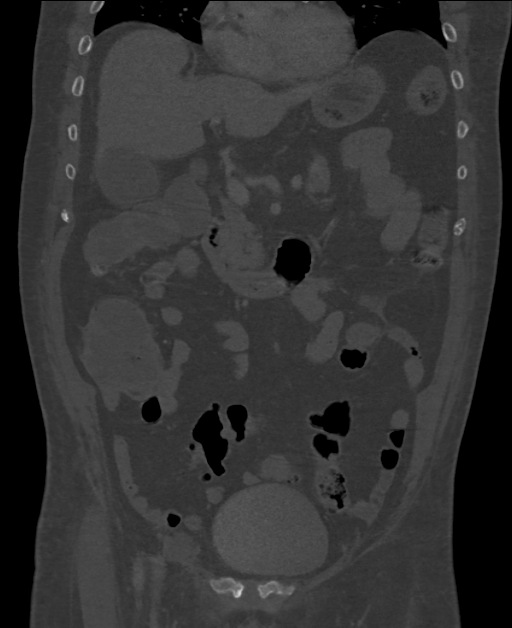
[im 100/150  soft-tissue]
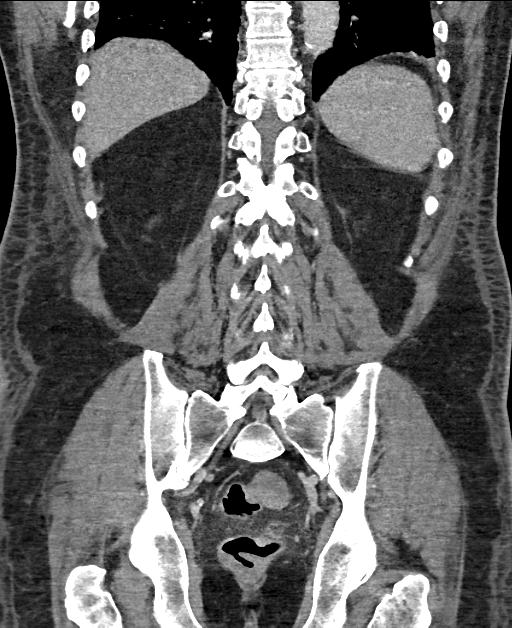

[12 of 46 positions shown; findings below may reference images not displayed]

Arterial findings: Extensive coronary arterial calcifications.
Aorta:                  Patchy atheromatous calcifications
predominately in the infrarenal segment.  No dissection, aneurysm,
or stenosis.

Celiac axis:            Patent, unremarkable

Superior mesenteric:Patent, classic distal branching.

Left renal:             Solitary, with partially calcified ostial
plaque resulting in at least mild stenosis, patent distally.

Right renal:            Solitary, widely patent

Inferior mesenteric:Patent

Left iliac:             Scattered nonocclusive plaque, otherwise
unremarkable.

Right iliac:            Mild scattered nonocclusive plaque,
otherwise unremarkable.

Venous findings:  Patent hepatic veins, portal vein, superior
mesenteric vein, splenic vein, bilateral renal veins, IVC, and
iliac venous system.

 Review of the MIP images confirms the above findings.

Nonvascular findings: Minimal dependent atelectasis in the
visualized lung bases.  Unremarkable liver, gallbladder, spleen,
adrenal glands, left kidney.  4.7 cm right parapelvic renal cyst
and a smaller 2.4 cm exophytic cystic lesion from the right renal
lower pole with some peripheral coarse calcifications.  No
hydronephrosis.  Normal pancreas.  Stomach, small bowel, colon
nondilated.  Normal appendix.  Scattered colonic diverticula most
numerous in the sigmoid segment, without adjacent
inflammatory/edematous change.  Scattered pelvic phleboliths.
Urinary bladder is physiologically distended.  No ascites.  No free
air.  Spondylitic changes in the lower lumbar spine.  No
adenopathy.
IMPRESSION: 1.  Mild aortoiliac atheromatous plaque without significant
visceral or renal artery stenosis or other significant vascular
lesion.
2.  Scattered colonic diverticula.

## 2015-03-07 IMAGING — NM NM GI BLOOD LOSS
1 series · 6 of 6 positions shown · non-contrast
Comparison: CT angiography of the abdomen pelvis and mesenteric
arteriography on 07/05/2013.

CLINICAL DATA: Acute lower GI bleed with colonoscopy today
demonstrating blood and clots in the colon, more prominently at the
level of the hepatic flexure and ascending colon.

NUCLEAR MEDICINE GASTROINTESTINAL BLEEDING STUDY
TECHNIQUE: Sequential abdominal images were obtained following
intravenous administration of Hc-IIm labeled red blood cells.
Radiopharmaceutical: [id number] SBANDA ULTRATAG TECHNETIUM TC 99M-
LABELED RED BLOOD CELLS IV KIT

[Series 0: gi bleed · 4.66mm/px · 6 of 12 frames shown]
[frame 2/12]
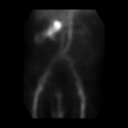
[frame 4/12]
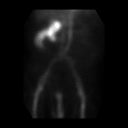
[frame 6/12]
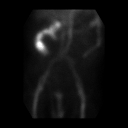
[frame 8/12]
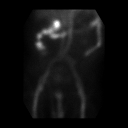
[frame 10/12]
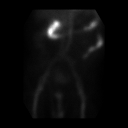
[frame 12/12]
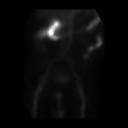

[6 of 6 positions shown; findings below may reference images not displayed]

FINDINGS: After administration of tag red blood cells, there is
immediate positive bleeding identified at the level of the hepatic
flexure of the colon.  Imaging was performed over 1 hour showing
transit of positive colonic activity initially into the ascending
colon and ultimately across the transverse colon and splenic
flexure.  By the end of 1 hour of imaging, blood had reached the
lower descending colon.
IMPRESSION: The bleeding scan is positive for acute bleeding at the level of
the hepatic flexure of the colon.  Findings were communicated to
Dr. Oved during the examination.

## 2015-03-07 IMAGING — CR DG CHEST 1V PORT
1 series · 1 of 1 positions shown · non-contrast
Comparison: 07/05/2013

CLINICAL DATA: Status post right hemicolectomy for lower GI bleed.

PORTABLE CHEST - 1 VIEW

[AP]
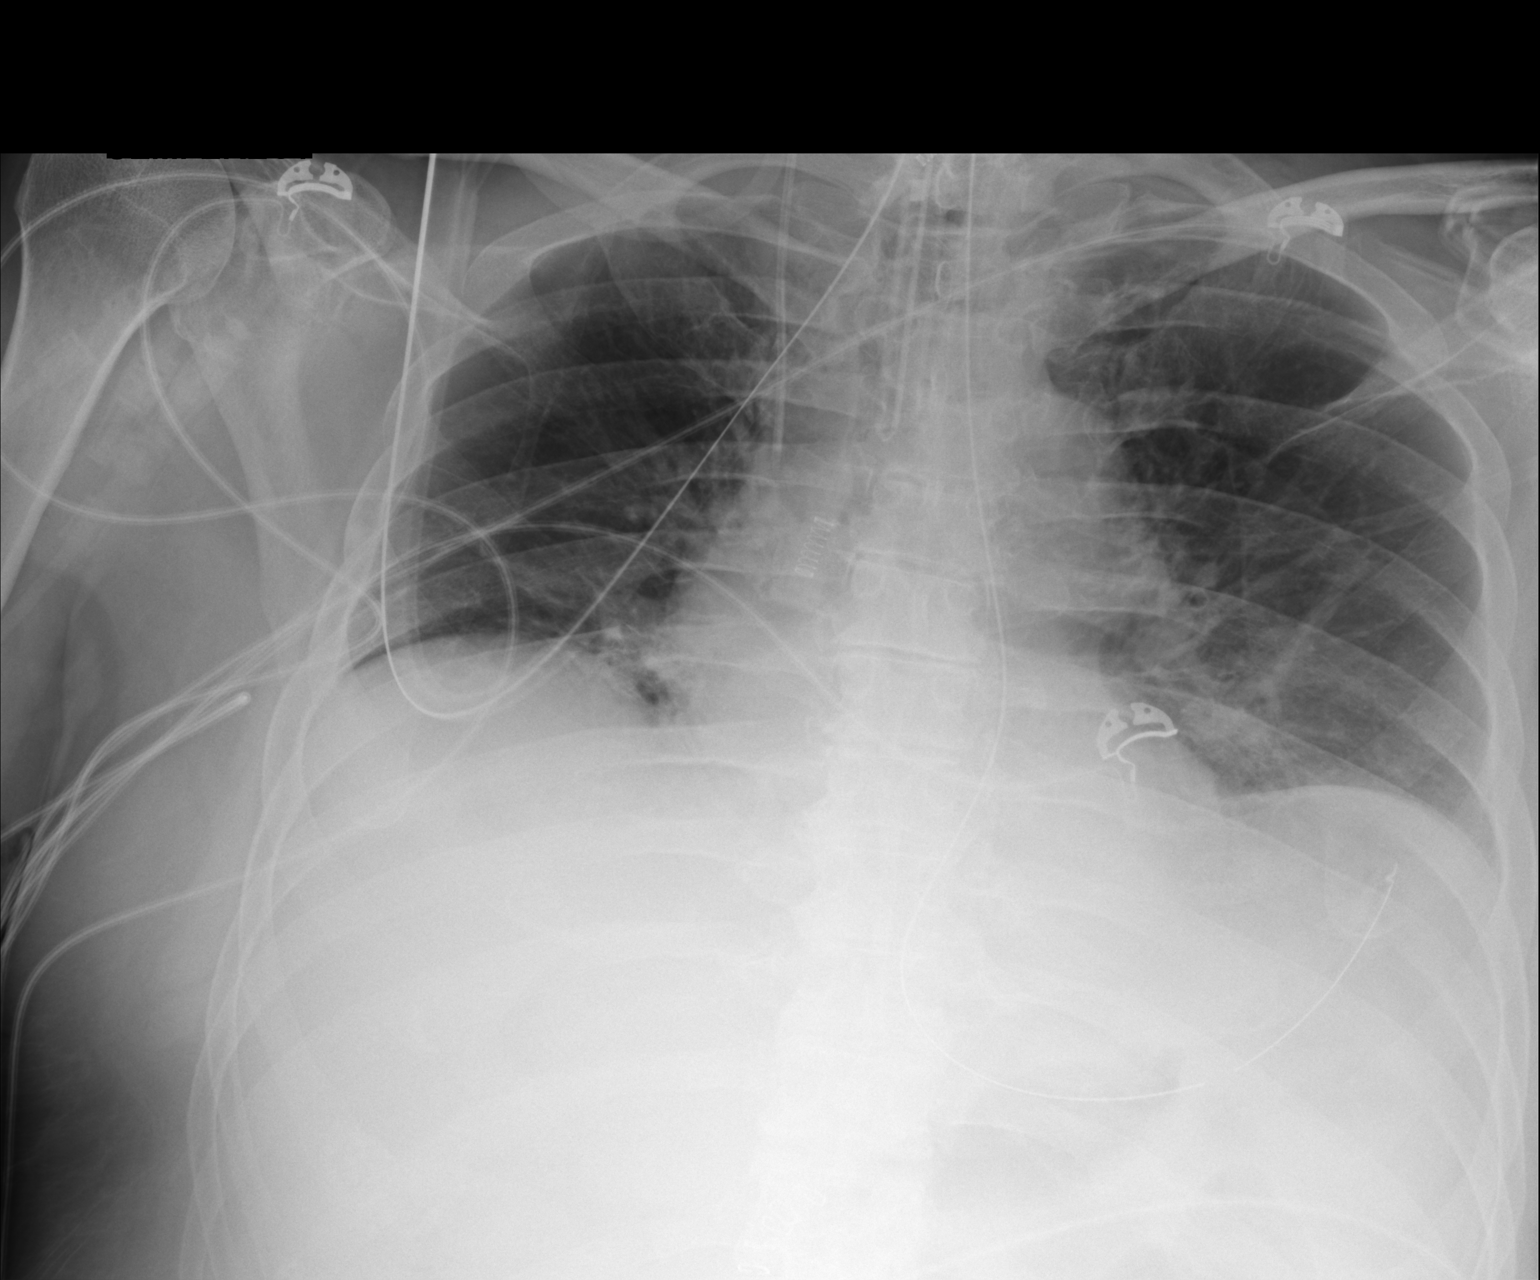

[1 of 1 positions shown; findings below may reference images not displayed]

FINDINGS: Central line positioning stable in the lower SVC.
Endotracheal tube is present with the tip approximately 2 cm above
the carina.  A nasogastric tube extends into the stomach.  Lungs
show bibasilar atelectasis.  No pulmonary edema or focal airspace
consolidation is seen.  The heart size and mediastinal contours are
within normal limits.  No significant pleural effusions are
identified.
IMPRESSION: Endotracheal tube tip is approximately 2 cm above the carina.
Lungs show bibasilar atelectasis.

## 2015-04-02 ENCOUNTER — Other Ambulatory Visit: Payer: Self-pay | Admitting: Cardiovascular Disease

## 2015-05-10 ENCOUNTER — Other Ambulatory Visit: Payer: Self-pay | Admitting: Cardiology

## 2015-05-10 ENCOUNTER — Other Ambulatory Visit: Payer: Self-pay | Admitting: Cardiovascular Disease

## 2015-05-10 NOTE — Telephone Encounter (Signed)
Per note 11.23.15  

## 2015-05-29 NOTE — Progress Notes (Signed)
Chief Complaint  Patient presents with  . Annual Exam    History of Present Illness: 74 yo WM with a history of DM, HTN, HLD and coronary artery disease status post non-ST elevation myocardial infarction in 2001 and 2003. Both times had MI without previous anginal pain. He has been treated with a bare-metal stent to the right coronary artery and Cypher drug-eluting stent in the circumflex. Stress myoview February 2011 with LVEF of 55%. No ischemia.  Patient underwent inguinal hernia repair in July of 2014 complicated by a diverticular bleed requiring right partial colectomy. His Plavix was stopped and then he developed a pulmonary embolus in August 2014 for which he was treated with Coumadin. He has since completed his coumadin therapy and is back on Plavix.   He returns today for f/u. No chest pain or SOB. He is feeling well. He is very active. He is still driving the school bus.  Primary Care Physician: Windle Guard  Last Lipid Profile: Followed in primary care.    Past Medical History  Diagnosis Date  . Coronary artery disease     NSTEMI 2001, 2003   . Hypertension   . Diabetes mellitus   . History of MI (myocardial infarction)   . Hyperlipidemia   . CAD (coronary artery disease)   . Syncope, carotid sinus   . Inguinal hernia     Past Surgical History  Procedure Laterality Date  . None    . Rotator cuff repair  2013    lt  . Eye surgery  2013    Cataracts-both  . Colonoscopy    . Cardiac catheterization  00,03    stents both times  . Inguinal hernia repair Right 06/29/2013    Procedure: HERNIA REPAIR INGUINAL ADULT;  Surgeon: [name], MD;  Location: [facility];  Service: General;  Laterality: Right;  . Insertion of mesh Right 06/29/2013    Procedure: INSERTION OF MESH;  Surgeon: [name], MD;  Location: [facility];  Service: General;  Laterality: Right;  . Colonoscopy N/A 07/06/2013    Procedure: COLONOSCOPY;  Surgeon: [name], MD;  Location: [facility] ENDOSCOPY;  Service: Endoscopy;  Laterality: N/A;  Tattoo,clips,bicap available  . Esophagogastroduodenoscopy N/A 07/06/2013    Procedure: ESOPHAGOGASTRODUODENOSCOPY (EGD);  Surgeon: [name], MD;  Location: [facility];  Service: Endoscopy;  Laterality: N/A;  at bedside  . Laparotomy Right 07/06/2013    Procedure: EXPLORATORY LAPAROTOMY;  Surgeon: [name], MD;  Location: [facility] OR;  Service: General;  Laterality: Right;    Current Outpatient Prescriptions  Medication Sig Dispense Refill  . clopidogrel (PLAVIX) 75 MG tablet Take 1 tablet (75 mg total) by mouth daily. 90 tablet 3  . glimepiride (AMARYL) 4 MG tablet Take 8 mg by mouth daily before breakfast.    . loratadine (CLARITIN) 10 MG tablet Take 10 mg by mouth daily as needed for allergies.     . metFORMIN (GLUCOPHAGE) 1000 MG tablet Take 1,000 mg by mouth 2 (two) times daily with a meal.     . metoprolol tartrate (LOPRESSOR) 25 MG tablet TAKE ONE TABLET BY MOUTH TWICE DAILY 60 tablet 6  . pravastatin (PRAVACHOL) 40 MG tablet TAKE ONE TABLET BY MOUTH IN THE EVENING 90 tablet 1  . ramipril (ALTACE) 10 MG capsule TAKE ONE CAPSULE BY MOUTH ONCE DAILY 90 capsule 0   No current facility-administered medications for this visit.    No Known Allergies  History  Social History  . Marital Status: Married    Spouse Name: N/A  . Number of Children: N/A  . Years of Education: N/A   Occupational History  . Not on file.   Social History Main Topics  . Smoking status: Never Smoker   . Smokeless tobacco: Never Used  . Alcohol Use: No  . Drug Use: No  . Sexual Activity: Not on file   Other Topics Concern  . Not on file   Social History Narrative    Family History  Problem Relation Age of Onset  . Cancer Brother     colon cancer    Review of Systems:  As stated in the HPI and otherwise negative.   BP 132/64 mmHg  Pulse 69  Ht  (1.88 m)  Wt 214 lb 6.4 oz (97.251 kg)  BMI  27.52 kg/m2  Physical Examination: General: Well developed, well nourished, NAD HEENT: OP clear, mucus membranes moist SKIN: warm, dry. No rashes. Neuro: No focal deficits Musculoskeletal: Muscle strength 5/5 all ext Psychiatric: Mood and affect normal Neck: No JVD, no carotid bruits, no thyromegaly, no lymphadenopathy. Lungs:Clear bilaterally, no wheezes, rhonci, crackles Cardiovascular: Regular rate and rhythm. No murmurs, gallops or rubs. Abdomen:Soft. Bowel sounds present. Non-tender.  Extremities: No lower extremity edema. Pulses are 2 + in the bilateral DP/PT.  EKG:  EKG is not ordered today. The ekg ordered today demonstrates   Recent Labs: No results found for requested labs within last 365 days.   Lipid Panel    Component Value Date/Time   CHOL 106 01/23/2009 0918   TRIG 98 01/23/2009 0918   HDL 31.7* 01/23/2009 0918   CHOLHDL 3.3 CALC 01/23/2009 0918   VLDL 20 01/23/2009 0918   LDLCALC 55 01/23/2009 0918     Wt Readings from Last 3 Encounters:  05/30/15 214 lb 6.4 oz (97.251 kg)  10/29/14 223 lb 3.2 oz (101.243 kg)  01/18/14 229 lb (103.874 kg)     Other studies Reviewed: Additional studies/ records that were reviewed today include: . Review of the above records demonstrates:   Assessment and Plan:   1. CAD: No chest pains. Continue Plavix, beta blocker, Ace-inh, statin. He is not on an ASA due to GI upset in the past.   2. HYPERTENSION: BP well controlled. No changes. Continue current meds.   3. Hyperlipidemia: He is on a statin. Lipids followed in primary care.   4. Pulmonary embolism: He is now off of coumadin. No SOB  Current medicines are reviewed at length with the patient today.  The patient does not have concerns regarding medicines.  The following changes have been made:  no change  Labs/ tests ordered today include:  No orders of the defined types were placed in this encounter.    Disposition:   FU with me in 12   months  Signed, Verne Carrow, MD 05/30/2015 8:49 AM    [facility] [address] [place], [place], [place]  [address] Phone: [phone]; Fax: [phone]

## 2015-05-30 ENCOUNTER — Encounter: Payer: Self-pay | Admitting: Cardiovascular Disease

## 2015-05-30 ENCOUNTER — Ambulatory Visit (INDEPENDENT_AMBULATORY_CARE_PROVIDER_SITE_OTHER): Payer: Medicare Other | Admitting: Cardiovascular Disease

## 2015-05-30 VITALS — BP 132/64 | HR 69 | Ht 74.0 in | Wt 214.4 lb

## 2015-05-30 DIAGNOSIS — I2510 Atherosclerotic heart disease of native coronary artery without angina pectoris: Secondary | ICD-10-CM | POA: Diagnosis not present

## 2015-05-30 DIAGNOSIS — I10 Essential (primary) hypertension: Secondary | ICD-10-CM

## 2015-05-30 DIAGNOSIS — E785 Hyperlipidemia, unspecified: Secondary | ICD-10-CM | POA: Diagnosis not present

## 2015-05-30 NOTE — Patient Instructions (Signed)
Medication Instructions:  Your physician recommends that you continue on your current medications as directed. Please refer to the Current Medication list given to you today.   Labwork: none  Testing/Procedures: none  Follow-Up: Your physician wants you to follow-up in:  12 months.  You will receive a reminder letter in the mail two months in advance. If you don't receive a letter, please call our office to schedule the follow-up appointment.        

## 2015-07-01 ENCOUNTER — Other Ambulatory Visit: Payer: Self-pay | Admitting: Cardiovascular Disease

## 2015-07-19 ENCOUNTER — Other Ambulatory Visit: Payer: Self-pay | Admitting: *Deleted

## 2015-07-19 MED ORDER — CLOPIDOGREL BISULFATE 75 MG PO TABS: 75.0000 mg | ORAL_TABLET | Freq: Every day | ORAL | Status: DC

## 2015-07-19 MED ORDER — METOPROLOL TARTRATE 25 MG PO TABS: 25.0000 mg | ORAL_TABLET | Freq: Two times a day (BID) | ORAL | Status: DC

## 2015-07-25 ENCOUNTER — Other Ambulatory Visit: Payer: Self-pay

## 2015-07-25 MED ORDER — PRAVASTATIN SODIUM 40 MG PO TABS: 40.0000 mg | ORAL_TABLET | Freq: Every evening | ORAL | Status: DC

## 2015-10-16 ENCOUNTER — Other Ambulatory Visit: Payer: Self-pay

## 2015-10-16 MED ORDER — RAMIPRIL 10 MG PO CAPS: ORAL_CAPSULE | ORAL | Status: DC

## 2015-10-16 NOTE — Telephone Encounter (Signed)
Kathleene Hazelhristopher D McAlhany, MD at 05/29/2015 6:40 PM  ramipril (ALTACE) 10 MG capsuleTAKE ONE CAPSULE BY MOUTH ONCE DAILY Current medicines are reviewed at length with the patient today. The patient does not have concerns regarding medicines.  The following changes have been made: no change

## 2015-12-03 ENCOUNTER — Other Ambulatory Visit: Payer: Self-pay | Admitting: Cardiovascular Disease

## 2016-02-12 ENCOUNTER — Encounter: Payer: Self-pay | Admitting: Cardiovascular Disease

## 2016-02-19 ENCOUNTER — Other Ambulatory Visit: Payer: Self-pay | Admitting: Cardiovascular Disease

## 2016-03-27 ENCOUNTER — Other Ambulatory Visit: Payer: Self-pay

## 2016-03-27 MED ORDER — CLOPIDOGREL BISULFATE 75 MG PO TABS: ORAL_TABLET | ORAL | Status: DC

## 2016-03-27 MED ORDER — RAMIPRIL 10 MG PO CAPS: ORAL_CAPSULE | ORAL | Status: DC

## 2016-03-27 MED ORDER — PRAVASTATIN SODIUM 40 MG PO TABS: ORAL_TABLET | ORAL | Status: DC

## 2016-05-11 ENCOUNTER — Other Ambulatory Visit: Payer: Self-pay | Admitting: Cardiovascular Disease

## 2016-05-15 ENCOUNTER — Encounter: Payer: Self-pay | Admitting: Cardiovascular Disease

## 2016-07-20 ENCOUNTER — Other Ambulatory Visit: Payer: Self-pay | Admitting: Cardiovascular Disease

## 2016-08-18 ENCOUNTER — Encounter: Payer: Self-pay | Admitting: Cardiovascular Disease

## 2016-08-31 ENCOUNTER — Other Ambulatory Visit: Payer: Self-pay | Admitting: Cardiovascular Disease

## 2016-09-27 NOTE — Progress Notes (Signed)
Chief Complaint  Patient presents with  . Follow-up   History of Present Illness: 75 yo WM with a history of DM, HTN, HLD, coronary artery disease status post non-ST elevation myocardial infarction in 2001 and 2003 and PE in 2014 who is here today for cardiac follow up. Cardiac history includes bare-metal stent placement in the RCA in 2001 and Cypher drug-eluting stent placement in the circumflex artery in 2003. Stress myoview February 2011 with LVEF of 55%. No ischemia.  Patient underwent inguinal hernia repair in July of 2014 complicated by a diverticular bleed requiring right partial colectomy. His Plavix was stopped and then he developed a pulmonary embolus in August 2014 for which he was treated with Coumadin. He has since completed his coumadin therapy and is back on Plavix.   He returns today for f/u. No chest pain or SOB. He is feeling well. He is very active. 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   Primary Care Physician: Kaleen MaskELKINS,WILSON OLIVER, MD  Past Medical History:  Diagnosis Date  . CAD (coronary artery disease)   . Coronary artery disease    NSTEMI 2001, 2003   . Diabetes mellitus   . History of MI (myocardial infarction)   . Hyperlipidemia   . Hypertension   . Inguinal hernia   . Syncope, carotid sinus     Past Surgical History:  Procedure Laterality Date  . CARDIAC CATHETERIZATION  00,03   stents both times  . COLONOSCOPY    . COLONOSCOPY N/A 07/06/2013   Procedure: COLONOSCOPY;  Surgeon: Vertell NovakJames L Edwards Jr., MD;  [facility];  Service: Endoscopy;  Laterality: N/A;  Tattoo,clips,bicap available  . ESOPHAGOGASTRODUODENOSCOPY N/A 07/06/2013   Procedure: ESOPHAGOGASTRODUODENOSCOPY (EGD);  Surgeon: Vertell NovakJames L Edwards Jr., MD;  [facility];  Service: Endoscopy;  Laterality: N/A;  at bedside  . EYE SURGERY  2013   Cataracts-both  . INGUINAL HERNIA REPAIR Right 06/29/2013   Procedure: HERNIA REPAIR INGUINAL ADULT;   Surgeon: Velora Hecklerodd M Gerkin, MD;  Location: [facility];  Service: General;  Laterality: Right;  . INSERTION OF MESH Right 06/29/2013   Procedure: INSERTION OF MESH;  Surgeon: Velora Hecklerodd M Gerkin, MD;  Location: [facility];  Service: General;  Laterality: Right;  . LAPAROTOMY Right 07/06/2013   Procedure: EXPLORATORY LAPAROTOMY;  Surgeon: Shelly Rubensteinouglas A Blackman, MD;  Location: [facility] OR;  Service: General;  Laterality: Right;  . None    . ROTATOR CUFF REPAIR  2013   lt    Current Outpatient Prescriptions  Medication Sig Dispense Refill  . BYDUREON 2 MG PEN Inject 1 Dose as directed once a week.    . clopidogrel (PLAVIX) 75 MG tablet Take 1 tablet by mouth daily - Please keep scheduled 09/28/16 appt for further refills 75 tablet 0  . glimepiride (AMARYL) 4 MG tablet Take 8 mg by mouth daily before breakfast.    . loratadine (CLARITIN) 10 MG tablet Take 10 mg by mouth daily as needed for allergies.     . metFORMIN (GLUCOPHAGE) 1000 MG tablet Take 1,000 mg by mouth 2 (two) times daily with a meal.     . metoprolol tartrate (LOPRESSOR) 25 MG tablet Take 1 tablet by mouth twice daily - please keep scheduled 09/28/16 appt for further refills 150 tablet 0  . pravastatin (PRAVACHOL) 40 MG tablet Take 1 tablet by mouth every evening - Please keep scheduled 09/28/16 appt for further refills 75 tablet 0  . PRESCRIPTION  MEDICATION Glucocil Food Supplement. 2 at lunch, 2 at dinner    . ramipril (ALTACE) 10 MG capsule Take 1 capsule by mouth daily - Please keep scheduled 09/28/16 appt for further refills 75 capsule 0   No current facility-administered medications for this visit.     No Known Allergies  Social History   Social History  . Marital status: Married    Spouse name: N/A  . Number of children: N/A  . Years of education: N/A   Occupational History  . Not on file.   Social History Main Topics  . Smoking status: Never Smoker  . Smokeless tobacco: Never Used  . Alcohol use No    . Drug use: No  . Sexual activity: Not on file   Other Topics Concern  . Not on file   Social History Narrative  . No narrative on file    Family History  Problem Relation Age of Onset  . Cancer Brother     colon cancer    Review of Systems:  As stated in the HPI and otherwise negative.   BP 128/66   Pulse 93   Ht 6\' 2"  (1.88 m)   Wt 221 lb (100.2 kg)   BMI 28.37 kg/m   Physical Examination: General: Well developed, well nourished, NAD  HEENT: OP clear, mucus membranes moist  SKIN: warm, dry. No rashes. Neuro: No focal deficits  Musculoskeletal: Muscle strength 5/5 all ext  Psychiatric: Mood and affect normal  Neck: No JVD, no carotid bruits, no thyromegaly, no lymphadenopathy.  Lungs:Clear bilaterally, no wheezes, rhonci, crackles Cardiovascular: Regular rate and rhythm. No murmurs, gallops or rubs. Abdomen:Soft. Bowel sounds present. Non-tender.  Extremities: No lower extremity edema. Pulses are 2 + in the bilateral DP/PT.  EKG:  EKG is ordered today. The ekg ordered today demonstrates NSR, rate 93 bpm. LVH. Poor R wave progression precordial leads.   Recent Labs: No results found for requested labs within last 8760 hours.   Lipid Panel Followed in primary care   Wt Readings from Last 3 Encounters:  09/28/16 221 lb (100.2 kg)  05/30/15 214 lb 6.4 oz (97.3 kg)  10/29/14 223 lb 3.2 oz (101.2 kg)     Other studies Reviewed: Additional studies/ records that were reviewed today include: . Review of the above records demonstrates:   Assessment and Plan:   1. CAD: He has no chest pain suggestive of angina. Continue Plavix, beta blocker, Ace-inh, statin. He is not on an ASA due to GI upset in the past.   2. HYPERTENSION: BP well controlled. No changes. Continue current meds.   3. Hyperlipidemia: He is on a statin. Lipids followed in primary care.   4. History of Pulmonary embolism: This was following a GI surgery in 2014. He is now off of coumadin.    Current medicines are reviewed at length with the patient today.  The patient does not have concerns regarding medicines.  The following changes have been made:  no change  Labs/ tests ordered today include:   Orders Placed This Encounter  Procedures  . EKG 12-Lead    Disposition:   FU with me in 12  months  Signed, Verne Carrow, MD 09/28/2016 8:50 AM    [facility] [address] [place], [place], [place]  [address] Phone: [phone]; Fax: [phone]

## 2016-09-28 ENCOUNTER — Encounter: Payer: Self-pay | Admitting: Cardiovascular Disease

## 2016-09-28 ENCOUNTER — Ambulatory Visit (INDEPENDENT_AMBULATORY_CARE_PROVIDER_SITE_OTHER): Payer: Medicare Other | Admitting: Cardiovascular Disease

## 2016-09-28 VITALS — BP 128/66 | HR 93 | Ht 74.0 in | Wt 221.0 lb

## 2016-09-28 DIAGNOSIS — I2510 Atherosclerotic heart disease of native coronary artery without angina pectoris: Secondary | ICD-10-CM

## 2016-09-28 DIAGNOSIS — E7800 Pure hypercholesterolemia, unspecified: Secondary | ICD-10-CM | POA: Diagnosis not present

## 2016-09-28 DIAGNOSIS — I10 Essential (primary) hypertension: Secondary | ICD-10-CM

## 2016-09-28 NOTE — Patient Instructions (Signed)

## 2016-10-09 ENCOUNTER — Other Ambulatory Visit: Payer: Self-pay | Admitting: Cardiovascular Disease

## 2016-10-09 NOTE — Telephone Encounter (Signed)
OK to refill.  Recent lab results requested from primary care.

## 2016-10-09 NOTE — Telephone Encounter (Signed)
Please advise on refill request as the patient does not have a recent lipid panel in epic and per last office visit, lipids are followed by pcp. Thanks, MI

## 2017-08-17 ENCOUNTER — Other Ambulatory Visit: Payer: Self-pay | Admitting: Cardiovascular Disease

## 2017-08-27 ENCOUNTER — Ambulatory Visit (INDEPENDENT_AMBULATORY_CARE_PROVIDER_SITE_OTHER): Payer: Medicare Other

## 2017-08-27 ENCOUNTER — Encounter (HOSPITAL_COMMUNITY): Payer: Self-pay | Admitting: *Deleted

## 2017-08-27 ENCOUNTER — Ambulatory Visit (HOSPITAL_COMMUNITY)
Admission: EM | Admit: 2017-08-27 | Discharge: 2017-08-27 | Disposition: A | Discharge status: Home / Self Care | Payer: Medicare Other | Attending: Internal Medicine | Admitting: Internal Medicine

## 2017-08-27 DIAGNOSIS — S92355A Nondisplaced fracture of fifth metatarsal bone, left foot, initial encounter for closed fracture: Secondary | ICD-10-CM

## 2017-08-27 DIAGNOSIS — S92354A Nondisplaced fracture of fifth metatarsal bone, right foot, initial encounter for closed fracture: Secondary | ICD-10-CM | POA: Diagnosis not present

## 2017-08-27 DIAGNOSIS — M79671 Pain in right foot: Secondary | ICD-10-CM

## 2017-08-27 DIAGNOSIS — M25571 Pain in right ankle and joints of right foot: Secondary | ICD-10-CM | POA: Diagnosis not present

## 2017-08-27 NOTE — ED Provider Notes (Signed)
[facility]    CSN: [id number] Arrival date & time: 08/27/17  1410     History   Chief Complaint Chief Complaint  Patient presents with  . Foot Injury    HPI RUDOLPHO CLAXTON is a 76 y.o. male.   76 year old male with history of CAD, diabetes, hypertension, hyperlipidemia comes in for right foot pain after having foot trapped under a tractor. He states his foot was inverted when this happened. States pain most significant on medial aspect of foot and ankle. Has also experienced some numbness and tingling. Denies open wound. Has not taken anything for the symptoms.      Past Medical History:  Diagnosis Date  . CAD (coronary artery disease)   . Coronary artery disease    NSTEMI 2001, 2003   . Diabetes mellitus   . History of MI (myocardial infarction)   . Hyperlipidemia   . Hypertension   . Inguinal hernia   . Syncope, carotid sinus     Patient Active Problem List   Diagnosis Date Noted  . Medication management 10/04/2013  . Bilateral pulmonary embolism (HCC) 07/20/2013  . Normocytic anemia 07/20/2013  . Hypokalemia 07/20/2013  . Respiratory failure, post-operative (HCC) 07/07/2013  . Hemorrhagic shock (HCC) 07/05/2013  . Acute blood loss anemia 07/04/2013  . Acute lower GI bleeding 07/01/2013  . Abdominal pain 07/01/2013  . Inguinal hernia unilateral, non-recurrent, right 06/06/2013  . Chest pain 09/17/2012  . Diabetes (HCC) 09/17/2012  . HYPERLIPIDEMIA-MIXED 01/29/2009  . 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 HYPERTENSION, BENIGN 01/29/2009  . CAD, NATIVE VESSEL 01/29/2009    Past Surgical History:  Procedure Laterality Date  . CARDIAC CATHETERIZATION  00,03   stents both times  . COLONOSCOPY    . COLONOSCOPY N/A 07/06/2013   Procedure: COLONOSCOPY;  Surgeon: [name], MD;  Location: [facility] ENDOSCOPY;  Service: Endoscopy;  Laterality: N/A;  Tattoo,clips,bicap available  . ESOPHAGOGASTRODUODENOSCOPY N/A 07/06/2013   Procedure:  ESOPHAGOGASTRODUODENOSCOPY (EGD);  Surgeon: [name], MD;  Location: [facility];  Service: Endoscopy;  Laterality: N/A;  at bedside  . EYE SURGERY  2013   Cataracts-both  . INGUINAL HERNIA REPAIR Right 06/29/2013   Procedure: HERNIA REPAIR INGUINAL ADULT;  Surgeon: [name], MD;  Location: [facility];  Service: General;  Laterality: Right;  . INSERTION OF MESH Right 06/29/2013   Procedure: INSERTION OF MESH;  Surgeon: [name], MD;  Location: [facility];  Service: General;  Laterality: Right;  . LAPAROTOMY Right 07/06/2013   Procedure: EXPLORATORY LAPAROTOMY;  Surgeon: [name], MD;  Location: [facility] OR;  Service: General;  Laterality: Right;  . None    . ROTATOR CUFF REPAIR  2013   lt       Home Medications    Prior to Admission medications   Medication Sig Start Date End Date Taking? Authorizing Provider  BYDUREON 2 MG PEN Inject 1 Dose as directed once a week. 08/31/16   [provider]  clopidogrel (PLAVIX) 75 MG tablet TAKE 1 TABLET BY MOUTH  DAILY 10/09/16   Kathleene Hazel, MD  glimepiride (AMARYL) 4 MG tablet Take 8 mg by mouth daily before breakfast.    [provider]  loratadine (CLARITIN) 10 MG tablet Take 10 mg by mouth daily as needed for allergies.     [provider]  metFORMIN (GLUCOPHAGE) 1000 MG tablet Take 1,000 mg by mouth 2 (two) times daily with a meal.  [provider]  metoprolol tartrate (LOPRESSOR) 25 MG tablet TAKE 1 TABLET BY MOUTH  TWICE A DAY 08/17/17   Kathleene Hazel, MD  pravastatin (PRAVACHOL) 40 MG tablet TAKE 1 TABLET BY MOUTH  EVERY EVENING 08/17/17   Kathleene Hazel, MD  PRESCRIPTION MEDICATION Glucocil Food Supplement. 2 at lunch, 2 at dinner    [provider]  ramipril (ALTACE) 10 MG capsule TAKE 1 CAPSULE BY MOUTH  DAILY - 08/17/17   Kathleene Hazel, MD    Family History Family History  Problem Relation Age of  Onset  . Cancer Brother        colon cancer    Social History Social History  Substance Use Topics  . Smoking status: Never Smoker  . Smokeless tobacco: Never Used  . Alcohol use No     Allergies   Patient has no known allergies.   Review of Systems Review of Systems  Reason unable to perform ROS: See HPI as above.     Physical Exam Triage Vital Signs ED Triage Vitals  Enc Vitals Group     BP 08/27/17 1633 (!) 158/102     Pulse Rate 08/27/17 1633 78     Resp 08/27/17 1633 18     Temp 08/27/17 1633 99.2 F (37.3 C)     Temp Source 08/27/17 1633 Oral     SpO2 08/27/17 1633 99 %     Weight --      Height --      Head Circumference --      Peak Flow --      Pain Score 08/27/17 1629 1     Pain Loc --      Pain Edu? --      Excl. in GC? --    No data found.   Updated Vital Signs BP (!) 158/102 (BP Location: Right Arm)   Pulse 78   Temp 99.2 F (37.3 C) (Oral)   Resp 18   SpO2 99%   Physical Exam  Constitutional: He is oriented to person, place, and time. He appears well-developed and well-nourished. No distress.  HENT:  Head: Normocephalic and atraumatic.  Eyes: Pupils are equal, round, and reactive to light. Conjunctivae are normal.  Musculoskeletal:  Bruising of right greater toe area. Patient with hallux valgus deformity. Tenderness on palpation of proximal MTPs, most significant at medial aspect and 5th MTP. Sensation intact. Pedal pulses 2+ and equal bilaterally.   Neurological: He is alert and oriented to person, place, and time.     UC Treatments / Results  Labs (all labs ordered are listed, but only abnormal results are displayed) Labs Reviewed - No data to display  EKG  EKG Interpretation None       Radiology Dg Ankle Complete Right  Result Date: 08/27/2017 CLINICAL DATA:  Patient states that his right foot and ankle became pinned under a tractor tire today, crushing injury. Brusing and pain. No Hx EXAM: RIGHT ANKLE - COMPLETE 3+  VIEW COMPARISON:  None. FINDINGS: Distal tibia and fibula appear intact and normally aligned. Ankle mortise is symmetric. Questionable old healed fracture within the lateral malleolus. Acute slightly displaced fracture noted at the base of the fifth metatarsal bone. Remainder of the visualized hindfoot and midfoot appear intact and normally aligned. IMPRESSION: Slightly displaced fracture at the base of the right fifth metatarsal bone. Electronically Signed   By: Bary Richard M.D.   On: 08/27/2017 16:50   Dg Foot Complete Right  Result  Date: 08/27/2017 CLINICAL DATA:  Foot pinned under tire with pain, initial encounter EXAM: RIGHT FOOT COMPLETE - 3+ VIEW COMPARISON:  None. FINDINGS: There is a transverse fracture through the base of the fifth metatarsal. Hallux valgus deformity is noted. Degenerative changes the tarsal bones are noted. No other definitive fracture is seen. IMPRESSION: Tarsal degenerative changes. Transverse fracture through the base of the fifth metatarsal. Electronically Signed   By: Alcide Clever M.D.   On: 08/27/2017 16:50    Procedures Procedures (including critical care time)  Medications Ordered in UC Medications - No data to display   Initial Impression / Assessment and Plan / UC Course  I have reviewed the triage vital signs and the nursing notes.  Pertinent labs & imaging results that were available during my care of the patient were reviewed by me and considered in my medical decision making (see chart for details).     Cam walker with crutches. Patient to not bear weight until cleared by orthopedics. Patient declined narcotics. Take tylenol for pain. Follow up with orthopedics for further evaluation. Return precautions given.   Final Clinical Impressions(s) / UC Diagnoses   Final diagnoses:  Nondisplaced fracture of fifth metatarsal bone, left foot, initial encounter for closed fracture    New Prescriptions Discharge Medication List as of 08/27/2017  5:10 PM           Belinda Fisher, PA-C 08/27/17 2042

## 2017-08-27 NOTE — ED Triage Notes (Signed)
Tractor    Wheel     Pinned     r  Foot  On  Ground       Hurts   When      NIKE   Weight   Pt     Reports  Pain  And  Swelling

## 2017-08-27 NOTE — Discharge Instructions (Signed)
Xray with transverse fracture. Cam walker with crutches. You can take tylenol for pain. Ice compress and elevation. Please call orthopedics as soon as possible for appointment for further evaluation and treatment needed. If experiencing worsening of symptoms, changes in color of toes, swelling of toes, increased pain, follow up with orthopedics, here or with emergency department for further evaluation.

## 2017-10-09 ENCOUNTER — Other Ambulatory Visit: Payer: Self-pay | Admitting: Cardiovascular Disease

## 2017-12-01 DIAGNOSIS — I2510 Atherosclerotic heart disease of native coronary artery without angina pectoris: Secondary | ICD-10-CM | POA: Insufficient documentation

## 2017-12-01 DIAGNOSIS — I252 Old myocardial infarction: Secondary | ICD-10-CM | POA: Insufficient documentation

## 2017-12-01 DIAGNOSIS — E785 Hyperlipidemia, unspecified: Secondary | ICD-10-CM | POA: Insufficient documentation

## 2017-12-01 DIAGNOSIS — I10 Essential (primary) hypertension: Secondary | ICD-10-CM | POA: Insufficient documentation

## 2017-12-01 DIAGNOSIS — K4090 Unilateral inguinal hernia, without obstruction or gangrene, not specified as recurrent: Secondary | ICD-10-CM | POA: Insufficient documentation

## 2017-12-01 DIAGNOSIS — G9001 Carotid sinus syncope: Secondary | ICD-10-CM | POA: Insufficient documentation

## 2017-12-21 ENCOUNTER — Other Ambulatory Visit: Payer: Self-pay | Admitting: Cardiovascular Disease

## 2017-12-22 ENCOUNTER — Encounter: Payer: Self-pay | Admitting: Cardiovascular Disease

## 2017-12-22 ENCOUNTER — Ambulatory Visit: Payer: Medicare Other | Admitting: Cardiovascular Disease

## 2017-12-22 VITALS — BP 146/80 | HR 66 | Ht 74.0 in | Wt 258.4 lb

## 2017-12-22 DIAGNOSIS — E7800 Pure hypercholesterolemia, unspecified: Secondary | ICD-10-CM | POA: Diagnosis not present

## 2017-12-22 DIAGNOSIS — I10 Essential (primary) hypertension: Secondary | ICD-10-CM | POA: Diagnosis not present

## 2017-12-22 DIAGNOSIS — I2510 Atherosclerotic heart disease of native coronary artery without angina pectoris: Secondary | ICD-10-CM | POA: Diagnosis not present

## 2017-12-22 NOTE — Patient Instructions (Signed)

## 2017-12-22 NOTE — Progress Notes (Signed)
Chief Complaint  Patient presents with  . Coronary Artery Disease   History of Present Illness: 77 yo male with a history of DM, HTN, HLD, coronary artery disease status post non-ST elevation myocardial infarction in 2001 and 2003 and PE in 2014 who is here today for cardiac follow up. Cardiac history includes bare-metal stent placement in the RCA in 2001 and Cypher drug-eluting stent placement in the circumflex artery in 2003. Stress myoview February 2011 with LVEF of 55%. No ischemia.  Patient underwent inguinal hernia repair in July of 2014 complicated by a diverticular bleed requiring right partial colectomy. His Plavix was stopped and then he developed a pulmonary embolus in August 2014 for which he was treated with Coumadin. He has since completed his coumadin therapy and is back on Plavix.   He is here today for follow up. The patient denies any chest pain, dyspnea, palpitations, lower extremity edema, orthopnea, PND, dizziness, near syncope or syncope. He enjoys restoring older VW Beetles.   Primary Care Physician: [name], MD  Past Medical History:  Diagnosis Date  . CAD (coronary artery disease)   . Coronary artery disease    NSTEMI 2001, 2003   . Diabetes mellitus   . History of MI (myocardial infarction)   . Hyperlipidemia   . Hypertension   . Inguinal hernia   . Syncope, carotid sinus     Past Surgical History:  Procedure Laterality Date  . CARDIAC CATHETERIZATION  00,03   stents both times  . COLONOSCOPY    . COLONOSCOPY N/A 07/06/2013   Procedure: COLONOSCOPY;  Surgeon: [name], MD;  Location: [facility] ENDOSCOPY;  Service: Endoscopy;  Laterality: N/A;  Tattoo,clips,bicap available  . ESOPHAGOGASTRODUODENOSCOPY N/A 07/06/2013   Procedure: ESOPHAGOGASTRODUODENOSCOPY (EGD);  Surgeon: [name], MD;  Location: [facility] ENDOSCOPY;  Service: Endoscopy;  Laterality: N/A;  at bedside  . EYE SURGERY  2013   Cataracts-both  . INGUINAL HERNIA REPAIR  Right 06/29/2013   Procedure: HERNIA REPAIR INGUINAL ADULT;  Surgeon: [name], MD;  Location: [facility];  Service: General;  Laterality: Right;  . INSERTION OF MESH Right 06/29/2013   Procedure: INSERTION OF MESH;  Surgeon: [name], MD;  Location: [facility];  Service: General;  Laterality: Right;  . LAPAROTOMY Right 07/06/2013   Procedure: EXPLORATORY LAPAROTOMY;  Surgeon: [name], MD;  Location: [facility] OR;  Service: General;  Laterality: Right;  . None    . ROTATOR CUFF REPAIR  2013   lt    Current Outpatient Medications  Medication Sig Dispense Refill  . clopidogrel (PLAVIX) 75 MG tablet TAKE 1 TABLET BY MOUTH  DAILY 90 tablet 0  . loratadine (CLARITIN) 10 MG tablet Take 10 mg by mouth daily as needed for allergies.     . metFORMIN (GLUCOPHAGE) 1000 MG tablet Take 1,000 mg by mouth 2 (two) times daily with a meal.     . metoprolol tartrate (LOPRESSOR) 25 MG tablet TAKE 1 TABLET BY MOUTH  TWICE A DAY 60 tablet 0  . pravastatin (PRAVACHOL) 40 MG tablet TAKE 1 TABLET BY MOUTH  EVERY EVENING 30 tablet 0  . PRESCRIPTION MEDICATION Glucocil Food Supplement. 2 at lunch, 2 at dinner    . ramipril (ALTACE) 10 MG capsule TAKE 1 CAPSULE BY MOUTH  DAILY - 30 capsule 0   No current facility-administered medications for this visit.     No Known Allergies  Social History   Socioeconomic History  .  Marital status: Married    Spouse name: Not on file  . Number of children: Not on file  . Years of education: Not on file  . Highest education level: Not on file  Social Needs  . Financial resource strain: Not on file  . Food insecurity - worry: Not on file  . Food insecurity - inability: Not on file  . Transportation needs - medical: Not on file  . Transportation needs - non-medical: Not on file  Occupational History  . Not on file  Tobacco Use  . Smoking status: Never Smoker  . Smokeless tobacco: Never Used  Substance and Sexual Activity  .  Alcohol use: No  . Drug use: No  . Sexual activity: Not on file  Other Topics Concern  . Not on file  Social History Narrative  . Not on file    Family History  Problem Relation Age of Onset  . Cancer Brother        colon cancer    Review of Systems:  As stated in the HPI and otherwise negative.   BP (!) 146/80   Pulse 66   Ht 6\' 2"  (1.88 m)   Wt 258 lb 6.4 oz (117.2 kg)   SpO2 97%   BMI 33.18 kg/m   Physical Examination:  General: Well developed, well nourished, NAD  HEENT: OP clear, mucus membranes moist  SKIN: warm, dry. No rashes. Neuro: No focal deficits  Musculoskeletal: Muscle strength 5/5 all ext  Psychiatric: Mood and affect normal  Neck: No JVD, no carotid bruits, no thyromegaly, no lymphadenopathy.  Lungs:Clear bilaterally, no wheezes, rhonci, crackles Cardiovascular: Regular rate and rhythm. No murmurs, gallops or rubs. Abdomen:Soft. Bowel sounds present. Non-tender.  Extremities: No lower extremity edema. Pulses are 2 + in the bilateral DP/PT.  EKG:  EKG is  ordered today. The ekg ordered today demonstrates NSR, rate 66 bpm. IVCD.   Recent Labs: No results found for requested labs within last 8760 hours.   Lipid Panel Followed in primary care   Wt Readings from Last 3 Encounters:  12/22/17 258 lb 6.4 oz (117.2 kg)  09/28/16 221 lb (100.2 kg)  05/30/15 214 lb 6.4 oz (97.3 kg)     Other studies Reviewed: Additional studies/ records that were reviewed today include: . Review of the above records demonstrates:   Assessment and Plan:   1. CAD without angina: No chest pain suggestive of angina. Will continue Plavix, beta blocker, Ace-inh and statin. He has not been on ASA due to GI upset in the past.  Will arrange echocardiogram to assess LV systolic function.   2. HYPERTENSION: BP is well controlled at home. No changes.   3. Hyperlipidemia: Lipids followed in primary care. Continue statin.   4. History of Pulmonary embolism: This was following  a GI surgery in 2014. He has been off of coumadin.   Current medicines are reviewed at length with the patient today.  The patient does not have concerns regarding medicines.  The following changes have been made:  no change  Labs/ tests ordered today include:   Orders Placed This Encounter  Procedures  . EKG 12-Lead  . ECHOCARDIOGRAM COMPLETE    Disposition:   FU with me in 12  months  Signed, Verne Carrowhristopher McAlhany, MD 12/22/2017 3:10 PM    [facility] [address], [place], [place]  [address] Phone: [phone]; Fax: [phone]

## 2017-12-23 ENCOUNTER — Other Ambulatory Visit: Payer: Self-pay | Admitting: Cardiovascular Disease

## 2018-01-04 ENCOUNTER — Other Ambulatory Visit: Payer: Self-pay

## 2018-01-04 ENCOUNTER — Ambulatory Visit (HOSPITAL_COMMUNITY): Payer: Medicare Other | Attending: Cardiovascular Disease

## 2018-01-04 VITALS — BP 138/70

## 2018-01-04 DIAGNOSIS — I10 Essential (primary) hypertension: Secondary | ICD-10-CM | POA: Diagnosis not present

## 2018-01-04 DIAGNOSIS — R55 Syncope and collapse: Secondary | ICD-10-CM | POA: Insufficient documentation

## 2018-01-04 DIAGNOSIS — E119 Type 2 diabetes mellitus without complications: Secondary | ICD-10-CM | POA: Diagnosis not present

## 2018-01-04 DIAGNOSIS — I252 Old myocardial infarction: Secondary | ICD-10-CM | POA: Diagnosis not present

## 2018-01-04 DIAGNOSIS — Z86711 Personal history of pulmonary embolism: Secondary | ICD-10-CM | POA: Diagnosis not present

## 2018-01-04 DIAGNOSIS — I2510 Atherosclerotic heart disease of native coronary artery without angina pectoris: Secondary | ICD-10-CM | POA: Insufficient documentation

## 2018-01-04 MED ORDER — PERFLUTREN LIPID MICROSPHERE
1.0000 mL | INTRAVENOUS | Status: AC | PRN
  Administered 2018-01-04: 1 mL via INTRAVENOUS

## 2018-03-01 ENCOUNTER — Other Ambulatory Visit: Payer: Self-pay | Admitting: Cardiovascular Disease

## 2018-06-10 ENCOUNTER — Emergency Department
Admission: EM | Admit: 2018-06-10 | Discharge: 2018-06-10 | Disposition: A | Discharge status: Other Acute Inpatient Hospital | Payer: Medicare Other | Source: Home / Self Care | Attending: Family Medicine | Admitting: Family Medicine

## 2018-06-10 ENCOUNTER — Emergency Department (HOSPITAL_COMMUNITY)
Admission: EM | Admit: 2018-06-10 | Discharge: 2018-06-10 | Disposition: A | Discharge status: Home / Self Care | Payer: Medicare Other | Attending: Emergency Medicine | Admitting: Emergency Medicine

## 2018-06-10 ENCOUNTER — Encounter (HOSPITAL_COMMUNITY): Payer: Self-pay

## 2018-06-10 ENCOUNTER — Other Ambulatory Visit: Payer: Self-pay

## 2018-06-10 ENCOUNTER — Encounter: Payer: Self-pay | Admitting: *Deleted

## 2018-06-10 ENCOUNTER — Emergency Department (HOSPITAL_COMMUNITY): Payer: Medicare Other

## 2018-06-10 DIAGNOSIS — R1030 Lower abdominal pain, unspecified: Secondary | ICD-10-CM | POA: Diagnosis not present

## 2018-06-10 DIAGNOSIS — I10 Essential (primary) hypertension: Secondary | ICD-10-CM | POA: Insufficient documentation

## 2018-06-10 DIAGNOSIS — N179 Acute kidney failure, unspecified: Secondary | ICD-10-CM | POA: Diagnosis not present

## 2018-06-10 DIAGNOSIS — I252 Old myocardial infarction: Secondary | ICD-10-CM | POA: Diagnosis not present

## 2018-06-10 DIAGNOSIS — R6883 Chills (without fever): Secondary | ICD-10-CM | POA: Diagnosis not present

## 2018-06-10 DIAGNOSIS — R110 Nausea: Secondary | ICD-10-CM

## 2018-06-10 DIAGNOSIS — I2510 Atherosclerotic heart disease of native coronary artery without angina pectoris: Secondary | ICD-10-CM | POA: Insufficient documentation

## 2018-06-10 DIAGNOSIS — K432 Incisional hernia without obstruction or gangrene: Secondary | ICD-10-CM | POA: Diagnosis not present

## 2018-06-10 DIAGNOSIS — R030 Elevated blood-pressure reading, without diagnosis of hypertension: Secondary | ICD-10-CM

## 2018-06-10 DIAGNOSIS — E119 Type 2 diabetes mellitus without complications: Secondary | ICD-10-CM | POA: Diagnosis not present

## 2018-06-10 DIAGNOSIS — R1031 Right lower quadrant pain: Secondary | ICD-10-CM | POA: Diagnosis present

## 2018-06-10 DIAGNOSIS — N23 Unspecified renal colic: Secondary | ICD-10-CM

## 2018-06-10 DIAGNOSIS — Z7984 Long term (current) use of oral hypoglycemic drugs: Secondary | ICD-10-CM | POA: Insufficient documentation

## 2018-06-10 DIAGNOSIS — N202 Calculus of kidney with calculus of ureter: Secondary | ICD-10-CM | POA: Diagnosis not present

## 2018-06-10 DIAGNOSIS — N130 Hydronephrosis with ureteropelvic junction obstruction: Secondary | ICD-10-CM | POA: Insufficient documentation

## 2018-06-10 LAB — CBC WITH DIFFERENTIAL/PLATELET
BASOS ABS: 0 10*3/uL (ref 0.0–0.1)
Basophils Relative: 0 %
Eosinophils Absolute: 0 10*3/uL (ref 0.0–0.7)
Eosinophils Relative: 0 %
HEMATOCRIT: 41.9 % (ref 39.0–52.0)
HEMOGLOBIN: 13.6 g/dL (ref 13.0–17.0)
Lymphocytes Relative: 5 %
Lymphs Abs: 0.8 10*3/uL (ref 0.7–4.0)
MCH: 31.3 pg (ref 26.0–34.0)
MCHC: 32.5 g/dL (ref 30.0–36.0)
MCV: 96.5 fL (ref 78.0–100.0)
MONO ABS: 0.4 10*3/uL (ref 0.1–1.0)
Monocytes Relative: 3 %
NEUTROS ABS: 12.9 10*3/uL — AB (ref 1.7–7.7)
NEUTROS PCT: 92 %
Platelets: 298 10*3/uL (ref 150–400)
RBC: 4.34 MIL/uL (ref 4.22–5.81)
RDW: 13.6 % (ref 11.5–15.5)
WBC: 14.1 10*3/uL — ABNORMAL HIGH (ref 4.0–10.5)

## 2018-06-10 LAB — URINALYSIS, ROUTINE W REFLEX MICROSCOPIC
BACTERIA UA: NONE SEEN
BILIRUBIN URINE: NEGATIVE
Glucose, UA: 50 mg/dL — AB
KETONES UR: 20 mg/dL — AB
LEUKOCYTES UA: NEGATIVE
NITRITE: NEGATIVE
PH: 5 (ref 5.0–8.0)
PROTEIN: 30 mg/dL — AB
Specific Gravity, Urine: 1.024 (ref 1.005–1.030)

## 2018-06-10 LAB — COMPREHENSIVE METABOLIC PANEL
ALK PHOS: 61 U/L (ref 38–126)
ALT: 18 U/L (ref 0–44)
AST: 16 U/L (ref 15–41)
Albumin: 4.2 g/dL (ref 3.5–5.0)
Anion gap: 9 (ref 5–15)
BILIRUBIN TOTAL: 0.9 mg/dL (ref 0.3–1.2)
BUN: 31 mg/dL — ABNORMAL HIGH (ref 8–23)
CHLORIDE: 108 mmol/L (ref 98–111)
CO2: 23 mmol/L (ref 22–32)
Calcium: 9 mg/dL (ref 8.9–10.3)
Creatinine, Ser: 1.97 mg/dL — ABNORMAL HIGH (ref 0.61–1.24)
GFR calc Af Amer: 35 mL/min — ABNORMAL LOW (ref >60)
GFR, EST NON AFRICAN AMERICAN: 31 mL/min — AB (ref >60)
Glucose, Bld: 210 mg/dL — ABNORMAL HIGH (ref 70–99)
Potassium: 5.4 mmol/L — ABNORMAL HIGH (ref 3.5–5.1)
Sodium: 140 mmol/L (ref 135–145)
TOTAL PROTEIN: 7.5 g/dL (ref 6.5–8.1)

## 2018-06-10 LAB — POCT FASTING CBG KUC MANUAL ENTRY: POCT Glucose (KUC): 180 mg/dL — AB (ref 70–99)

## 2018-06-10 MED ORDER — ONDANSETRON 4 MG PO TBDP
4.0000 mg | ORAL_TABLET | Freq: Once | ORAL | Status: AC
  Administered 2018-06-10: 4 mg via ORAL

## 2018-06-10 MED ORDER — SODIUM CHLORIDE 0.9 % IV BOLUS
500.0000 mL | Freq: Once | INTRAVENOUS | Status: AC
  Administered 2018-06-10: 500 mL via INTRAVENOUS

## 2018-06-10 MED ORDER — HYDROMORPHONE HCL 1 MG/ML IJ SOLN
1.0000 mg | Freq: Once | INTRAMUSCULAR | Status: AC
  Administered 2018-06-10: 1 mg via INTRAVENOUS
  Filled 2018-06-10: qty 1

## 2018-06-10 MED ORDER — OXYCODONE-ACETAMINOPHEN 5-325 MG PO TABS: 1.0000 | ORAL_TABLET | Freq: Four times a day (QID) | ORAL | 0 refills | Status: DC | PRN

## 2018-06-10 MED ORDER — TAMSULOSIN HCL 0.4 MG PO CAPS: 0.4000 mg | ORAL_CAPSULE | Freq: Every day | ORAL | 0 refills | Status: DC

## 2018-06-10 MED ORDER — MORPHINE SULFATE (PF) 4 MG/ML IV SOLN
4.0000 mg | Freq: Once | INTRAVENOUS | Status: AC
  Administered 2018-06-10: 4 mg via INTRAVENOUS
  Filled 2018-06-10: qty 1

## 2018-06-10 NOTE — ED Provider Notes (Signed)
[facility]-EMERGENCY DEPT Provider Note   CSN: [id number] Arrival date & time: 06/10/18  1817     History   Chief Complaint Chief Complaint  Patient presents with  . Abdominal Pain  . Flank Pain    HPI Timothy [name] is a 77 y.o. male.  The history is provided by the patient. No language interpreter was used.  Abdominal Pain    Flank Pain  Associated symptoms include abdominal pain.   Timothy [name] is a 77 y.o. male who presents to the Emergency Department complaining of abdominal pain/flank pain.  He reports LLQ pain that began just prior to lunch today.  Pain is an aching, constant pain that radiates to his left pain.  He has associated nausea and diaphoresis with dark urine and chills.  Denies fevers, chest pain, sob, vomiting.  No prior similar sxs.  No recent illnesses.  He initially presented to urgent care and was referred to the ED for further eval.  He takes plavix daily.   Past Medical History:  Diagnosis Date  . CAD (coronary artery disease)   . Coronary artery disease    NSTEMI 2001, 2003   . Diabetes mellitus   . History of MI (myocardial infarction)   . Hyperlipidemia   . Hypertension   . Inguinal hernia   . Syncope, carotid sinus     Patient Active Problem List   Diagnosis Date Noted  . Inguinal hernia   . Syncope, carotid sinus   . Hypertension   . Hyperlipidemia   . History of MI (myocardial infarction)   . Coronary artery disease   . CAD (coronary artery disease)   . Medication management 10/04/2013  . Bilateral pulmonary embolism (HCC) 07/20/2013  . Normocytic anemia 07/20/2013  . Hypokalemia 07/20/2013  . Respiratory failure, post-operative (HCC) 07/07/2013  . Hemorrhagic shock (HCC) 07/05/2013  . Acute blood loss anemia 07/04/2013  . Acute lower GI bleeding 07/01/2013  . Abdominal pain 07/01/2013  . Inguinal hernia unilateral, non-recurrent, right 06/06/2013  . Chest pain 09/17/2012  . Diabetes (HCC) 09/17/2012    . HYPERLIPIDEMIA-MIXED 01/29/2009  . 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 HYPERTENSION, BENIGN 01/29/2009  . CAD, NATIVE VESSEL 01/29/2009    Past Surgical History:  Procedure Laterality Date  . APPENDECTOMY    . CARDIAC CATHETERIZATION  00,03   stents both times  . COLON SURGERY    . COLONOSCOPY    . COLONOSCOPY N/A 07/06/2013   Procedure: COLONOSCOPY;  Surgeon: Vertell NovakJames L Edwards Jr., MD;  [facility];  Service: Endoscopy;  Laterality: N/A;  Tattoo,clips,bicap available  . ESOPHAGOGASTRODUODENOSCOPY N/A 07/06/2013   Procedure: ESOPHAGOGASTRODUODENOSCOPY (EGD);  Surgeon: Vertell NovakJames L Edwards Jr., MD;  [facility];  Service: Endoscopy;  Laterality: N/A;  at bedside  . EYE SURGERY  2013   Cataracts-both  . INGUINAL HERNIA REPAIR Right 06/29/2013   Procedure: HERNIA REPAIR INGUINAL ADULT;  Surgeon: Velora Hecklerodd M Gerkin, MD;  Location: [facility];  Service: General;  Laterality: Right;  . INSERTION OF MESH Right 06/29/2013   Procedure: INSERTION OF MESH;  Surgeon: Velora Hecklerodd M Gerkin, MD;  Location: [facility];  Service: General;  Laterality: Right;  . LAPAROTOMY Right 07/06/2013   Procedure: EXPLORATORY LAPAROTOMY;  Surgeon: Shelly Rubensteinouglas A Blackman, MD;  Location: [facility] OR;  Service: General;  Laterality: Right;  . None    . ROTATOR CUFF REPAIR  2013   lt        Home Medications  Prior to Admission medications   Medication Sig Start Date End Date Taking? Authorizing Provider  clopidogrel (PLAVIX) 75 MG tablet TAKE 1 TABLET BY MOUTH  DAILY 03/02/18  Yes Kathleene Hazel, MD  insulin NPH-regular Human (NOVOLIN 70/30) (70-30) 100 UNIT/ML injection Inject into the skin.   Yes [provider]  loratadine (CLARITIN) 10 MG tablet Take 10 mg by mouth daily as needed for allergies.    Yes [provider]  metFORMIN (GLUCOPHAGE) 1000 MG tablet Take 1,000 mg by mouth 2 (two) times daily with a meal.    Yes [provider]  metoprolol  tartrate (LOPRESSOR) 25 MG tablet TAKE 1 TABLET BY MOUTH  TWICE A DAY 12/23/17  Yes Kathleene Hazel, MD  pravastatin (PRAVACHOL) 40 MG tablet TAKE 1 TABLET BY MOUTH  EVERY EVENING 03/02/18  Yes Kathleene Hazel, MD  ramipril (ALTACE) 10 MG capsule TAKE 1 CAPSULE BY MOUTH  DAILY 12/23/17  Yes Kathleene Hazel, MD  oxyCODONE-acetaminophen (PERCOCET/ROXICET) 5-325 MG tablet Take 1 tablet by mouth every 6 (six) hours as needed for severe pain. 06/10/18   Tilden Fossa, MD  tamsulosin (FLOMAX) 0.4 MG CAPS capsule Take 1 capsule (0.4 mg total) by mouth daily. 06/10/18   Tilden Fossa, MD    Family History Family History  Problem Relation Age of Onset  . Cancer Brother        colon cancer    Social History Social History   Tobacco Use  . Smoking status: Never Smoker  . Smokeless tobacco: Never Used  Substance Use Topics  . Alcohol use: No  . Drug use: No     Allergies   Patient has no known allergies.   Review of Systems Review of Systems  Gastrointestinal: Positive for abdominal pain.  Genitourinary: Positive for flank pain.  All other systems reviewed and are negative.    Physical Exam Updated Vital Signs BP (!) 155/79   Pulse 71   Temp 98 F (36.7 C) (Oral)   Resp 17   SpO2 96%   Physical Exam  Constitutional: He is oriented to person, place, and time. He appears well-developed and well-nourished.  Uncomfortable appearing.   HENT:  Head: Normocephalic and atraumatic.  Cardiovascular: Normal rate and regular rhythm.  No murmur heard. Pulmonary/Chest: Effort normal and breath sounds normal. No respiratory distress.  Abdominal: Soft. There is no rebound and no guarding.  Mild LLQ tenderness.  Left sided ventral hernia - soft and easily reducible.   Musculoskeletal: He exhibits no tenderness.  Trace pitting edema to BLE  Neurological: He is alert and oriented to person, place, and time.  Skin: Skin is warm and dry.  Psychiatric: He has a normal  mood and affect. His behavior is normal.  Nursing note and vitals reviewed.    ED Treatments / Results  Labs (all labs ordered are listed, but only abnormal results are displayed) Labs Reviewed  COMPREHENSIVE METABOLIC PANEL - Abnormal; Notable for the following components:      Result Value   Potassium 5.4 (*)    Glucose, Bld 210 (*)    BUN 31 (*)    Creatinine, Ser 1.97 (*)    GFR calc non Af Amer 31 (*)    GFR calc Af Amer 35 (*)    All other components within normal limits  CBC WITH DIFFERENTIAL/PLATELET - Abnormal; Notable for the following components:   WBC 14.1 (*)    Neutro Abs 12.9 (*)    All other components within  normal limits  URINALYSIS, ROUTINE W REFLEX MICROSCOPIC - Abnormal; Notable for the following components:   Glucose, UA 50 (*)    Hgb urine dipstick LARGE (*)    Ketones, ur 20 (*)    Protein, ur 30 (*)    RBC / HPF >50 (*)    All other components within normal limits    EKG None  Radiology Ct Renal Stone Study  Result Date: 06/10/2018 CLINICAL DATA:  Lower left abdominal pain which began this morning and is now in the left flank. EXAM: CT ABDOMEN AND PELVIS WITHOUT CONTRAST TECHNIQUE: Multidetector CT imaging of the abdomen and pelvis was performed following the standard protocol without IV contrast. COMPARISON:  CT abdomen and pelvis 07/05/2013. FINDINGS: Lower chest: There is some linear atelectasis or scar in the lung bases. No pleural or pericardial effusion. Calcific coronary artery disease is noted. Hepatobiliary: A few small stones are seen in the gallbladder. No evidence of cholecystitis. The liver is low attenuating consistent with fatty infiltration. Biliary tree appears normal. Pancreas: Unremarkable. No pancreatic ductal dilatation or surrounding inflammatory changes. Spleen: Normal in size without focal abnormality. Adrenals/Urinary Tract: The adrenal glands appear normal. There is mild left hydronephrosis with stranding about the left kidney and  ureter due to a punctate stone at the left ureterovesical junction. Two punctate nonobstructing left renal stones are also seen. Two right renal cysts are unchanged. The right ureter is unremarkable. The urinary bladder is almost completely decompressed. Stomach/Bowel: Scattered diverticulosis is most notable in the descending and sigmoid colon. The patient is status post right hemicolectomy. The stomach and small bowel are unremarkable. Vascular/Lymphatic: Aortic atherosclerosis. No enlarged abdominal or pelvic lymph nodes. Reproductive: Prostate is unremarkable. Other: No ascites. Small fat containing supraumbilical hernia is noted. Musculoskeletal: No acute or focal abnormality. Lumbar spondylosis most notable at L4-5 and L5-S1. IMPRESSION: Mild left hydronephrosis due to a punctate stone at the left UVJ. 2 punctate nonobstructing left renal stones are noted. Calcific aortic and coronary atherosclerosis. Fatty infiltration of the liver. Gallstones without evidence of cholecystitis. Small fat containing supraumbilical hernia. Electronically Signed   By: Drusilla Kanner M.D.   On: 06/10/2018 20:04    Procedures Procedures (including critical care time)  Medications Ordered in ED Medications  sodium chloride 0.9 % bolus 500 mL (0 mLs Intravenous Stopped 06/10/18 2135)  morphine 4 MG/ML injection 4 mg (4 mg Intravenous Given 06/10/18 1850)  HYDROmorphone (DILAUDID) injection 1 mg (1 mg Intravenous Given 06/10/18 2004)     Initial Impression / Assessment and Plan / ED Course  I have reviewed the triage vital signs and the nursing notes.  Pertinent labs & imaging results that were available during my care of the patient were reviewed by me and considered in my medical decision making (see chart for details).     Patient here for evaluation of left sided abdominal and flank pain. He is uncomfortable appearing on initial evaluation. History with pain medications and IV fluids. Labs demonstrate acute  kidney injury and CT is consistent with a left ureteral stone. No evidence of urinary tract infection. The patient is able to void in the department. Discussed with Dr. Annabell Howells with urology. Patient's pain is well controlled in the emergency department. Plan to discharge home with close urology follow-up. Return precautions discussed.  Final Clinical Impressions(s) / ED Diagnoses   Final diagnoses:  Renal colic on left side  AKI (acute kidney injury) [facility])    ED Discharge Orders  Ordered    tamsulosin (FLOMAX) 0.4 MG CAPS capsule  Daily     06/10/18 2100    oxyCODONE-acetaminophen (PERCOCET/ROXICET) 5-325 MG tablet  Every 6 hours PRN     06/10/18 2101       Tilden Fossa, MD 06/11/18 [phone]

## 2018-06-10 NOTE — ED Notes (Signed)
Bed: WA20 Expected date:  Expected time:  Means of arrival:  Comments: abd pain 

## 2018-06-10 NOTE — ED Provider Notes (Signed)
[name]    CSN: [id number] Arrival date & time: 06/10/18  1641     History   Chief Complaint Chief Complaint  Patient presents with  . Abdominal Pain  . Chills  . Back Pain    HPI Timothy [name] is a 77 y.o. male.   HPI Timothy [name] is a 77 y.o. male presenting to UC with extensive PMH c/o sudden onset lower abdominal pain radiating into his back, worse on Left lower back, associated nausea w/o vomiting.  Pains is moderate to severe.  He has had chills and sweats, dark urine. Denies diarrhea.  Last BM was this morning but was small for pt.  Hx of several abdominal surgeries including appendectomy and surgery to fix a GI bleed. Hx of abdominal hernia. He is currently on plavix due to PE secondary to the abdominal surgery in 2012 for GI bleed.  Denies chest pain or SOB.   Past Medical History:  Diagnosis Date  . CAD (coronary artery disease)   . Coronary artery disease    NSTEMI 2001, 2003   . Diabetes mellitus   . History of MI (myocardial infarction)   . Hyperlipidemia   . Hypertension   . Inguinal hernia   . Syncope, carotid sinus     Patient Active Problem List   Diagnosis Date Noted  . Inguinal hernia   . Syncope, carotid sinus   . Hypertension   . Hyperlipidemia   . History of MI (myocardial infarction)   . Coronary artery disease   . CAD (coronary artery disease)   . Medication management 10/04/2013  . Bilateral pulmonary embolism (HCC) 07/20/2013  . Normocytic anemia 07/20/2013  . Hypokalemia 07/20/2013  . Respiratory failure, post-operative (HCC) 07/07/2013  . Hemorrhagic shock (HCC) 07/05/2013  . Acute blood loss anemia 07/04/2013  . Acute lower GI bleeding 07/01/2013  . Abdominal pain 07/01/2013  . Inguinal hernia unilateral, non-recurrent, right 06/06/2013  . Chest pain 09/17/2012  . Diabetes (HCC) 09/17/2012  . HYPERLIPIDEMIA-MIXED 01/29/2009  . 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 HYPERTENSION, BENIGN 01/29/2009  . CAD,  NATIVE VESSEL 01/29/2009    Past Surgical History:  Procedure Laterality Date  . APPENDECTOMY    . CARDIAC CATHETERIZATION  00,03   stents both times  . COLON SURGERY    . COLONOSCOPY    . COLONOSCOPY N/A 07/06/2013   Procedure: COLONOSCOPY;  Surgeon: [name], MD;  Location: [facility] END[facility]COPY;  Service: Endoscopy;  Laterality: N/A;  Tattoo,clips,bicap available  . ESOPHAGOGASTRODUODENOSCOPY N/A 07/06/2013   Procedure: ESOPHAGOGASTRODUODENOSCOPY (EGD);  Surgeon: [name], MD;  Location: [facility] END[facility];  Service: Endoscopy;  Laterality: N/A;  at bedside  . EYE SURGERY  2013   Cataracts-both  . INGUINAL HERNIA REPAIR Right 06/29/2013   Procedure: HERNIA REPAIR INGUINAL ADULT;  Surgeon: [name], MD;  Location: [facility];  Service: General;  Laterality: Right;  . INSERTION OF MESH Right 06/29/2013   Procedure: INSERTION OF MESH;  Surgeon: [name], MD;  Location: [facility];  Service: General;  Laterality: Right;  . LAPAROTOMY Right 07/06/2013   Procedure: EXPLORATORY LAPAROTOMY;  Surgeon: [name], MD;  Location: [facility] OR;  Service: General;  Laterality: Right;  . None    . ROTATOR CUFF REPAIR  2013   lt       Home Medications    Prior to Admission medications   Medication Sig Start Date End Date Taking? Authorizing Provider  clopidogrel (  PLAVIX) 75 MG tablet TAKE 1 TABLET BY MOUTH  DAILY 03/02/18  Yes Kathleene HazelMcAlhany, Christopher D, MD  insulin NPH-regular Human (NOVOLIN 70/30) (70-30) 100 UNIT/ML injection Inject into the skin.   Yes [provider]  loratadine (CLARITIN) 10 MG tablet Take 10 mg by mouth daily as needed for allergies.    Yes [provider]  metFORMIN (GLUCOPHAGE) 1000 MG tablet Take 1,000 mg by mouth 2 (two) times daily with a meal.    Yes [provider]  metoprolol tartrate (LOPRESSOR) 25 MG tablet TAKE 1 TABLET BY MOUTH  TWICE A DAY 12/23/17  Yes Kathleene HazelMcAlhany, Christopher D, MD    pravastatin (PRAVACHOL) 40 MG tablet TAKE 1 TABLET BY MOUTH  EVERY EVENING 03/02/18  Yes Kathleene HazelMcAlhany, Christopher D, MD  ramipril (ALTACE) 10 MG capsule TAKE 1 CAPSULE BY MOUTH  DAILY 12/23/17  Yes Kathleene HazelMcAlhany, Christopher D, MD  PRESCRIPTION MEDICATION Glucocil Food Supplement. 2 at lunch, 2 at dinner    [provider]    Family History Family History  Problem Relation Age of Onset  . Cancer Brother        colon cancer    Social History Social History   Tobacco Use  . Smoking status: Never Smoker  . Smokeless tobacco: Never Used  Substance Use Topics  . Alcohol use: No  . Drug use: No     Allergies   Patient has no known allergies.   Review of Systems Review of Systems  Constitutional: Positive for chills, diaphoresis and fatigue.  Respiratory: Negative for cough and shortness of breath.   Cardiovascular: Negative for chest pain and palpitations.  Gastrointestinal: Positive for abdominal pain and nausea. Negative for blood in stool, diarrhea and vomiting.  Genitourinary: Positive for hematuria (? dark urine). Negative for dysuria and frequency.  Musculoskeletal: Positive for back pain.     Physical Exam Triage Vital Signs ED Triage Vitals [06/10/18 1709]  Enc Vitals Group     BP (!) 183/80     Pulse Rate 68     Resp 18     Temp 97.9 F (36.6 C)     Temp Source Oral     SpO2 94 %     Weight 255 lb (115.7 kg)     Height      Head Circumference      Peak Flow      Pain Score 7     Pain Loc      Pain Edu?      Excl. in GC?    No data found.  Updated Vital Signs BP (!) 169/74 (BP Location: Right Arm)   Pulse 68   Temp 97.9 F (36.6 C) (Oral)   Resp 18   Wt 255 lb (115.7 kg)   SpO2 94%   BMI 32.74 kg/m   Visual Acuity Right Eye Distance:   Left Eye Distance:   Bilateral Distance:    Right Eye Near:   Left Eye Near:    Bilateral Near:     Physical Exam  Constitutional: He is oriented to person, place, and time. He appears well-developed  and well-nourished.  Non-toxic appearance. He appears ill. No distress.  HENT:  Head: Normocephalic and atraumatic.  Eyes: EOM are normal.  Neck: Normal range of motion.  Cardiovascular: Normal rate and regular rhythm.  Pulmonary/Chest: Effort normal and breath sounds normal.  Abdominal: There is tenderness in the right lower quadrant, suprapubic area and left lower quadrant. A hernia is present. Hernia confirmed positive in  the ventral area ( soft, large but reducible).  Musculoskeletal: Normal range of motion.  Neurological: He is alert and oriented to person, place, and time.  Skin: Skin is warm. He is diaphoretic.  Psychiatric: He has a normal mood and affect. His behavior is normal.  Nursing note and vitals reviewed.    UC Treatments / Results  Labs (all labs ordered are listed, but only abnormal results are displayed) Labs Reviewed  POCT FASTING CBG [facility] MANUAL ENTRY - Abnormal; Notable for the following components:      Result Value   POCT Glucose ([facility]) 180 (*)    All other components within normal limits    EKG See scanned EKG   Radiology No results found.  Procedures Procedures (including critical care time)  Medications Ordered in UC Medications  ondansetron (ZOFRAN-ODT) disintegrating tablet 4 mg (4 mg Oral Given 06/10/18 1741)    Initial Impression / Assessment and Plan / UC Course  I have reviewed the triage vital signs and the nursing notes.  Pertinent labs & imaging results that were available during my care of the patient were reviewed by me and considered in my medical decision making (see chart for details).     Pt appears unwell. Extensive PMH including several abdominal surgeries.  Pt does not have an appendix. DDx: SBO, slow GI bleed, AAA, mesenteric ischemia, diverticulitis Encouraged pt to go to ED via EMS for further evaluation. Pt initially reluctant but agreed to go via EMS. Pt accompanied by his wife and daughter.  Pt discharged in stable  condition.   Final Clinical Impressions(s) / UC Diagnoses   Final diagnoses:  Lower abdominal pain  Chills  Nausea without vomiting  Elevated blood pressure reading   Discharge Instructions   None    ED Prescriptions    None     Controlled Substance Prescriptions [place] Controlled Substance Registry consulted? Not Applicable   Rolla Plate 06/10/18 1753

## 2018-06-10 NOTE — ED Triage Notes (Addendum)
Patient arrived via ems From Urgent Care in [place]. Patient c/o of left lower abdominal pain. That started this morning that has now radiated to his left flank. Patient was having difficultly with his urine stream. No hx. Of kidney stones. Patient c/o Nausea at urgent care and 4mg  of zofran given. Pain 7/10.

## 2018-06-10 NOTE — Discharge Instructions (Addendum)
You have a 2 mm ureteral stone on the left.  Get rechecked immediately if you develop fevers, uncontrolled pain, or cannot urinate.

## 2018-06-10 NOTE — ED Triage Notes (Signed)
Patient c/o sudden onset lower abdominal pain that goes into his back since this afternoon. He c/o chills, sweats and is pale in color. C/o nausea without vomiting or diarrhea. Current glucose 180.

## 2018-06-12 ENCOUNTER — Telehealth: Payer: Self-pay

## 2018-06-12 NOTE — Telephone Encounter (Signed)
Left msg with pt asking about condition since ER visit. Advised to call should he have any additional questions or concerns.

## 2018-08-19 ENCOUNTER — Other Ambulatory Visit: Payer: Self-pay | Admitting: Cardiovascular Disease

## 2018-09-01 ENCOUNTER — Encounter (HOSPITAL_BASED_OUTPATIENT_CLINIC_OR_DEPARTMENT_OTHER): Payer: Medicare Other | Attending: Internal Medicine

## 2018-09-01 ENCOUNTER — Other Ambulatory Visit (HOSPITAL_COMMUNITY)
Admission: RE | Admit: 2018-09-01 | Discharge: 2018-09-01 | Disposition: A | Discharge status: Home / Self Care | Payer: Medicare Other | Source: Other Acute Inpatient Hospital | Attending: Internal Medicine | Admitting: Internal Medicine

## 2018-09-01 DIAGNOSIS — Z794 Long term (current) use of insulin: Secondary | ICD-10-CM | POA: Diagnosis not present

## 2018-09-01 DIAGNOSIS — E11621 Type 2 diabetes mellitus with foot ulcer: Secondary | ICD-10-CM | POA: Diagnosis present

## 2018-09-01 DIAGNOSIS — L97521 Non-pressure chronic ulcer of other part of left foot limited to breakdown of skin: Secondary | ICD-10-CM | POA: Diagnosis not present

## 2018-09-01 DIAGNOSIS — I2510 Atherosclerotic heart disease of native coronary artery without angina pectoris: Secondary | ICD-10-CM | POA: Diagnosis not present

## 2018-09-01 DIAGNOSIS — Z955 Presence of coronary angioplasty implant and graft: Secondary | ICD-10-CM | POA: Insufficient documentation

## 2018-09-04 LAB — AEROBIC CULTURE W GRAM STAIN (SUPERFICIAL SPECIMEN)

## 2018-09-06 ENCOUNTER — Encounter (HOSPITAL_BASED_OUTPATIENT_CLINIC_OR_DEPARTMENT_OTHER): Payer: Medicare Other

## 2018-09-08 ENCOUNTER — Encounter (HOSPITAL_BASED_OUTPATIENT_CLINIC_OR_DEPARTMENT_OTHER): Payer: Medicare Other | Attending: Internal Medicine

## 2018-09-08 DIAGNOSIS — Z794 Long term (current) use of insulin: Secondary | ICD-10-CM | POA: Diagnosis not present

## 2018-09-08 DIAGNOSIS — L97522 Non-pressure chronic ulcer of other part of left foot with fat layer exposed: Secondary | ICD-10-CM | POA: Diagnosis not present

## 2018-09-08 DIAGNOSIS — E1136 Type 2 diabetes mellitus with diabetic cataract: Secondary | ICD-10-CM | POA: Diagnosis not present

## 2018-09-08 DIAGNOSIS — E11621 Type 2 diabetes mellitus with foot ulcer: Secondary | ICD-10-CM | POA: Diagnosis present

## 2018-09-15 DIAGNOSIS — E11621 Type 2 diabetes mellitus with foot ulcer: Secondary | ICD-10-CM | POA: Diagnosis not present

## 2018-09-30 ENCOUNTER — Other Ambulatory Visit: Payer: Self-pay | Admitting: Cardiovascular Disease

## 2019-01-08 NOTE — Progress Notes (Signed)
Chief Complaint  Patient presents with  . Follow-up    HTN   History of Present Illness: 78 yo male with a history of DM, HTN, HLD, coronary artery disease status post non-ST elevation myocardial infarction in 2001 and 2003 and PE in 2014 who is here today for cardiac follow up. Cardiac history includes bare-metal stent placement in the RCA in 2001 and Cypher drug-eluting stent placement in the circumflex artery in 2003. Stress myoview February 2011 with LVEF of 55%. No ischemia.  Patient underwent inguinal hernia repair in July of 2014 complicated by a diverticular bleed requiring right partial colectomy. His Plavix was stopped and then he developed a pulmonary embolus in August 2014 for which he was treated with Coumadin. He has since completed his coumadin therapy and is back on Plavix. Echo January 2019 with LVEF=55%.    He is here today for follow up. The patient denies any chest pain, dyspnea, palpitations, lower extremity edema, orthopnea, PND, dizziness, near syncope or syncope.   He enjoys restoring older VW Beetles.   Primary Care Physician: Kaleen MaskElkins, Wilson Oliver, MD  Past Medical History:  Diagnosis Date  . CAD (coronary artery disease)   . Coronary artery disease    NSTEMI 2001, 2003   . Diabetes mellitus   . History of MI (myocardial infarction)   . Hyperlipidemia   . Hypertension   . Inguinal hernia   . Syncope, carotid sinus     Past Surgical History:  Procedure Laterality Date  . APPENDECTOMY    . CARDIAC CATHETERIZATION  00,03   stents both times  . COLON SURGERY    . COLONOSCOPY    . COLONOSCOPY N/A 07/06/2013   Procedure: COLONOSCOPY;  Surgeon: Vertell NovakJames L Edwards Jr., MD;  [facility];  Service: Endoscopy;  Laterality: N/A;  Tattoo,clips,bicap available  . ESOPHAGOGASTRODUODENOSCOPY N/A 07/06/2013   Procedure: ESOPHAGOGASTRODUODENOSCOPY (EGD);  Surgeon: Vertell NovakJames L Edwards Jr., MD;  [facility];  Service: Endoscopy;  Laterality: N/A;  at bedside   . EYE SURGERY  2013   Cataracts-both  . INGUINAL HERNIA REPAIR Right 06/29/2013   Procedure: HERNIA REPAIR INGUINAL ADULT;  Surgeon: Velora Hecklerodd M Gerkin, MD;  Location: [facility];  Service: General;  Laterality: Right;  . INSERTION OF MESH Right 06/29/2013   Procedure: INSERTION OF MESH;  Surgeon: Velora Hecklerodd M Gerkin, MD;  Location: [facility];  Service: General;  Laterality: Right;  . LAPAROTOMY Right 07/06/2013   Procedure: EXPLORATORY LAPAROTOMY;  Surgeon: Shelly Rubensteinouglas A Blackman, MD;  Location: [facility] OR;  Service: General;  Laterality: Right;  . None    . ROTATOR CUFF REPAIR  2013   lt    Current Outpatient Medications  Medication Sig Dispense Refill  . clopidogrel (PLAVIX) 75 MG tablet Take 1 tablet (75 mg total) by mouth daily. Please make annual appt for future refills. [phone]. Thank you. 90 tablet 0  . insulin NPH-regular Human (NOVOLIN 70/30) (70-30) 100 UNIT/ML injection Inject into the skin.    Marland Kitchen. loratadine (CLARITIN) 10 MG tablet Take 10 mg by mouth daily as needed for allergies.     . metFORMIN (GLUCOPHAGE) 1000 MG tablet Take 1,000 mg by mouth 2 (two) times daily with a meal.     . metoprolol tartrate (LOPRESSOR) 25 MG tablet Take 1 tablet (25 mg total) by mouth 2 (two) times daily. Please make annual appt for future refills. [phone]. Thank you. 180 tablet 0  . pravastatin (PRAVACHOL) 40 MG tablet Take 1 tablet (40  mg total) by mouth every evening. Please make annual appt for future refills. [phone]. Thank you. 90 tablet 0  . ramipril (ALTACE) 10 MG capsule Take 1 capsule (10 mg total) by mouth daily. Please make annual appt for future refills. [phone]. Thank you. 90 capsule 0   No current facility-administered medications for this visit.     No Known Allergies  Social History   Socioeconomic History  . Marital status: Married    Spouse name: Not on file  . Number of children: Not on file  . Years of education: Not on file  . Highest  education level: Not on file  Occupational History  . Not on file  Social Needs  . Financial resource strain: Not on file  . Food insecurity:    Worry: Not on file    Inability: Not on file  . Transportation needs:    Medical: Not on file    Non-medical: Not on file  Tobacco Use  . Smoking status: Never Smoker  . Smokeless tobacco: Never Used  Substance and Sexual Activity  . Alcohol use: No  . Drug use: No  . Sexual activity: Not on file  Lifestyle  . Physical activity:    Days per week: Not on file    Minutes per session: Not on file  . Stress: Not on file  Relationships  . Social connections:    Talks on phone: Not on file    Gets together: Not on file    Attends religious service: Not on file    Active member of club or organization: Not on file    Attends meetings of clubs or organizations: Not on file    Relationship status: Not on file  . Intimate partner violence:    Fear of current or ex partner: Not on file    Emotionally abused: Not on file    Physically abused: Not on file    Forced sexual activity: Not on file  Other Topics Concern  . Not on file  Social History Narrative  . Not on file    Family History  Problem Relation Age of Onset  . Cancer Brother        colon cancer    Review of Systems:  As stated in the HPI and otherwise negative.   BP 118/64   Pulse 70   Ht 6\' 2"  (1.88 m)   Wt 262 lb (118.8 kg)   SpO2 95%   BMI 33.64 kg/m   Physical Examination:  General: Well developed, well nourished, NAD  HEENT: OP clear, mucus membranes moist  SKIN: warm, dry. No rashes. Neuro: No focal deficits  Musculoskeletal: Muscle strength 5/5 all ext  Psychiatric: Mood and affect normal  Neck: No JVD, no carotid bruits, no thyromegaly, no lymphadenopathy.  Lungs:Clear bilaterally, no wheezes, rhonci, crackles Cardiovascular: Regular rate and rhythm. No murmurs, gallops or rubs. Abdomen:Soft. Bowel sounds present. Non-tender.  Extremities: No lower  extremity edema. Pulses are 2 + in the bilateral DP/PT.  Echo 01/04/18: - Left ventricle: The cavity size was normal. Systolic function was   normal. The estimated ejection fraction was 55%. Wall motion was   normal; there were no regional wall motion abnormalities. - Aortic valve: Not well visualized cannot r/o bicuspid valve - Atrial septum: No defect or patent foramen ovale was identified.  EKG:  EKG is not ordered today. The ekg ordered today demonstrates   Recent Labs: 06/10/2018: ALT 18; BUN 31; Creatinine, Ser 1.97; Hemoglobin 13.6; Platelets 298;  Potassium 5.4; Sodium 140   Lipid Panel Followed in primary care   Wt Readings from Last 3 Encounters:  01/09/19 262 lb (118.8 kg)  06/10/18 255 lb (115.7 kg)  12/22/17 258 lb 6.4 oz (117.2 kg)     Other studies Reviewed: Additional studies/ records that were reviewed today include: . Review of the above records demonstrates:   Assessment and Plan:   1. CAD without angina: No chest pain. He has not been on ASA due to GI upset in the past. Continue Plavix, statin and beta blocker.    2. HYPERTENSION: BP is well controlled. No changes.    3. Hyperlipidemia: Lipids followed in primary care. Continue statin.   4. History of Pulmonary embolism: This was following a GI surgery in 2014. He has been off of coumadin.   Current medicines are reviewed at length with the patient today.  The patient does not have concerns regarding medicines.  The following changes have been made:  no change  Labs/ tests ordered today include:   No orders of the defined types were placed in this encounter.   Disposition:   FU with me in 12  months  Signed, Verne Carrow, MD 01/09/2019 9:06 AM    [facility] [address] [place], [place], [place]  [address] Phone: [phone]; Fax: [phone]

## 2019-01-09 ENCOUNTER — Ambulatory Visit: Payer: Medicare Other | Admitting: Cardiovascular Disease

## 2019-01-09 ENCOUNTER — Encounter: Payer: Self-pay | Admitting: Cardiovascular Disease

## 2019-01-09 VITALS — BP 118/64 | HR 70 | Ht 74.0 in | Wt 262.0 lb

## 2019-01-09 DIAGNOSIS — I2510 Atherosclerotic heart disease of native coronary artery without angina pectoris: Secondary | ICD-10-CM | POA: Diagnosis not present

## 2019-01-09 DIAGNOSIS — I10 Essential (primary) hypertension: Secondary | ICD-10-CM | POA: Diagnosis not present

## 2019-01-09 DIAGNOSIS — E7800 Pure hypercholesterolemia, unspecified: Secondary | ICD-10-CM | POA: Diagnosis not present

## 2019-01-09 NOTE — Patient Instructions (Signed)

## 2019-01-27 ENCOUNTER — Other Ambulatory Visit: Payer: Self-pay | Admitting: Cardiovascular Disease

## 2019-06-29 ENCOUNTER — Encounter: Payer: Self-pay | Admitting: Neurology

## 2019-06-29 ENCOUNTER — Other Ambulatory Visit: Payer: Self-pay

## 2019-06-29 ENCOUNTER — Ambulatory Visit (INDEPENDENT_AMBULATORY_CARE_PROVIDER_SITE_OTHER): Payer: Medicare Other | Admitting: Neurology

## 2019-06-29 VITALS — BP 157/81 | HR 73 | Ht 74.0 in | Wt 257.0 lb

## 2019-06-29 DIAGNOSIS — F515 Nightmare disorder: Secondary | ICD-10-CM | POA: Diagnosis not present

## 2019-06-29 DIAGNOSIS — R251 Tremor, unspecified: Secondary | ICD-10-CM | POA: Diagnosis not present

## 2019-06-29 DIAGNOSIS — R6889 Other general symptoms and signs: Secondary | ICD-10-CM | POA: Diagnosis not present

## 2019-06-29 DIAGNOSIS — G4752 REM sleep behavior disorder: Secondary | ICD-10-CM

## 2019-06-29 NOTE — Patient Instructions (Addendum)
You have an intermittent tremor of both hands.  I do not see any signs or symptoms of parkinson's like disease or what we call parkinsonism. Your neurological exam looks good.  For your tremor, I would not recommend any new medication for fear of side effects (especially sleepiness) or medication interactions, especially in light of The mild and intermittent nature of your tremor.  I would like to reevaluate you in about 6 months.     Please remember, that any kind of tremor may be exacerbated by anxiety, anger, nervousness, excitement, dehydration, sleep deprivation, by caffeine, and low blood sugar values or blood sugar fluctuations. Some medications can exacerbate tremors.   I would recommend we proceed with a sleep study to rule out underlying obstructive sleep apnea and to look at your sleep disorder including dream enactment behavior.   Please consider the sleep study and let me know.

## 2019-06-29 NOTE — Progress Notes (Signed)
Subjective:    Patient ID: Timothy [name] is a 78 y.o. male.  HPI     Star Age, MD, PhD [facility] [address] [place], [place] [address]  Dear Dr. Arelia Sneddon, I saw your patient, Timothy [name], upon your kind request in my neurologic clinic today for initial consultation of his tremors, concern for parkinsonism.  The patient is accompanied by his older daughter, Timothy [name], today.  As you know, Timothy [name] is an 78 year old right-handed gentleman with an underlying medical history of diabetes, elevated creatinine level, hypertension, hyperlipidemia, coronary artery disease with history of MI, and obesity, who reports an approximately 35-month history of bilateral upper extremity tremors, more noticeable in the right hand.  He has had an intermittent tremor, no obvious resting tremor, mostly with handwriting and with holding something such as a spoon.  He is not particularly bothered by the tremor.  He has also noticed changes in his handwriting which has become more sloppy and tremulous.  He recalls that his brother had hand tremors or at least one hand was trembling.  Brother was 34 years older and died at age 11 from cancer.  He had a total of 4 brothers, no sisters, no family history of Parkinson's disease, no other family history of tremors.  He has 2 daughters.  He lives with his wife, Timothy [name] lives close by.  He is very active physically, mows the lawn, maintains the farm.  He reports that he has lost his sense of smell in the past 6 months.  He has a longer standing history of vivid dreams and has a history of sleep talking, but in the past 6 months he has had some dream enactment behavior, acting out in his dreams with moving and kicking.  One time about 2 to 3 months ago he fell out of bed, thankfully without injuries.  His memory is good, he has had no falls.  He has no bowel or bladder complaints with the exception of diarrhea.  He has a history of  partial colectomy many years ago, he has had no cancer.  He has been placed on additional fiber, and this has helped his diarrhea.  He had recent blood work in your office which I reviewed. I reviewed your office note from 03/16/2019, which you kindly included.  His Past Medical History Is Significant For: Past Medical History:  Diagnosis Date  . CAD (coronary artery disease)   . Coronary artery disease    NSTEMI 2001, 2003   . Diabetes mellitus   . History of MI (myocardial infarction)   . Hyperlipidemia   . Hypertension   . Inguinal hernia   . Syncope, carotid sinus     His Past Surgical History Is Significant For: Past Surgical History:  Procedure Laterality Date  . APPENDECTOMY    . CARDIAC CATHETERIZATION  00,03   stents both times  . COLON SURGERY    . COLONOSCOPY    . COLONOSCOPY N/A 07/06/2013   Procedure: COLONOSCOPY;  Surgeon: Winfield Cunas., MD;  Location: [facility] END[facility]COPY;  Service: Endoscopy;  Laterality: N/A;  Tattoo,clips,bicap available  . ESOPHAGOGASTRODUODENOSCOPY N/A 07/06/2013   Procedure: ESOPHAGOGASTRODUODENOSCOPY (EGD);  Surgeon: Winfield Cunas., MD;  Location: [facility]COPY;  Service: Endoscopy;  Laterality: N/A;  at bedside  . EYE SURGERY  2013   Cataracts-both  . INGUINAL HERNIA REPAIR Right 06/29/2013   Procedure: HERNIA REPAIR INGUINAL ADULT;  Surgeon: Earnstine Regal, MD;  Location: [facility]  ;  Service: General;  Laterality: Right;  . INSERTION OF MESH Right 06/29/2013   Procedure: INSERTION OF MESH;  Surgeon: Velora Hecklerodd M Gerkin, MD;  Location: [facility];  Service: General;  Laterality: Right;  . LAPAROTOMY Right 07/06/2013   Procedure: EXPLORATORY LAPAROTOMY;  Surgeon: Shelly Rubensteinouglas A Blackman, MD;  Location: [facility] OR;  Service: General;  Laterality: Right;  . None    . ROTATOR CUFF REPAIR  2013   lt    His Family History Is Significant For: Family History  Problem Relation Age of Onset  . Cancer Brother        colon cancer     His Social History Is Significant For: Social History   Socioeconomic History  . Marital status: Married    Spouse name: Not on file  . Number of children: Not on file  . Years of education: Not on file  . Highest education level: Not on file  Occupational History  . Not on file  Social Needs  . Financial resource strain: Not on file  . Food insecurity    Worry: Not on file    Inability: Not on file  . Transportation needs    Medical: Not on file    Non-medical: Not on file  Tobacco Use  . Smoking status: Never Smoker  . Smokeless tobacco: Never Used  Substance and Sexual Activity  . Alcohol use: No  . Drug use: No  . Sexual activity: Not on file  Lifestyle  . Physical activity    Days per week: Not on file    Minutes per session: Not on file  . Stress: Not on file  Relationships  . Social Musicianconnections    Talks on phone: Not on file    Gets together: Not on file    Attends religious service: Not on file    Active member of club or organization: Not on file    Attends meetings of clubs or organizations: Not on file    Relationship status: Not on file  Other Topics Concern  . Not on file  Social History Narrative  . Not on file    His Allergies Are:  No Known Allergies:   His Current Medications Are:  Outpatient Encounter Medications as of 06/29/2019  Medication Sig  . clopidogrel (PLAVIX) 75 MG tablet TAKE 1 TABLET BY MOUTH  DAILY.  Marland Kitchen. insulin NPH-regular Human (NOVOLIN 70/30) (70-30) 100 UNIT/ML injection Inject into the skin.  Marland Kitchen. loratadine (CLARITIN) 10 MG tablet Take 10 mg by mouth daily as needed for allergies.   . metFORMIN (GLUCOPHAGE) 1000 MG tablet Take 1,000 mg by mouth 2 (two) times daily with a meal.   . metoprolol tartrate (LOPRESSOR) 25 MG tablet TAKE 1 TABLET BY MOUTH 2  TIMES DAILY.  . pravastatin (PRAVACHOL) 40 MG tablet TAKE 1 TABLET BY MOUTH  EVERY EVENING.  . ramipril (ALTACE) 10 MG capsule TAKE 1 CAPSULE BY MOUTH  DAILY.   No  facility-administered encounter medications on file as of 06/29/2019.   :   Review of Systems:  Out of a complete 14 point review of systems, all are reviewed and negative with the exception of these symptoms as listed below:  Review of Systems  Neurological:       Pt presents today to discuss if he has PD. He has noticed that his hands shake and his writing has changed. He has lost his sense of smell. He has vivid dreams and sometimes acts them out.  Pt is right handed but considers himself ambidextrous.    Objective:  Neurological Exam  Physical Exam Physical Examination:   Vitals:   06/29/19 1441  BP: (!) 157/81  Pulse: 73    General Examination: The patient is a very pleasant 78 y.o. male in no acute distress. He appears well-developed and well-nourished and well groomed.   HEENT: Normocephalic, atraumatic, pupils are equal, round and reactive to light and accommodation. He is status post bilateral cataract repairs.Extraocular tracking is good without limitation to gaze excursion or nystagmus noted. Normal smooth pursuit is noted. Hearing is Mildly impaired. Face is symmetric with normal facial animation. Speech is clear with no dysarthria noted. There is no hypophonia. There is no lip, neck/head, jaw or voice tremor. Neck is supple with full range of passive and active motion. There are no carotid bruits on auscultation. Oropharynx exam reveals: moderate mouth dryness, adequate dental hygiene and moderate airway crowding, due to Small airway entry, redundant soft palate, tonsils of 1-2+ bilaterally. Tongue protrudes centrally in palate elevates symmetrically.  Chest: Clear to auscultation without wheezing, rhonchi or crackles noted.  Heart: S1+S2+0, regular and normal without murmurs, rubs or gallops noted.   Abdomen: Soft, non-tender and non-distended with normal bowel sounds appreciated on auscultation.  Extremities: There is trace pitting edema in the distal lower extremities  bilaterally.   Skin: Warm and dry without trophic changes noted.  Musculoskeletal: exam reveals no obvious joint deformities, tenderness or joint swelling or erythema.   Neurologically:  Mental status: The patient is awake, alert and oriented in all 4 spheres. His immediate and remote memory, attention, language skills and fund of knowledge are appropriate. There is no evidence of aphasia, agnosia, apraxia or anomia. Speech is clear with normal prosody and enunciation. Thought process is linear. Mood is normal and affect is normal.  Cranial nerves II - XII are as described above under HEENT exam. In addition: shoulder shrug is normal with equal shoulder height noted. Motor exam: Normal bulk, strength and tone is noted. There is no drift, tremor or rebound.  On 06/29/2019: On Archimedes spiral drawing, he has mild insecurity with the right hand with intermittent trembling, more insecurity but no trembling with the left hand, handwriting is tremulous, not particularly micrographic, legible.He has no resting tremor.  He has no obvious postural or action tremor, no intention tremor. Romberg is negative. Reflexes are 2+ in the upper extremities, 1+ in the knees, absent in the ankles.  Fine motor skills and coordination: intact with normal finger taps, normal hand movements, normal rapid alternating patting, normal foot taps and normal foot agility.  Cerebellar testing: No dysmetria or intention tremor on finger to nose testing. There is no truncal or gait ataxia.  Sensory exam: intact to light touch in the upper and lower extremities.  Gait, station and balance: He stands With very mild difficulty and pushes himself up.  He stands slightly wider based.  Posture is age-appropriate.  He walks with fairly good stride length and pace, preserved arm swing bilaterally.  He turns with slight insecurity.   Assessment and Plan:   Assessment and Plan:  In summary, Timothy [name] is a very pleasant 78 y.o.-year  old male with an underlying medical history of diabetes, elevated creatinine level, hypertension, hyperlipidemia, coronary artery disease with history of MI, and obesity, who Presents for evaluation of his hand tremor.  He has also noted changes in his handwriting and also reports history of dream enactment behavior.  He has a  longer standing history of vivid dreams.  He has a family history of tremors, his older brother had hand tremors or at least a hand tremor.  He has no family history of Parkinson's disease.  His examination Did not show any telltale signs of parkinsonism.  The patient and his daughter were reassured in that regard.  He did not have much in the way of tremor on exam today.  He reports that his tremor has been intermittent.  He may have a mild form of essential tremor or a starting of essential tremor.  He is encouraged to monitor his symptoms.  He is encouraged to stay well-hydrated, well rested and continue with physical and mental activity.  He is quite active on a day-to-day basis.  I did not suggest any symptomatic treatment for his tremor because of the mild nature and intermittent nature of his tremor.  We will continue to monitor his exam.  For diagnosis and better clarification of his sleep disorder, I did suggest we proceed with a sleep study, particularly to evaluate him for REM behavior disorder but also to rule out sleep disordered breathing.  He is reported to snore and he does have a crowded appearing airway, in addition, his BMI is in the mildly obese range. He is not ready to schedule a sleep study quite yet.  He would like to think about it.  His neurological exam overall is quite benign today.  We will reevaluate things in about 6 months, sooner if needed.  They are encouraged to call for any interim questions or concerns or should he decide to proceed with a sleep study he can call our office any time.  I answered all their questions today and the patient and his daughter  were in agreement.  Thank you very much for allowing me to participate in the care of this nice patient. If I can be of any further assistance to you please do not hesitate to call me at [phone].  Sincerely,   Huston FoleySaima Marquarius Lofton, MD, PhD

## 2019-07-06 ENCOUNTER — Telehealth: Payer: Self-pay | Admitting: Cardiovascular Disease

## 2019-07-06 NOTE — Telephone Encounter (Signed)
   [facility] Pre-operative Risk Assessment    Request for surgical clearance:  1. What type of surgery is being performed? Tooth extraction, fillings, crowns, bridges   2. When is this surgery scheduled? TBD   3. What type of clearance is required (medical clearance vs. Pharmacy clearance to hold med vs. Both)? Both  4. Are there any medications that need to be held prior to surgery and how long? Plavix   5. Practice name and name of physician performing surgery? [facility]   6. What is your office phone number? [phone]    7.   What is your office fax number? [phone]  8.   Anesthesia type (None, local, MAC, general) ? Local    _________________________________________________________________   (provider comments below)

## 2019-07-06 NOTE — Telephone Encounter (Signed)
   Primary Cardiologist: Lauree Chandler, MD  Chart reviewed as part of pre-operative protocol coverage, appears to be a generalized pre-emptive clearance. Simple dental extractions and simple local restorative procedures are considered low risk procedures per guidelines and generally do not require any specific cardiac clearance. It is also generally accepted that for simple extractions, dental cleanings, and minor restorative dentistry procedures such as fillings/crowns there is no need to interrupt blood thinner therapy. If the patient is going to require surgical dentistry, >3 tooth extractions, or a more invasive procedure with severe bleeding risk, please contact our office for further instructions.  SBE prophylaxis is not required for the patient.  I will route this recommendation to the requesting party via Epic fax function and remove from pre-op pool.  Please call with questions.  Charlie Pitter, PA-C 07/06/2019, 2:37 PM

## 2019-07-07 NOTE — Telephone Encounter (Signed)
OK to hold Plavix.   Timothy [name]  

## 2019-07-07 NOTE — Telephone Encounter (Signed)
   Primary Cardiologist: Lauree Chandler, MD  Chart reviewed as part of pre-operative protocol coverage. Given past medical history and time since last visit, based on ACC/AHA guidelines, Timothy [name] would be at acceptable risk for the planned procedure without further cardiovascular testing.   Ok to hold Plavix 7 days prior to dental work.   I will route this recommendation to the requesting party via Epic fax function and remove from pre-op pool.  Please call with questions.  Lyda Jester, PA-C 07/07/2019, 10:09 AM

## 2019-07-07 NOTE — Telephone Encounter (Addendum)
Call back received from dental office. Pt is having extensive invasive procedure with potential bleeding risk.  Plan is for:  5 extractions, 1 crown, 2 fillings, 2 dental implants and bone grafting  Please review Plavix orders for this procedure.

## 2019-07-17 ENCOUNTER — Telehealth: Payer: Self-pay | Admitting: Cardiovascular Disease

## 2019-07-17 NOTE — Telephone Encounter (Signed)
Follow Up:    Timothy [name] said she called on 07-07-19 and talked to a nurse. She told the nurse she need a more detailed clarence  For pt's invasive dental work.. Pt is on Plavxx and have stents. Please fax this  asap to [phone].

## 2019-07-18 NOTE — Telephone Encounter (Signed)
Spoke with Junie Panning from [place]. She stated she has not received fax yet, but will be on the look out for it and let me know if she has not received it.

## 2019-07-18 NOTE — Telephone Encounter (Signed)
I don't understand the question. Please call and clarify their request. Also, fax number isn't complete.

## 2019-07-18 NOTE — Telephone Encounter (Signed)
Looks like they may not have received clearance from [place], [place] and Dr. Clydene Fake. Will resend to to requesting office via [place] fax. Will call office to make sure received.

## 2019-12-08 ENCOUNTER — Other Ambulatory Visit: Payer: Self-pay | Admitting: Cardiovascular Disease

## 2020-01-01 ENCOUNTER — Ambulatory Visit: Payer: Medicare Other | Admitting: Neurology

## 2020-01-02 ENCOUNTER — Ambulatory Visit: Payer: Medicare Other

## 2020-01-11 ENCOUNTER — Ambulatory Visit: Payer: Medicare Other

## 2020-01-15 ENCOUNTER — Other Ambulatory Visit: Payer: Self-pay

## 2020-01-15 ENCOUNTER — Ambulatory Visit (INDEPENDENT_AMBULATORY_CARE_PROVIDER_SITE_OTHER): Payer: Medicare Other | Admitting: Cardiovascular Disease

## 2020-01-15 ENCOUNTER — Encounter: Payer: Self-pay | Admitting: Cardiovascular Disease

## 2020-01-15 VITALS — BP 134/74 | HR 65 | Ht 74.0 in | Wt 256.0 lb

## 2020-01-15 DIAGNOSIS — I2510 Atherosclerotic heart disease of native coronary artery without angina pectoris: Secondary | ICD-10-CM

## 2020-01-15 DIAGNOSIS — E7800 Pure hypercholesterolemia, unspecified: Secondary | ICD-10-CM | POA: Diagnosis not present

## 2020-01-15 DIAGNOSIS — I10 Essential (primary) hypertension: Secondary | ICD-10-CM

## 2020-01-15 NOTE — Patient Instructions (Signed)

## 2020-01-15 NOTE — Progress Notes (Signed)
Chief Complaint  Patient presents with  . Follow-up    CAD   History of Present Illness: 79 yo male with a history of DM, HTN, HLD, coronary artery disease status post non-ST elevation myocardial infarction in 2001 and 2003 and PE in 2014 who is here today for cardiac follow up. Cardiac history includes bare-metal stent placement in the RCA in 2001 and Cypher drug-eluting stent placement in the circumflex artery in 2003. Stress myoview February 2011 with LVEF of 55%. No ischemia.  Patient underwent inguinal hernia repair in July of 2014 complicated by a diverticular bleed requiring right partial colectomy. His Plavix was stopped and then he developed a pulmonary embolus in August 2014 for which he was treated with Coumadin. He has since completed his coumadin therapy and is back on Plavix. Echo January 2019 with LVEF=55%.     He is here today for follow up. The patient denies any chest pain, dyspnea, palpitations, lower extremity edema, orthopnea, PND, dizziness, near syncope or syncope.   He enjoys restoring older VW Beetles.   Primary Care Physician: [name], MD  Past Medical History:  Diagnosis Date  . CAD (coronary artery disease)   . Coronary artery disease    NSTEMI 2001, 2003   . Diabetes mellitus   . History of MI (myocardial infarction)   . Hyperlipidemia   . Hypertension   . Inguinal hernia   . Syncope, carotid sinus     Past Surgical History:  Procedure Laterality Date  . APPENDECTOMY    . CARDIAC CATHETERIZATION  00,03   stents both times  . COLON SURGERY    . COLONOSCOPY    . COLONOSCOPY N/A 07/06/2013   Procedure: COLONOSCOPY;  Surgeon: [name], MD;  Location: [facility] ENDOSCOPY;  Service: Endoscopy;  Laterality: N/A;  Tattoo,clips,bicap available  . ESOPHAGOGASTRODUODENOSCOPY N/A 07/06/2013   Procedure: ESOPHAGOGASTRODUODENOSCOPY (EGD);  Surgeon: [name], MD;  Location[facility] ENDOSCOPY;  Service: Endoscopy;  Laterality: N/A;  at  bedside  . EYE SURGERY  2013   Cataracts-both  . INGUINAL HERNIA REPAIR Right 06/29/2013   Procedure: HERNIA REPAIR INGUINAL ADULT;  Surgeon: [name], MD;  Location: [facility];  Service: General;  Laterality: Right;  . INSERTION OF MESH Right 06/29/2013   Procedure: INSERTION OF MESH;  Surgeon: [name], MD;  Location: [facility];  Service: General;  Laterality: Right;  . LAPAROTOMY Right 07/06/2013   Procedure: EXPLORATORY LAPAROTOMY;  Surgeon: [name], MD;  Location: [facility] OR;  Service: General;  Laterality: Right;  . None    . ROTATOR CUFF REPAIR  2013   lt    Current Outpatient Medications  Medication Sig Dispense Refill  . clopidogrel (PLAVIX) 75 MG tablet TAKE 1 TABLET BY MOUTH  DAILY 90 tablet 0  . insulin NPH-regular Human (NOVOLIN 70/30) (70-30) 100 UNIT/ML injection Inject into the skin.    Marland Kitchen loratadine (CLARITIN) 10 MG tablet Take 10 mg by mouth daily as needed for allergies.     . metFORMIN (GLUCOPHAGE) 1000 MG tablet Take 1,000 mg by mouth 2 (two) times daily with a meal.     . metoprolol tartrate (LOPRESSOR) 25 MG tablet TAKE 1 TABLET BY MOUTH TWO  TIMES DAILY 180 tablet 0  . pravastatin (PRAVACHOL) 40 MG tablet TAKE 1 TABLET BY MOUTH  EVERY EVENING 90 tablet 0  . ramipril (ALTACE) 10 MG capsule TAKE 1 CAPSULE BY MOUTH  DAILY 90 capsule 0  No current facility-administered medications for this visit.    No Known Allergies  Social History   Socioeconomic History  . Marital status: Married    Spouse name: Not on file  . Number of children: Not on file  . Years of education: Not on file  . Highest education level: Not on file  Occupational History  . Not on file  Tobacco Use  . Smoking status: Never Smoker  . Smokeless tobacco: Never Used  Substance and Sexual Activity  . Alcohol use: No  . Drug use: No  . Sexual activity: Not on file  Other Topics Concern  . Not on file  Social History Narrative  . Not on file    Social Determinants of Health   Financial Resource Strain:   . Difficulty of Paying Living Expenses: Not on file  Food Insecurity:   . Worried About Charity fundraiser in the Last Year: Not on file  . Ran Out of Food in the Last Year: Not on file  Transportation Needs:   . Lack of Transportation (Medical): Not on file  . Lack of Transportation (Non-Medical): Not on file  Physical Activity:   . Days of Exercise per Week: Not on file  . Minutes of Exercise per Session: Not on file  Stress:   . Feeling of Stress : Not on file  Social Connections:   . Frequency of Communication with Friends and Family: Not on file  . Frequency of Social Gatherings with Friends and Family: Not on file  . Attends Religious Services: Not on file  . Active Member of Clubs or Organizations: Not on file  . Attends Archivist Meetings: Not on file  . Marital Status: Not on file  Intimate Partner Violence:   . Fear of Current or Ex-Partner: Not on file  . Emotionally Abused: Not on file  . Physically Abused: Not on file  . Sexually Abused: Not on file    Family History  Problem Relation Age of Onset  . Cancer Brother        colon cancer    Review of Systems:  As stated in the HPI and otherwise negative.   BP 134/74   Pulse 65   Ht 6\' 2"  (1.88 m)   Wt 256 lb (116.1 kg)   SpO2 96%   BMI 32.87 kg/m   Physical Examination:  General: Well developed, well nourished, NAD  HEENT: OP clear, mucus membranes moist  SKIN: warm, dry. No rashes. Neuro: No focal deficits  Musculoskeletal: Muscle strength 5/5 all ext  Psychiatric: Mood and affect normal  Neck: No JVD, no carotid bruits, no thyromegaly, no lymphadenopathy.  Lungs:Clear bilaterally, no wheezes, rhonci, crackles Cardiovascular: Regular rate and rhythm. No murmurs, gallops or rubs. Abdomen:Soft. Bowel sounds present. Non-tender.  Extremities: No lower extremity edema. Pulses are 2 + in the bilateral DP/PT.  Echo 01/04/18: -  Left ventricle: The cavity size was normal. Systolic function was   normal. The estimated ejection fraction was 55%. Wall motion was   normal; there were no regional wall motion abnormalities. - Aortic valve: Not well visualized cannot r/o bicuspid valve - Atrial septum: No defect or patent foramen ovale was identified.  EKG:  EKG is ordered today. The ekg ordered today demonstrates NSR, rate 64 bpm  Recent Labs: No results found for requested labs within last 8760 hours.   Lipid Panel Followed in primary care   Wt Readings from Last 3 Encounters:  01/15/20 256 lb (116.1  kg)  06/29/19 257 lb (116.6 kg)  01/09/19 262 lb (118.8 kg)     Other studies Reviewed: Additional studies/ records that were reviewed today include: . Review of the above records demonstrates:   Assessment and Plan:   1. CAD without angina: He has no chest pain. He has not been on ASA due to GI upset in the past. Will continue Plavix, beta blocker and statin.      2. HYPERTENSION: BP is controlled.   3. Hyperlipidemia: Lipids followed in primary care. He tells me that he is getting in for a lipid profile in primary care later this month. Continue statin  4. History of Pulmonary embolism: This was following a GI surgery in 2014. He has been off of coumadin.   Current medicines are reviewed at length with the patient today.  The patient does not have concerns regarding medicines.  The following changes have been made:  no change  Labs/ tests ordered today include:   Orders Placed This Encounter  Procedures  . EKG 12-Lead    Disposition:   FU with me in 12  months  Signed, Verne Carrow, MD 01/15/2020 5:20 PM    [facility] [address] [place], [place], [place]  [address] Phone: [phone]; Fax: [phone]

## 2020-01-18 ENCOUNTER — Ambulatory Visit: Payer: Medicare Other

## 2020-04-27 ENCOUNTER — Other Ambulatory Visit: Payer: Self-pay | Admitting: Cardiovascular Disease

## 2020-04-29 MED ORDER — PRAVASTATIN SODIUM 40 MG PO TABS: 40.0000 mg | ORAL_TABLET | Freq: Every evening | ORAL | 2 refills | Status: DC

## 2020-04-29 MED ORDER — RAMIPRIL 10 MG PO CAPS: 10.0000 mg | ORAL_CAPSULE | Freq: Every day | ORAL | 2 refills | Status: DC

## 2020-04-29 MED ORDER — METOPROLOL TARTRATE 25 MG PO TABS: 25.0000 mg | ORAL_TABLET | Freq: Two times a day (BID) | ORAL | 2 refills | Status: DC

## 2020-04-29 MED ORDER — CLOPIDOGREL BISULFATE 75 MG PO TABS: 75.0000 mg | ORAL_TABLET | Freq: Every day | ORAL | 2 refills | Status: DC

## 2020-07-29 ENCOUNTER — Ambulatory Visit: Payer: Medicare Other | Admitting: Podiatry

## 2020-07-29 ENCOUNTER — Other Ambulatory Visit: Payer: Self-pay

## 2020-07-29 DIAGNOSIS — E11621 Type 2 diabetes mellitus with foot ulcer: Secondary | ICD-10-CM | POA: Diagnosis not present

## 2020-07-29 DIAGNOSIS — M2012 Hallux valgus (acquired), left foot: Secondary | ICD-10-CM

## 2020-07-29 DIAGNOSIS — M2011 Hallux valgus (acquired), right foot: Secondary | ICD-10-CM | POA: Diagnosis not present

## 2020-07-29 DIAGNOSIS — M2041 Other hammer toe(s) (acquired), right foot: Secondary | ICD-10-CM

## 2020-07-29 DIAGNOSIS — S93125A Dislocation of metatarsophalangeal joint of left lesser toe(s), initial encounter: Secondary | ICD-10-CM

## 2020-07-29 DIAGNOSIS — M21611 Bunion of right foot: Secondary | ICD-10-CM

## 2020-07-29 DIAGNOSIS — L97522 Non-pressure chronic ulcer of other part of left foot with fat layer exposed: Secondary | ICD-10-CM | POA: Diagnosis not present

## 2020-07-29 DIAGNOSIS — L97509 Non-pressure chronic ulcer of other part of unspecified foot with unspecified severity: Secondary | ICD-10-CM

## 2020-07-29 DIAGNOSIS — M2042 Other hammer toe(s) (acquired), left foot: Secondary | ICD-10-CM

## 2020-07-29 DIAGNOSIS — M21612 Bunion of left foot: Secondary | ICD-10-CM

## 2020-07-29 MED ORDER — MUPIROCIN 2 % EX OINT: 1.0000 "application " | TOPICAL_OINTMENT | Freq: Every day | CUTANEOUS | 2 refills | Status: DC

## 2020-07-29 NOTE — Patient Instructions (Signed)
Monitor for any signs/symptoms of infection. Signs of an infection could be redness beyond the site of the incision/procedure/wound, foul smelling odor, drainage that is thick and yellow or green, or severe swelling and pain. Call the office immediately if any occur or go directly to the emergency room. Call with any questions/concerns.   Change the dressing daily with a small amount of mupirocin ointment, and then apply the foam border dressing (such as Mepilex or Allevyn)

## 2020-07-31 NOTE — Progress Notes (Signed)
  Subjective:  Patient ID: Timothy [name], male    DOB: November 02, 1941,  MRN: [id number]  Chief Complaint  Patient presents with  . Wound Check    L plantar midfoot submet 2. Pt stated, "This started 2-3 years ago with a callus. It opened and I developed a wound with an infection. It resolved and healed, but the callus opened again about 6 months ago. Pain = 2-3/10 most of the time. No pus/odor/fever/chills/N&V. Some bleeding. I soak in Epsom salt occasionally. I apply Neosporin and cover the wound".  . Diabetes    Most recent HgbA1c per pt = 7.0. No known neuropathy.    79 y.o. male presents with the above complaint. History confirmed with patient.   Objective:  Physical Exam: warm, good capillary refill, normal DP and PT pulses, reduced sensation at tips of toes and plantar foot bilaterally and ulceration at left foot submet 2.  Bilaterally there is severe hallux valgus and digital contractures, the worst of which is the left foot with a and overlapping second toe and likely dislocation of the second metatarsophalangeal joint.  He has an ulceration revealed after debridement of the hyperkeratosis measuring 3 mm x 6 mm x 2 mm submet 2, no cellulitis, no purulence, no malodor, no signs of infection.  Extends into dermis and subcutaneous tissue.     Radiographs: Not currently indicated, we will obtain these if his condition worsens Assessment:   1. Ulcer of left foot with fat layer exposed (HCC)   2. Type 2 diabetes mellitus with foot ulcer, without long-term current use of insulin (HCC)   3. Hallux valgus with bunions, left   4. Hallux valgus with bunions, right   5. Hammertoe of left foot   6. Hammertoe of right foot   7. Dislocation of metatarsophalangeal joint of lesser toe of left foot, initial encounter      Plan:  Patient was evaluated and treated and all questions answered.  Ulcer left submet 2 -Debridement as below. -Dressed with Iodosorb, DSD. -He brought his own surgical  shoe today which he had from previous treatment.  We applied a peg assist device to this and he will wear this to offload the wound -Continue off-loading with surgical shoe.  Procedure: Excisional Debridement of Wound Rationale: Removal of non-viable soft tissue from the wound to promote healing.  Anesthesia: none Pre-Debridement Wound Measurements: Covered with hyperkeratosis Post-Debridement Wound Measurements: 0.3 cm x 0.6 cm x 0.2 cm  Type of Debridement: Sharp Excisional Tissue Removed: Non-viable soft tissue Depth of Debridement: subcutaneous tissue. Technique: Sharp excisional debridement to bleeding, viable wound base.  Dressing: Dry, sterile, compression dressing. Disposition: Patient tolerated procedure well.   Return in about 3 weeks (around 08/19/2020) for wound re-check.

## 2020-08-26 ENCOUNTER — Other Ambulatory Visit: Payer: Self-pay

## 2020-08-26 ENCOUNTER — Ambulatory Visit (INDEPENDENT_AMBULATORY_CARE_PROVIDER_SITE_OTHER): Payer: Medicare Other | Admitting: Podiatry

## 2020-08-26 DIAGNOSIS — L97509 Non-pressure chronic ulcer of other part of unspecified foot with unspecified severity: Secondary | ICD-10-CM

## 2020-08-26 DIAGNOSIS — M2011 Hallux valgus (acquired), right foot: Secondary | ICD-10-CM

## 2020-08-26 DIAGNOSIS — M21611 Bunion of right foot: Secondary | ICD-10-CM

## 2020-08-26 DIAGNOSIS — M21612 Bunion of left foot: Secondary | ICD-10-CM

## 2020-08-26 DIAGNOSIS — M2042 Other hammer toe(s) (acquired), left foot: Secondary | ICD-10-CM | POA: Diagnosis not present

## 2020-08-26 DIAGNOSIS — S93125A Dislocation of metatarsophalangeal joint of left lesser toe(s), initial encounter: Secondary | ICD-10-CM

## 2020-08-26 DIAGNOSIS — M2012 Hallux valgus (acquired), left foot: Secondary | ICD-10-CM | POA: Diagnosis not present

## 2020-08-26 DIAGNOSIS — L97522 Non-pressure chronic ulcer of other part of left foot with fat layer exposed: Secondary | ICD-10-CM

## 2020-08-26 DIAGNOSIS — E11621 Type 2 diabetes mellitus with foot ulcer: Secondary | ICD-10-CM

## 2020-08-26 DIAGNOSIS — M2041 Other hammer toe(s) (acquired), right foot: Secondary | ICD-10-CM

## 2020-08-26 NOTE — Progress Notes (Signed)
  Subjective:  Patient ID: Timothy [name], male    DOB: Jan 06, 1941,  MRN: [id number]  Chief Complaint  Patient presents with  . Wound Check    3wk wound check. No drainages, no fever, no pain     79 y.o. male returns with the above complaint. History confirmed with patient.   Objective:  Physical Exam: warm, good capillary refill, normal DP and PT pulses, reduced sensation at tips of toes and plantar foot bilaterally and ulceration at left foot submet 2.  Bilaterally there is severe hallux valgus and digital contractures, the worst of which is the left foot with a and overlapping second toe and likely dislocation of the second metatarsophalangeal joint.  He has an ulceration revealed after debridement of the hyperkeratosis measuring 4 mm x 4 mm x 2 mm submet 2, no cellulitis, no purulence, no malodor, no signs of infection.  Extends into dermis and subcutaneous tissue.       Radiographs: Not currently indicated, we will obtain these if his condition worsens Assessment:   1. Ulcer of left foot with fat layer exposed (HCC)   2. Type 2 diabetes mellitus with foot ulcer, without long-term current use of insulin (HCC)   3. Hallux valgus with bunions, left   4. Hallux valgus with bunions, right   5. Hammertoe of left foot   6. Hammertoe of right foot   7. Dislocation of metatarsophalangeal joint of lesser toe of left foot, initial encounter      Plan:  Patient was evaluated and treated and all questions answered.  Ulcer left submet 2 -Debridement as below. -Dressed with Iodosorb, DSD. -He brought his own surgical shoe today which he had from previous treatment.  We applied a peg assist device to this and he will wear this to offload the wound -Continue off-loading with surgical shoe. -If if this does not heal or he has further ulceration on the other metatarsophalangeal joints, could consider minimally invasive metatarsal osteotomies to offload the plantar forefoot through small  dorsal incisions.  We will discuss further in the event that this does not heal  Procedure: Excisional Debridement of Wound Rationale: Removal of non-viable soft tissue from the wound to promote healing.  Anesthesia: none Pre-Debridement Wound Measurements: Covered with hyperkeratosis Post-Debridement Wound Measurements: 0.4 cm x 0.4 cm x 0.2 cm  Type of Debridement: Sharp selective Tissue Removed: Non-viable soft tissue Depth of Debridement: subcutaneous tissue. Technique: Sharp selective debridement to bleeding, viable wound base.  Dressing: Dry, sterile, compression dressing. Disposition: Patient tolerated procedure well.   No follow-ups on file.

## 2020-08-29 ENCOUNTER — Ambulatory Visit: Payer: Medicare Other | Admitting: Podiatry

## 2020-09-19 ENCOUNTER — Ambulatory Visit: Payer: Medicare Other | Admitting: Podiatry

## 2020-09-19 ENCOUNTER — Other Ambulatory Visit: Payer: Self-pay

## 2020-09-19 DIAGNOSIS — L97509 Non-pressure chronic ulcer of other part of unspecified foot with unspecified severity: Secondary | ICD-10-CM | POA: Diagnosis not present

## 2020-09-19 DIAGNOSIS — E11621 Type 2 diabetes mellitus with foot ulcer: Secondary | ICD-10-CM

## 2020-09-19 DIAGNOSIS — L97521 Non-pressure chronic ulcer of other part of left foot limited to breakdown of skin: Secondary | ICD-10-CM

## 2020-09-19 NOTE — Patient Instructions (Signed)
Look for aperture pads on [employer]

## 2020-09-21 NOTE — Progress Notes (Signed)
  Subjective:  Patient ID: Timothy [name], male    DOB: 03/21/1941,  MRN: [id number]  Chief Complaint  Patient presents with  . Wound Check    PT stated that he is doing good he denies any pain and has no concerns at this time     79 y.o. male returns with the above complaint. History confirmed with patient.  Feels well and thinks the ulceration is healed  Objective:  Physical Exam: warm, good capillary refill, normal DP and PT pulses, reduced sensation at tips of toes and plantar foot bilaterally .  Bilaterally there is severe hallux valgus and digital contractures, the worst of which is the left foot with a and overlapping second toe and likely dislocation of the second metatarsophalangeal joint.    Ulceration has now healed      Radiographs: Not currently indicated, we will obtain these if his condition worsens Assessment:   1. Ulcer of left foot, limited to breakdown of skin (HCC)   2. Type 2 diabetes mellitus with foot ulcer, without long-term current use of insulin (HCC)      Plan:  Patient was evaluated and treated and all questions answered.  Ulcer left submet 2 -Ulceration healed no debridement necessary today -Offloading pads dispensed we will continue to wear these -Ulceration occurs or he has other issues could consider metatarsal osteotomies to offload the area  Return if symptoms worsen or fail to improve.

## 2020-12-26 ENCOUNTER — Other Ambulatory Visit: Payer: Self-pay | Admitting: Cardiovascular Disease

## 2021-01-21 ENCOUNTER — Ambulatory Visit: Payer: Medicare Other | Admitting: Cardiovascular Disease

## 2021-01-21 ENCOUNTER — Encounter: Payer: Self-pay | Admitting: Cardiovascular Disease

## 2021-01-21 ENCOUNTER — Other Ambulatory Visit: Payer: Self-pay

## 2021-01-21 VITALS — BP 148/82 | HR 66 | Ht 74.0 in | Wt 260.0 lb

## 2021-01-21 DIAGNOSIS — I2510 Atherosclerotic heart disease of native coronary artery without angina pectoris: Secondary | ICD-10-CM | POA: Diagnosis not present

## 2021-01-21 DIAGNOSIS — I10 Essential (primary) hypertension: Secondary | ICD-10-CM

## 2021-01-21 DIAGNOSIS — E7800 Pure hypercholesterolemia, unspecified: Secondary | ICD-10-CM

## 2021-01-21 NOTE — Progress Notes (Signed)
Chief Complaint  Patient presents with  . Follow-up    CAD    History of Present Illness: 80 yo male with a history of DM, HTN, HLD, coronary artery disease status post non-ST elevation myocardial infarction in 2001 and 2003 and PE in 2014 who is here today for cardiac follow up. Cardiac history includes bare-metal stent placement in the RCA in 2001 and Cypher drug-eluting stent placement in the circumflex artery in 2003. Stress myoview February 2011 with LVEF of 55%. No ischemia.  Patient underwent inguinal hernia repair in July of 2014 complicated by a diverticular bleed requiring right partial colectomy. His Plavix was stopped and then he developed a pulmonary embolus in August 2014 for which he was treated with Coumadin. He has since completed his coumadin therapy and is back on Plavix. Echo January 2019 with LVEF=55%.     He is here today for follow up. The patient denies any chest pain, dyspnea, palpitations, lower extremity edema, orthopnea, PND, dizziness, near syncope or syncope.   He enjoys restoring older VW Beetles.   Primary Care Physician: [name], MD  Past Medical History:  Diagnosis Date  . CAD (coronary artery disease)   . Coronary artery disease    NSTEMI 2001, 2003   . Diabetes mellitus   . History of MI (myocardial infarction)   . Hyperlipidemia   . Hypertension   . Inguinal hernia   . Syncope, carotid sinus     Past Surgical History:  Procedure Laterality Date  . APPENDECTOMY    . CARDIAC CATHETERIZATION  00,03   stents both times  . COLON SURGERY    . COLONOSCOPY    . COLONOSCOPY N/A 07/06/2013   Procedure: COLONOSCOPY;  Surgeon: [name], MD;  Location[facility] ENDOSCOPY;  Service: Endoscopy;  Laterality: N/A;  Tattoo,clips,bicap available  . ESOPHAGOGASTRODUODENOSCOPY N/A 07/06/2013   Procedure: ESOPHAGOGASTRODUODENOSCOPY (EGD);  Surgeon: [name], MD;  Location: [facility] ENDOSCOPY;  Service: Endoscopy;  Laterality: N/A;  at  bedside  . EYE SURGERY  2013   Cataracts-both  . INGUINAL HERNIA REPAIR Right 06/29/2013   Procedure: HERNIA REPAIR INGUINAL ADULT;  Surgeon: [name], MD;  Location: [facility];  Service: General;  Laterality: Right;  . INSERTION OF MESH Right 06/29/2013   Procedure: INSERTION OF MESH;  Surgeon: [name], MD;  Location: [facility];  Service: General;  Laterality: Right;  . LAPAROTOMY Right 07/06/2013   Procedure: EXPLORATORY LAPAROTOMY;  Surgeon: [name], MD;  Location: [facility] OR;  Service: General;  Laterality: Right;  . None    . ROTATOR CUFF REPAIR  2013   lt    Current Outpatient Medications  Medication Sig Dispense Refill  . clopidogrel (PLAVIX) 75 MG tablet Take 1 tablet (75 mg total) by mouth daily. Pt needs to keep upcoming appt in may for further refills 90 tablet 1  . insulin NPH-regular Human (NOVOLIN 70/30) (70-30) 100 UNIT/ML injection Inject into the skin.    Marland Kitchen loratadine (CLARITIN) 10 MG tablet Take 10 mg by mouth daily as needed for allergies.    . metFORMIN (GLUCOPHAGE) 1000 MG tablet Take 1,000 mg by mouth 2 (two) times daily with a meal.    . metoprolol tartrate (LOPRESSOR) 25 MG tablet Take 1 tablet (25 mg total) by mouth 2 (two) times daily. Pt needs to keep upcoming appt in may for further refills 180 tablet 1  . mupirocin ointment (BACTROBAN) 2 % Apply 1  application topically daily. 30 g 2  . ONETOUCH ULTRA test strip 1 each daily.    . pravastatin (PRAVACHOL) 40 MG tablet Take 1 tablet (40 mg total) by mouth every evening. Pt needs to keep upcoming appt in may for further refills 90 tablet 1  . ramipril (ALTACE) 10 MG capsule Take 1 capsule (10 mg total) by mouth daily. Pt needs to keep upcoming appt in may for further refills 90 capsule 1  . RELION INSULIN SYRINGE 31G X 15/64" 1 ML MISC USE 1 TWICE DAILY TO INJECT A MAX OF 85 UNITS LABS IN APRIL     No current facility-administered medications for this visit.    No  Known Allergies  Social History   Socioeconomic History  . Marital status: Married    Spouse name: Not on file  . Number of children: Not on file  . Years of education: Not on file  . Highest education level: Not on file  Occupational History  . Not on file  Tobacco Use  . Smoking status: Never Smoker  . Smokeless tobacco: Never Used  Substance and Sexual Activity  . Alcohol use: No  . Drug use: No  . Sexual activity: Not on file  Other Topics Concern  . Not on file  Social History Narrative  . Not on file   Social Determinants of Health   Financial Resource Strain: Not on file  Food Insecurity: Not on file  Transportation Needs: Not on file  Physical Activity: Not on file  Stress: Not on file  Social Connections: Not on file  Intimate Partner Violence: Not on file    Family History  Problem Relation Age of Onset  . Cancer Brother        colon cancer    Review of Systems:  As stated in the HPI and otherwise negative.   BP (!) 148/82   Pulse 66   Ht 6\' 2"  (1.88 m)   Wt 260 lb (117.9 kg)   SpO2 99%   BMI 33.38 kg/m   Physical Examination:  General: Well developed, well nourished, NAD  HEENT: OP clear, mucus membranes moist  SKIN: warm, dry. No rashes. Neuro: No focal deficits  Musculoskeletal: Muscle strength 5/5 all ext  Psychiatric: Mood and affect normal  Neck: No JVD, no carotid bruits, no thyromegaly, no lymphadenopathy.  Lungs:Clear bilaterally, no wheezes, rhonci, crackles Cardiovascular: Regular rate and rhythm. No murmurs, gallops or rubs. Abdomen:Soft. Bowel sounds present. Non-tender.  Extremities: No lower extremity edema. Pulses are 2 + in the bilateral DP/PT.  Echo 01/04/18: - Left ventricle: The cavity size was normal. Systolic function was   normal. The estimated ejection fraction was 55%. Wall motion was   normal; there were no regional wall motion abnormalities. - Aortic valve: Not well visualized cannot r/o bicuspid valve - Atrial  septum: No defect or patent foramen ovale was identified.  EKG:  EKG is ordered today. The ekg ordered today demonstrates Sinus  Recent Labs: No results found for requested labs within last 8760 hours.   Lipid Panel Followed in primary care   Wt Readings from Last 3 Encounters:  01/21/21 260 lb (117.9 kg)  01/15/20 256 lb (116.1 kg)  06/29/19 257 lb (116.6 kg)     Other studies Reviewed: Additional studies/ records that were reviewed today include: . Review of the above records demonstrates:   Assessment and Plan:   1. CAD without angina: No chest pain. He has not been on ASA due to GI  upset in the past. Continue Plavix, beta blocker and statin.    2. HYPERTENSION: BP is well controlled. No changes  3. Hyperlipidemia: Lipids followed in primary care. Continue statin.   4. History of Pulmonary embolism: This was following a GI surgery in 2014. He has been off of coumadin.   Current medicines are reviewed at length with the patient today.  The patient does not have concerns regarding medicines.  The following changes have been made:  no change  Labs/ tests ordered today include:   Orders Placed This Encounter  Procedures  . EKG 12-Lead    Disposition:   F/U with me in 12  months  Signed, Verne Carrow, MD 01/21/2021 3:15 PM    [facility] [address] [place], [place], [place]  [address] Phone: [phone]; Fax: [phone]

## 2021-01-21 NOTE — Patient Instructions (Signed)
Medication Instructions:  Your physician recommends that you continue on your current medications as directed. Please refer to the Current Medication list given to you today.  *If you need a refill on your cardiac medications before your next appointment, please call your pharmacy*   Lab Work: none If you have labs (blood work) drawn today and your tests are completely normal, you will receive your results only by: Marland Kitchen MyChart Message (if you have MyChart) OR . A paper copy in the mail If you have any lab test that is abnormal or we need to change your treatment, we will call you to review the results.   Testing/Procedures: none   Follow-Up: At [facility], you and your health needs are our priority.  As part of our continuing mission to provide you with exceptional heart care, we have created designated Provider Care Teams.  These Care Teams include your primary Cardiologist (physician) and Advanced Practice Providers (APPs -  Physician Assistants and Nurse Practitioners) who all work together to provide you with the care you need, when you need it.  We recommend signing up for the patient portal called "MyChart".  Sign up information is provided on this After Visit Summary.  MyChart is used to connect with patients for Virtual Visits (Telemedicine).  Patients are able to view lab/test results, encounter notes, upcoming appointments, etc.  Non-urgent messages can be sent to your provider as well.   To learn more about what you can do with MyChart, go to [email or web address].    Your next appointment:   1 year(s)  The format for your next appointment:   In Person  Provider:   You may see Verne Carrow, MD or one of the following Advanced Practice Providers on your designated Care Team:    Ronie Spies, PA-C  Jacolyn Reedy, PA-C

## 2021-04-16 ENCOUNTER — Ambulatory Visit: Payer: Medicare Other | Admitting: Cardiovascular Disease

## 2021-08-05 ENCOUNTER — Other Ambulatory Visit: Payer: Self-pay | Admitting: Cardiovascular Disease

## 2021-09-25 ENCOUNTER — Telehealth: Payer: Self-pay | Admitting: Podiatry

## 2021-09-25 NOTE — Telephone Encounter (Signed)
Pts wife left message stating pt was seen here about a year ago and got a insert thing that was punched out where pt has a callus that was fitted into a boot. She is asking if she could purchase one from [place] as she has not been able to find them anywhere else.   It looks like it may have been a peg assist. Pt has not been seen in a year. Did you want to sell them one?

## 2021-09-25 NOTE — Telephone Encounter (Signed)
Called pt back after talking with Dr Lilian Kapur and offered pt an appt so we would be able to bill insurance for the peg assist and pt really did not want the to hassle [place] by coming  in he just wanted a replacement.  Also discussed with Dr Lilian Kapur if pt did not want an appt he could go online and purchase one and I gave him the name and he said he really appreciates the call back.

## 2022-01-15 NOTE — Progress Notes (Signed)
Chief Complaint  Patient presents with   Follow-up    CAD   History of Present Illness: 81 yo male with a history of DM, HTN, HLD, coronary artery disease status post non-ST elevation myocardial infarction in 2001 and 2003 and PE in 2014 who is here today for cardiac follow up. Cardiac history includes bare-metal stent placement in the RCA in 2001 and Cypher drug-eluting stent placement in the circumflex artery in 2003. Stress myoview February 2011 with LVEF of 55%. No ischemia.  Patient underwent inguinal hernia repair in July of 2014 complicated by a diverticular bleed requiring right partial colectomy. His Plavix was stopped and then he developed a pulmonary embolus in August 2014 for which he was treated with Coumadin. He has since completed his coumadin therapy and is back on Plavix. Echo January 2019 with LVEF=55%.     He is here today for follow up. The patient denies any chest pain, dyspnea, palpitations, lower extremity edema, orthopnea, PND, dizziness, near syncope or syncope.   He enjoys restoring older VW Beetles.   Primary Care Physician: [name], MD  Past Medical History:  Diagnosis Date   CAD (coronary artery disease)    Coronary artery disease    NSTEMI 2001, 2003    Diabetes mellitus    History of MI (myocardial infarction)    Hyperlipidemia    Hypertension    Inguinal hernia    Syncope, carotid sinus     Past Surgical History:  Procedure Laterality Date   APPENDECTOMY     CARDIAC CATHETERIZATION  00,03   stents both times   COLON SURGERY     COLONOSCOPY     COLONOSCOPY N/A 07/06/2013   Procedure: COLONOSCOPY;  Surgeon: [name], MD;  Location: [facility] ENDOSCOPY;  Service: Endoscopy;  Laterality: N/A;  Tattoo,clips,bicap available   ESOPHAGOGASTRODUODENOSCOPY N/A 07/06/2013   Procedure: ESOPHAGOGASTRODUODENOSCOPY (EGD);  Surgeon: [name], MD;  Location: [facility] ENDOSCOPY;  Service: Endoscopy;  Laterality: N/A;  at bedside   EYE  SURGERY  2013   Cataracts-both   INGUINAL HERNIA REPAIR Right 06/29/2013   Procedure: HERNIA REPAIR INGUINAL ADULT;  Surgeon: [name], MD;  Location: [facility];  Service: General;  Laterality: Right;   INSERTION OF MESH Right 06/29/2013   Procedure: INSERTION OF MESH;  Surgeon: [name], MD;  Location: [facility];  Service: General;  Laterality: Right;   LAPAROTOMY Right 07/06/2013   Procedure: EXPLORATORY LAPAROTOMY;  Surgeon: [name], MD;  Location: [facility] OR;  Service: General;  Laterality: Right;   None     ROTATOR CUFF REPAIR  2013   lt    Current Outpatient Medications  Medication Sig Dispense Refill   clopidogrel (PLAVIX) 75 MG tablet TAKE 1 TABLET BY MOUTH  DAILY 90 tablet 3   insulin NPH-regular Human (NOVOLIN 70/30) (70-30) 100 UNIT/ML injection Inject into the skin.     loratadine (CLARITIN) 10 MG tablet Take 10 mg by mouth daily as needed for allergies.     metFORMIN (GLUCOPHAGE) 1000 MG tablet Take 1,000 mg by mouth 2 (two) times daily with a meal.     metoprolol tartrate (LOPRESSOR) 25 MG tablet TAKE 1 TABLET BY MOUTH  TWICE DAILY 180 tablet 3   ONETOUCH ULTRA test strip 1 each daily.     pravastatin (PRAVACHOL) 40 MG tablet TAKE 1 TABLET BY MOUTH IN  THE EVENING 90 tablet 3   ramipril (ALTACE) 10 MG capsule TAKE  1 CAPSULE BY MOUTH  DAILY 90 capsule 3   RELION INSULIN SYRINGE 31G X 15/64" 1 ML MISC USE 1 TWICE DAILY TO INJECT A MAX OF 85 UNITS LABS IN APRIL     No current facility-administered medications for this visit.    No Known Allergies  Social History   Socioeconomic History   Marital status: Married    Spouse name: Not on file   Number of children: Not on file   Years of education: Not on file   Highest education level: Not on file  Occupational History   Not on file  Tobacco Use   Smoking status: Never   Smokeless tobacco: Never  Substance and Sexual Activity   Alcohol use: No   Drug use: No   Sexual  activity: Not on file  Other Topics Concern   Not on file  Social History Narrative   Not on file   Social Determinants of Health   Financial Resource Strain: Not on file  Food Insecurity: Not on file  Transportation Needs: Not on file  Physical Activity: Not on file  Stress: Not on file  Social Connections: Not on file  Intimate Partner Violence: Not on file    Family History  Problem Relation Age of Onset   Cancer Brother        colon cancer    Review of Systems:  As stated in the HPI and otherwise negative.   BP 126/60    Pulse 62    Ht 6\' 2"  (1.88 m)    Wt 251 lb 9.6 oz (114.1 kg)    SpO2 98%    BMI 32.30 kg/m   Physical Examination: General: Well developed, well nourished, NAD  HEENT: OP clear, mucus membranes moist  SKIN: warm, dry. No rashes. Neuro: No focal deficits  Musculoskeletal: Muscle strength 5/5 all ext  Psychiatric: Mood and affect normal  Neck: No JVD, no carotid bruits, no thyromegaly, no lymphadenopathy.  Lungs:Clear bilaterally, no wheezes, rhonci, crackles Cardiovascular: Regular rate and rhythm. No murmurs, gallops or rubs. Abdomen:Soft. Bowel sounds present. Non-tender.  Extremities: No lower extremity edema. Pulses are 2 + in the bilateral DP/PT.  Echo 01/04/18: - Left ventricle: The cavity size was normal. Systolic function was   normal. The estimated ejection fraction was 55%. Wall motion was   normal; there were no regional wall motion abnormalities. - Aortic valve: Not well visualized cannot r/o bicuspid valve - Atrial septum: No defect or patent foramen ovale was identified.  EKG:  EKG is ordered today. The ekg ordered today demonstrates Sinus  Recent Labs: No results found for requested labs within last 8760 hours.   Lipid Panel Followed in primary care   Wt Readings from Last 3 Encounters:  01/16/22 251 lb 9.6 oz (114.1 kg)  01/21/21 260 lb (117.9 kg)  01/15/20 256 lb (116.1 kg)     Other studies Reviewed: Additional  studies/ records that were reviewed today include: . Review of the above records demonstrates:   Assessment and Plan:   1. CAD without angina: No chest pain. He has not been on ASA due to GI upset in the past. Will continue Plavix, beta blocker and statin.     2. HYPERTENSION: BP is controlled. No changes today  3. Hyperlipidemia: Lipids followed in primary care. Well controlled per pt. Will continue statin.    4. History of Pulmonary embolism: This was following a GI surgery in 2014. He has been off of coumadin.   Current medicines  are reviewed at length with the patient today.  The patient does not have concerns regarding medicines.  The following changes have been made:  no change  Labs/ tests ordered today include:   Orders Placed This Encounter  Procedures   EKG 12-Lead    Disposition:   F/U with me in 12  months  Signed, Verne Carrow, MD 01/16/2022 11:41 AM    [facility] [address] [place], [place], [place]  [address] Phone: [phone]; Fax: [phone]

## 2022-01-16 ENCOUNTER — Ambulatory Visit: Payer: Medicare Other | Admitting: Cardiovascular Disease

## 2022-01-16 ENCOUNTER — Other Ambulatory Visit: Payer: Self-pay

## 2022-01-16 ENCOUNTER — Encounter: Payer: Self-pay | Admitting: Cardiovascular Disease

## 2022-01-16 VITALS — BP 126/60 | HR 62 | Ht 74.0 in | Wt 251.6 lb

## 2022-01-16 DIAGNOSIS — E7800 Pure hypercholesterolemia, unspecified: Secondary | ICD-10-CM

## 2022-01-16 DIAGNOSIS — I10 Essential (primary) hypertension: Secondary | ICD-10-CM

## 2022-01-16 DIAGNOSIS — I2510 Atherosclerotic heart disease of native coronary artery without angina pectoris: Secondary | ICD-10-CM

## 2022-01-16 NOTE — Patient Instructions (Signed)

## 2022-03-17 ENCOUNTER — Encounter: Payer: Self-pay | Admitting: Podiatry

## 2022-03-17 ENCOUNTER — Ambulatory Visit: Payer: Medicare Other | Admitting: Podiatry

## 2022-03-17 DIAGNOSIS — M79675 Pain in left toe(s): Secondary | ICD-10-CM | POA: Diagnosis not present

## 2022-03-17 DIAGNOSIS — E119 Type 2 diabetes mellitus without complications: Secondary | ICD-10-CM

## 2022-03-17 DIAGNOSIS — M79674 Pain in right toe(s): Secondary | ICD-10-CM | POA: Diagnosis not present

## 2022-03-17 DIAGNOSIS — M2011 Hallux valgus (acquired), right foot: Secondary | ICD-10-CM | POA: Diagnosis not present

## 2022-03-17 DIAGNOSIS — B351 Tinea unguium: Secondary | ICD-10-CM

## 2022-03-17 DIAGNOSIS — E1151 Type 2 diabetes mellitus with diabetic peripheral angiopathy without gangrene: Secondary | ICD-10-CM | POA: Diagnosis not present

## 2022-03-17 DIAGNOSIS — Z8631 Personal history of diabetic foot ulcer: Secondary | ICD-10-CM | POA: Diagnosis not present

## 2022-03-17 DIAGNOSIS — L84 Corns and callosities: Secondary | ICD-10-CM

## 2022-03-17 DIAGNOSIS — M2012 Hallux valgus (acquired), left foot: Secondary | ICD-10-CM

## 2022-03-17 NOTE — Patient Instructions (Addendum)
-Recommend Skechers Loafers with stretchable uppers and memory foam insoles. They can be purchased at [employer], [employer], or [employer]. Also on DealerOdds.hu.   ? ?Continue felt aperture padding on left foot callus daily.  ? ?You are eligible for one pair diabetic shoes and 3 pair of custom insoles every year.  ? ?Corns and Calluses ?Corns are small areas of thickened skin that form on the top, sides, or tip of a toe. Corns have a cone-shaped core with a point that can press on a nerve below. This causes pain. ?Calluses are areas of thickened skin that can form anywhere on the body, including the hands, fingers, palms, soles of the feet, and heels. Calluses are usually larger than corns. ?What are the causes? ?Corns and calluses are caused by rubbing (friction) or pressure, such as from shoes that are too tight or do not fit properly. ?What increases the risk? ?Corns are more likely to develop in people who have misshapen toes (toe deformities), such as hammer toes. ?Calluses can form with friction to any area of the skin. They are more likely to develop in people who: ?Work with their hands. ?Wear shoes that fit poorly, are too tight, or are high-heeled. ?Have toe deformities. ?What are the signs or symptoms? ?Symptoms of a corn or callus include: ?A hard growth on the skin. ?Pain or tenderness under the skin. ?Redness and swelling. ?Increased discomfort while wearing tight-fitting shoes, if your feet are affected. ?If a corn or callus becomes infected, symptoms may include: ?Redness and swelling that gets worse. ?Pain. ?Fluid, blood, or pus draining from the corn or callus. ?How is this diagnosed? ?Corns and calluses may be diagnosed based on your symptoms, your medical history, and a physical exam. ?How is this treated? ?Treatment for corns and calluses may include: ?Removing the cause of the friction or pressure. This may involve: ?Changing your shoes. ?Wearing shoe inserts (orthotics) or other  protective layers in your shoes, such as a corn pad. ?Wearing gloves. ?Applying medicine to the skin (topical medicine) to help soften skin in the hardened, thickened areas. ?Removing layers of dead skin with a file to reduce the size of the corn or callus. ?Removing the corn or callus with a scalpel or laser. ?Taking antibiotic medicines, if your corn or callus is infected. ?Having surgery, if a toe deformity is the cause. ?Follow these instructions at home: ? ?Take over-the-counter and prescription medicines only as told by your health care provider. ?If you were prescribed an antibiotic medicine, take it as told by your health care provider. Do not stop taking it even if your condition improves. ?Wear shoes that fit well. Avoid wearing high-heeled shoes and shoes that are too tight or too loose. ?Wear any padding, protective layers, gloves, or orthotics as told by your health care provider. ?Soak your hands or feet. Then use a file or pumice stone to soften your corn or callus. Do this as told by your health care provider. ?Check your corn or callus every day for signs of infection. ?Contact a health care provider if: ?Your symptoms do not improve with treatment. ?You have redness or swelling that gets worse. ?Your corn or callus becomes painful. ?You have fluid, blood, or pus coming from your corn or callus. ?You have new symptoms. ?Get help right away if: ?You develop severe pain with redness. ?Summary ?Corns are small areas of thickened skin that form on the top, sides, or tip of a toe. These can be painful. ?Calluses  are areas of thickened skin that can form anywhere on the body, including the hands, fingers, palms, and soles of the feet. Calluses are usually larger than corns. ?Corns and calluses are caused by rubbing (friction) or pressure, such as from shoes that are too tight or do not fit properly. ?Treatment may include wearing padding, protective layers, gloves, or orthotics as told by your health care  provider. ?This information is not intended to replace advice given to you by your health care provider. Make sure you discuss any questions you have with your health care provider. ?Document Revised: 03/21/2020 Document Reviewed: 03/21/2020 ?Elsevier Patient Education ? 2022 Elsevier Inc. ? ? ?Diabetes Mellitus and Foot Care ?Foot care is an important part of your health, especially when you have diabetes. Diabetes may cause you to have problems because of poor blood flow (circulation) to your feet and legs, which can cause your skin to: ?Become thinner and drier. ?Break more easily. ?Heal more slowly. ?Peel and crack. ?You may also have nerve damage (neuropathy) in your legs and feet, causing decreased feeling in them. This means that you may not notice minor injuries to your feet that could lead to more serious problems. Noticing and addressing any potential problems early is the best way to prevent future foot problems. ?How to care for your feet ?Foot hygiene ? ?Wash your feet daily with warm water and mild soap. Do not use hot water. Then, pat your feet and the areas between your toes until they are completely dry. Do not soak your feet as this can dry your skin. ?Trim your toenails straight across. Do not dig under them or around the cuticle. File the edges of your nails with an emery board or nail file. ?Apply a moisturizing lotion or petroleum jelly to the skin on your feet and to dry, brittle toenails. Use lotion that does not contain alcohol and is unscented. Do not apply lotion between your toes. ?Shoes and socks ?Wear clean socks or stockings every day. Make sure they are not too tight. Do not wear knee-high stockings since they may decrease blood flow to your legs. ?Wear shoes that fit properly and have enough cushioning. Always look in your shoes before you put them on to be sure there are no objects inside. ?To break in new shoes, wear them for just a few hours a day. This prevents injuries on your  feet. ?Wounds, scrapes, corns, and calluses ? ?Check your feet daily for blisters, cuts, bruises, sores, and redness. If you cannot see the bottom of your feet, use a mirror or ask someone for help. ?Do not cut corns or calluses or try to remove them with medicine. ?If you find a minor scrape, cut, or break in the skin on your feet, keep it and the skin around it clean and dry. You may clean these areas with mild soap and water. Do not clean the area with peroxide, alcohol, or iodine. ?If you have a wound, scrape, corn, or callus on your foot, look at it several times a day to make sure it is healing and not infected. Check for: ?Redness, swelling, or pain. ?Fluid or blood. ?Warmth. ?Pus or a bad smell. ?General tips ?Do not cross your legs. This may decrease blood flow to your feet. ?Do not use heating pads or hot water bottles on your feet. They may burn your skin. If you have lost feeling in your feet or legs, you may not know this is happening until it is too  late. ?Protect your feet from hot and cold by wearing shoes, such as at the beach or on hot pavement. ?Schedule a complete foot exam at least once a year (annually) or more often if you have foot problems. Report any cuts, sores, or bruises to your health care provider immediately. ?Where to find more information ?American Diabetes Association: www.diabetes.org ?Association of Diabetes Care & Education Specialists: www.diabeteseducator.org ?Contact a health care provider if: ?You have a medical condition that increases your risk of infection and you have any cuts, sores, or bruises on your feet. ?You have an injury that is not healing. ?You have redness on your legs or feet. ?You feel burning or tingling in your legs or feet. ?You have pain or cramps in your legs and feet. ?Your legs or feet are numb. ?Your feet always feel cold. ?You have pain around any toenails. ?Get help right away if: ?You have a wound, scrape, corn, or callus on your foot and: ?You  have pain, swelling, or redness that gets worse. ?You have fluid or blood coming from the wound, scrape, corn, or callus. ?Your wound, scrape, corn, or callus feels warm to the touch. ?You have pus or a

## 2022-03-22 NOTE — Progress Notes (Signed)
ANNUAL DIABETIC FOOT EXAM ? ?Subjective: ?Timothy [name] presents today for annual diabetic foot examination. ? ?Patient relates 25 year h/o diabetes. ? ?Patient has h/o foot ulcer of plantar aspect of left lower extremity in 2021 which healed via help of local wound care and offloading of lesion.. ? ?He states he stepped on something about 2-3 days ago and scraped his right foot. He denies any redness, drainage or swelling. Denies any pain in RLE.States it has nearly healed. ? ?Patient denies any numbness, tingling, burning, or pins/needle sensation in feet. ? ?Patient's blood sugar was 99 mg/dl today.  ? ?Patient states his insurance company sends a nurse out to his home periodically. ? ?Risk factors: diabetes, history of foot/leg ulcer, h/o MI, HTN, CAD, hyperlipidemia. ? ?He states he and his wife have been trimming his toenails and calluses. ? ?[name], MD is patient's PCP. Last visit was December, 2022. ? ?Past Medical History:  ?Diagnosis Date  ? CAD (coronary artery disease)   ? Coronary artery disease   ? NSTEMI 2001, 2003   ? Diabetes mellitus   ? History of MI (myocardial infarction)   ? Hyperlipidemia   ? Hypertension   ? Inguinal hernia   ? Syncope, carotid sinus   ? ?Patient Active Problem List  ? Diagnosis Date Noted  ? Inguinal hernia   ? Syncope, carotid sinus   ? Hypertension   ? Hyperlipidemia   ? History of MI (myocardial infarction)   ? Coronary artery disease   ? CAD (coronary artery disease)   ? Medication management 10/04/2013  ? Bilateral pulmonary embolism (HCC) 07/20/2013  ? Normocytic anemia 07/20/2013  ? Hypokalemia 07/20/2013  ? Respiratory failure, post-operative (HCC) 07/07/2013  ? Hemorrhagic shock (HCC) 07/05/2013  ? Acute blood loss anemia 07/04/2013  ? Acute lower GI bleeding 07/01/2013  ? Abdominal pain 07/01/2013  ? Inguinal hernia unilateral, non-recurrent, right 06/06/2013  ? Chest pain 09/17/2012  ? Diabetes (HCC) 09/17/2012  ? HYPERLIPIDEMIA-MIXED 01/29/2009   ? 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 HYPERTENSION, BENIGN 01/29/2009  ? CAD, NATIVE VESSEL 01/29/2009  ? ?Past Surgical History:  ?Procedure Laterality Date  ? APPENDECTOMY    ? CARDIAC CATHETERIZATION  00,03  ? stents both times  ? COLON SURGERY    ? COLONOSCOPY    ? COLONOSCOPY N/A 07/06/2013  ? Procedure: COLONOSCOPY;  Surgeon: [name], MD;  Location: [facility] ENDOSCOPY;  Service: Endoscopy;  Laterality: N/A;  Tattoo,clips,bicap available  ? ESOPHAGOGASTRODUODENOSCOPY N/A 07/06/2013  ? Procedure: ESOPHAGOGASTRODUODENOSCOPY (EGD);  Surgeon: [name], MD;  Location: [facility] ENDOSCOPY;  Service: Endoscopy;  Laterality: N/A;  at bedside  ? EYE SURGERY  2013  ? Cataracts-both  ? INGUINAL HERNIA REPAIR Right 06/29/2013  ? Procedure: HERNIA REPAIR INGUINAL ADULT;  Surgeon: [name], MD;  Location: [facility];  Service: General;  Laterality: Right;  ? INSERTION OF MESH Right 06/29/2013  ? Procedure: INSERTION OF MESH;  Surgeon: [name], MD;  Location: [facility];  Service: General;  Laterality: Right;  ? LAPAROTOMY Right 07/06/2013  ? Procedure: EXPLORATORY LAPAROTOMY;  Surgeon: [name], MD;  Location: [facility] OR;  Service: General;  Laterality: Right;  ? None    ? ROTATOR CUFF REPAIR  2013  ? lt  ? ?Current Outpatient Medications on File Prior to Visit  ?Medication Sig Dispense Refill  ? clopidogrel (PLAVIX) 75 MG tablet TAKE 1 TABLET BY MOUTH  DAILY 90 tablet 3  ?  insulin NPH-regular Human (NOVOLIN 70/30) (70-30) 100 UNIT/ML injection Inject into the skin.    ? loratadine (CLARITIN) 10 MG tablet Take 10 mg by mouth daily as needed for allergies.    ? metFORMIN (GLUCOPHAGE) 1000 MG tablet Take 1,000 mg by mouth 2 (two) times daily with a meal.    ? metoprolol tartrate (LOPRESSOR) 25 MG tablet TAKE 1 TABLET BY MOUTH  TWICE DAILY 180 tablet 3  ? ONETOUCH ULTRA test strip 1 each daily.    ? pravastatin (PRAVACHOL) 40 MG tablet TAKE 1 TABLET BY MOUTH IN  THE EVENING 90  tablet 3  ? ramipril (ALTACE) 10 MG capsule TAKE 1 CAPSULE BY MOUTH  DAILY 90 capsule 3  ? RELION INSULIN SYRINGE 31G X 15/64" 1 ML MISC USE 1 TWICE DAILY TO INJECT A MAX OF 85 UNITS LABS IN APRIL    ? ?No current facility-administered medications on file prior to visit.  ?  ?No Known Allergies ?Social History  ? ?Occupational History  ? Not on file  ?Tobacco Use  ? Smoking status: Never  ? Smokeless tobacco: Never  ?Substance and Sexual Activity  ? Alcohol use: No  ? Drug use: No  ? Sexual activity: Not on file  ? ?Family History  ?Problem Relation Age of Onset  ? Cancer Brother   ?     colon cancer  ? ? ?There is no immunization history on file for this patient.  ? ?Review of Systems: Negative except as noted in the HPI.  ? ?Objective: ?There were no vitals filed for this visit. ? ?Timothy [name] is a pleasant 81 y.o. male in NAD. AAO X 3. ? ?Vascular Examination: ?CFT <3 seconds b/l LE. Palpable DP pulse(s) b/l LE. Diminished PT pulse(s) b/l LE. Pedal hair absent. No pain with calf compression b/l. +1 pitting edema bilateral ankles. No cyanosis or clubbing noted b/l LE. ? ?Dermatological Examination: ?No open wounds b/l LE. No interdigital macerations noted b/l LE. Toenails 1-5 b/l elongated, discolored, dystrophic, thickened, crumbly with subungual debris and tenderness to dorsal palpation. Hyperkeratotic lesion(s) submet head 5 left foot.  No erythema, no edema, no drainage, no fluctuance. Preulcerative lesion noted submet head 2 left foot. There is visible subdermal hemorrhage. There is no surrounding erythema, no edema, no drainage, no odor, no fluctuance. Healing abrasion(s) with intact scab noted plantar aspect right forefoot area with no warmth. No erythema, no edema, no drainage, no fluctuance. ? ?Neurological Examination: ?Protective sensation intact 5/5 intact bilaterally with 10g monofilament b/l. Vibratory sensation intact b/l. ? ?Musculoskeletal Examination: ?Muscle strength 5/5 to all lower  extremity muscle groups bilaterally. Severe HAV with bunion b/l. Overlapping hammertoe deformity left 2nd digit. No pain, crepitus or joint limitation noted with ROM bilateral LE. ? ?Footwear Assessment: ?Does the patient wear appropriate shoes? No. ?Does the patient need inserts/orthotics? Yes. ? ?Assessment: ?1. Pain due to onychomycosis of toenails of both feet   ?2. Pre-ulcerative calluses   ?3. Hallux valgus, acquired, bilateral   ?4. History of diabetic ulcer of foot   ?5. Type II diabetes mellitus with peripheral circulatory disorder (HCC)   ?6. Encounter for diabetic foot exam (HCC)   ?  ?ADA Risk Categorization: ?High Risk  ?Patient has one or more of the following: ?Loss of protective sensation ?Absent pedal pulses ?Severe Foot deformity ?History of foot ulcer ? ?Plan: ?-Patient was evaluated and treated. All patient's and/or POA's questions/concerns answered on today's visit. ?-Discussed diabetic therapeutic shoe program for which he qualifies based  on history of prior foot ulcer. He declines non today. I did recommend Skechers shoe with stretchable uppers. ?-Diabetic foot examination performed today. ?-Continue foot and shoe inspections daily. Monitor blood glucose per PCP/Endocrinologist's recommendations. ?-Patient/POA educated on dangers of using sharp instrumentation on toes/feet. Recommended continued professional foot care in presence of diabetes. Patient/POA relates understanding. ?-Mycotic toenails 1-5 bilaterally were debrided in length and girth with sterile nail nippers and dremel without incident. ?-Callus(es) submet head 5 left foot pared utilizing sterile scalpel blade without complication or incident. Total number debrided =1. ?-Preulcerative lesion pared submet head 2 left foot. Total number pared=1. ?-Continue felt aperture padding on left foot submet head 2 lesion daily. ?-Patient/POA to call should there be question/concern in the interim. ?Return in about 9 weeks (around  05/19/2022). ? ?Freddie BreechJennifer L My Madariaga, DPM ?

## 2022-05-18 ENCOUNTER — Ambulatory Visit (INDEPENDENT_AMBULATORY_CARE_PROVIDER_SITE_OTHER): Payer: Medicare Other | Admitting: Podiatry

## 2022-05-18 ENCOUNTER — Encounter: Payer: Self-pay | Admitting: Podiatry

## 2022-05-18 DIAGNOSIS — R234 Changes in skin texture: Secondary | ICD-10-CM | POA: Diagnosis not present

## 2022-05-18 DIAGNOSIS — Q828 Other specified congenital malformations of skin: Secondary | ICD-10-CM | POA: Diagnosis not present

## 2022-05-18 DIAGNOSIS — E1151 Type 2 diabetes mellitus with diabetic peripheral angiopathy without gangrene: Secondary | ICD-10-CM

## 2022-05-18 DIAGNOSIS — M79675 Pain in left toe(s): Secondary | ICD-10-CM | POA: Diagnosis not present

## 2022-05-18 DIAGNOSIS — L84 Corns and callosities: Secondary | ICD-10-CM

## 2022-05-18 DIAGNOSIS — M79674 Pain in right toe(s): Secondary | ICD-10-CM | POA: Diagnosis not present

## 2022-05-18 DIAGNOSIS — B351 Tinea unguium: Secondary | ICD-10-CM

## 2022-05-24 NOTE — Progress Notes (Signed)
  Subjective:  Patient ID: Timothy [name], male    DOB: [birth year],  MRN: [id number]  Timothy [name] presents to clinic today for at risk foot care. Pt has h/o NIDDM with PAD and preulcerative lesion(s) left lower extremity and painful mycotic toenails that limit ambulation. Painful toenails interfere with ambulation. Aggravating factors include wearing enclosed shoe gear. Pain is relieved with periodic professional debridement. Painful porokeratotic lesions are aggravated when weightbearing with and without shoegear. Pain is relieved with periodic professional debridement.  Patient states blood glucose was 92 mg/dl today.    Last known HgA1c was 6.2%.  New problem(s): None.   PCP is [name], MD , and last visit was December, 2022.  No Known Allergies  Review of Systems: Negative except as noted in the HPI.  Objective: No changes noted in today's physical examination.  Timothy [name] is a pleasant 81 y.o. male in NAD. AAO X 3.  Vascular Examination: CFT <3 seconds b/l LE. Palpable DP pulse(s) b/l LE. Diminished PT pulse(s) b/l LE. Pedal hair absent. No pain with calf compression b/l. +1 pitting edema bilateral ankles. No cyanosis or clubbing noted b/l LE.  Dermatological Examination: He does have a skin crack of the left 1st webspace from chronic toe separator use.Toenails 1-5 b/l elongated, discolored, dystrophic, thickened, crumbly with subungual debris and tenderness to dorsal palpation. Hyperkeratotic lesion(s) submet head 5 left foot.  No erythema, no edema, no drainage, no fluctuance. Preulcerative lesion noted submet head 2 left foot. He does wear felt callus pads daily.There is visible subdermal hemorrhage. There is no surrounding erythema, no edema, no drainage, no odor, no fluctuance. Hyperkeratotic lesion(s) submet head 2 left foot. No erythema, no edema, no drainage, no fluctuance noted. Porokeratotic lesion(s) submet head 5 left foot. No erythema, no edema, no  drainage, no fluctuance noted.  Neurological Examination: Protective sensation intact 5/5 intact bilaterally with 10g monofilament b/l. Vibratory sensation intact b/l.  Musculoskeletal Examination: Muscle strength 5/5 to all lower extremity muscle groups bilaterally. Severe HAV with bunion b/l. Overlapping hammertoe deformity left 2nd digit. No pain, crepitus or joint limitation noted with ROM bilateral LE.  Assessment/Plan: 1. Pain due to onychomycosis of toenails of both feet   2. Cracked skin on feet   3. Pre-ulcerative calluses   4. Porokeratosis   5. Type II diabetes mellitus with peripheral circulatory disorder [facility])     -Patient was evaluated and treated. All patient's and/or POA's questions/concerns answered on today's visit. -For skin crack left 1st webspace, area cleansed with alcohol. Silvadene Cream and band-aid applied. He is to apply Neosporin Cream and dressing once daily until healed. Call office if he has any problems.. -Toenails 1-5 b/l were debrided in length and girth with sterile nail nippers and dremel without iatrogenic bleeding.  -Preulcerative lesion pared submet head 2 left foot. Total number pared=1. -Porokeratotic lesion(s) submet head 5 left foot pared and enucleated with sterile scalpel blade without incident. Total number of lesions debrided=1. -Patient/POA to call should there be question/concern in the interim.   Return in about 9 weeks (around 07/20/2022).  Freddie Breech, DPM

## 2022-06-17 ENCOUNTER — Other Ambulatory Visit: Payer: Self-pay | Admitting: Cardiovascular Disease

## 2022-06-23 ENCOUNTER — Telehealth: Payer: Self-pay | Admitting: Cardiovascular Disease

## 2022-06-23 NOTE — Telephone Encounter (Signed)
Dr. Jeannetta Nap wants the patient to decrease to half of ramipril and half of metoprolol x 2 weeks to see if the high potassium can be corrected by this.  Potassium level last Wed was 5.4.  He is waiting for permission from Dr. Clifton James before doing this. 130/60s range has been BP.  Pt aware I will forward to MD and PharmD for any input and then I can call him back. I asked him about fresh fruits, tomatoes, blueberries, strawberries.  He does not feel he is eating any extra than normal.  These recent labs as part of his annual physical but he does not know potassium to be high before now.  Has been taking ramipril for years and year.  His other question is is there a benefit for him to be on pravastatin - as he has read that for people >73 years old it does not show benefit.  I adv that may not apply to him with his hx of CAD, stents.   He will await input from MD and PharmD.

## 2022-06-23 NOTE — Telephone Encounter (Signed)
Pt c/o medication issue:  1. Name of Medication: ramipril (ALTACE) 10 MG capsule  metoprolol tartrate (LOPRESSOR) 25 MG tablet  2. How are you currently taking this medication (dosage and times per day)?   3. Are you having a reaction (difficulty breathing--STAT)?   4. What is your medication issue? Was told by his PCP that he needed to cut back on these medications to see if his potassium corrected. Requesting call back to discuss this change.

## 2022-06-25 NOTE — Telephone Encounter (Signed)
That's fine to decrease his ramipril dose due to hyperkalemia. Not sure why metoprolol dose would need to be decreased unless pt was bradycardic since his BP looks fine.   He has heart disease so there is cardiac benefit in reducing his risk of future MI/stroke by continuing statin therapy. He likely was reading about primary prevention patients over 80 who do not have heart disease.

## 2022-06-26 NOTE — Telephone Encounter (Signed)
Reviewed information from PharmD with the patient.  He voices understanding and agreement.  He will reach out to his PCP re: decreasing ramipril.

## 2022-07-06 ENCOUNTER — Telehealth: Payer: Self-pay | Admitting: Cardiovascular Disease

## 2022-07-06 ENCOUNTER — Other Ambulatory Visit: Payer: Medicare Other

## 2022-07-06 DIAGNOSIS — E875 Hyperkalemia: Secondary | ICD-10-CM

## 2022-07-06 NOTE — Telephone Encounter (Signed)
Will forward to Dr. Clifton James and his nurse. Lab results are under lab section in patient's chart to review.

## 2022-07-06 NOTE — Telephone Encounter (Signed)
Most recent labs from PCP are from 7/25 where potassium is 5.7.  This is higher than last reading before he cut the Altace in half.    Per Dr. Aundra Dubin, pt will come in to our office to recheck BMET this afternoon and he will stop the Altace altogether if it is still elevated, and if that does not correct the potassium, we will refer him back to PCP to further work up.

## 2022-07-06 NOTE — Telephone Encounter (Signed)
   Pt said, he had a blood work done at his pcp office and was told his potasium is high and to call Dr. Clifton James. His pcp said will send lab result

## 2022-07-07 ENCOUNTER — Telehealth: Payer: Self-pay

## 2022-07-07 DIAGNOSIS — E875 Hyperkalemia: Secondary | ICD-10-CM

## 2022-07-07 DIAGNOSIS — I10 Essential (primary) hypertension: Secondary | ICD-10-CM

## 2022-07-07 DIAGNOSIS — Z79899 Other long term (current) drug therapy: Secondary | ICD-10-CM

## 2022-07-07 LAB — BASIC METABOLIC PANEL
BUN/Creatinine Ratio: 16 (ref 10–24)
BUN: 30 mg/dL — ABNORMAL HIGH (ref 8–27)
CO2: 23 mmol/L (ref 20–29)
Calcium: 8.6 mg/dL (ref 8.6–10.2)
Chloride: 104 mmol/L (ref 96–106)
Creatinine, Ser: 1.86 mg/dL — ABNORMAL HIGH (ref 0.76–1.27)
Glucose: 160 mg/dL — ABNORMAL HIGH (ref 70–99)
Potassium: 5.2 mmol/L (ref 3.5–5.2)
Sodium: 139 mmol/L (ref 134–144)
eGFR: 35 mL/min/{1.73_m2} — ABNORMAL LOW (ref >59)

## 2022-07-07 NOTE — Telephone Encounter (Signed)
Spoke with pt and advised of lab results per Dr Clifton James.  Potassium now 5.2. he should stop the Altace completely. He will need a repeat BMET in one week. Follow BP at home. If needed, he can be started on something else for his HTN.  Timothy [name]  Pt verbalizes understanding and agrees with current plan.  Lab appt scheduled for 07/17/2022.  BMET order placed.

## 2022-07-07 NOTE — Telephone Encounter (Signed)
-----   Message from Kathleene Hazel, MD sent at 07/07/2022  9:52 AM EDT ----- Potassium now 5.2. he should stop the Altace completely. He will need a repeat BMET in one week. Follow BP at home. If needed, he can be started on something else for his HTN. Thayer Ohm

## 2022-07-08 NOTE — Telephone Encounter (Signed)
See lab results and follow up encounter dated 07/07/22.

## 2022-07-17 ENCOUNTER — Other Ambulatory Visit: Payer: Medicare Other

## 2022-07-17 DIAGNOSIS — I10 Essential (primary) hypertension: Secondary | ICD-10-CM

## 2022-07-17 DIAGNOSIS — E875 Hyperkalemia: Secondary | ICD-10-CM

## 2022-07-17 DIAGNOSIS — Z79899 Other long term (current) drug therapy: Secondary | ICD-10-CM

## 2022-07-20 ENCOUNTER — Telehealth: Payer: Self-pay | Admitting: Cardiovascular Disease

## 2022-07-20 LAB — BASIC METABOLIC PANEL
BUN/Creatinine Ratio: 12 (ref 10–24)
BUN: 20 mg/dL (ref 8–27)
CO2: 24 mmol/L (ref 20–29)
Calcium: 9.2 mg/dL (ref 8.6–10.2)
Chloride: 104 mmol/L (ref 96–106)
Creatinine, Ser: 1.61 mg/dL — ABNORMAL HIGH (ref 0.76–1.27)
Glucose: 35 mg/dL — CL (ref 70–99)
Potassium: 4.4 mmol/L (ref 3.5–5.2)
Sodium: 142 mmol/L (ref 134–144)
eGFR: 42 mL/min/{1.73_m2} — ABNORMAL LOW (ref >59)

## 2022-07-20 NOTE — Telephone Encounter (Signed)
Clydie Braun from [facility] is calling to report a critical lab. Transferring call to triage.

## 2022-07-20 NOTE — Telephone Encounter (Signed)
Reviewed critical lab results on patient for his glucose of 35. Called patient to make sure he was okay. Patient state he was not feeling 100% that day, but he was fine. Patient stated he ate him a "fun size" candy bar and felt better. Patient state this happens often and he just eats something and he is good. Patient stated that on that morning he felt like he took to much insulin, but he worked it out.   Side note patient stated his potassium was better. Patient stated he did stop Ramipril, but he also stopped drinking orange juice and having grapefruit. He feels like the diet change has made the most impact. Will forward to Dr. Clifton James so he is aware. Will send a copy to patient's PCP as well.

## 2022-07-27 ENCOUNTER — Ambulatory Visit: Payer: Medicare Other | Admitting: Podiatry

## 2022-07-27 ENCOUNTER — Encounter: Payer: Self-pay | Admitting: Podiatry

## 2022-07-27 DIAGNOSIS — L84 Corns and callosities: Secondary | ICD-10-CM

## 2022-07-27 DIAGNOSIS — E1151 Type 2 diabetes mellitus with diabetic peripheral angiopathy without gangrene: Secondary | ICD-10-CM

## 2022-07-27 DIAGNOSIS — M79674 Pain in right toe(s): Secondary | ICD-10-CM

## 2022-07-27 DIAGNOSIS — B351 Tinea unguium: Secondary | ICD-10-CM

## 2022-07-27 DIAGNOSIS — M79675 Pain in left toe(s): Secondary | ICD-10-CM

## 2022-08-02 NOTE — Progress Notes (Signed)
  Subjective:  Patient ID: Timothy [name], male    DOB: Oct 08, 1941,  MRN: [id number]  Timothy [name] presents to clinic today for at risk foot care. Pt has h/o NIDDM with PAD and preulcerative lesion(s) left lower extremity and painful mycotic toenails that limit ambulation. Painful toenails interfere with ambulation. Aggravating factors include wearing enclosed shoe gear. Pain is relieved with periodic professional debridement. Painful porokeratotic lesions are aggravated when weightbearing with and without shoegear. Pain is relieved with periodic professional debridement.  Patient states blood glucose was 97 mg/dl today. HgA1c was 6.2%.    New problem(s): None.   PCP is [name], MD , and last visit was June, 2023.  No Known Allergies  Review of Systems: Negative except as noted in the HPI.  Objective: No changes noted in today's physical examination. NEZAR [name] is a pleasant 81 y.o. y.o. male in NAD. AAO x 3.  Vascular Examination: CFT <3 seconds b/l. DP pulses palpable b/l. PT pulses nonpalpable b/l. Digital hair absent. Skin temperature gradient warm to warm b/l. No pain with calf compression. No ischemia or gangrene. No cyanosis or clubbing noted b/l. +1 pitting edema bilateral ankles.   Neurological Examination: Sensation grossly intact b/l with 10 gram monofilament. Vibratory sensation intact b/l.   Dermatological Examination: Pedal skin warm and supple b/l. Toenails 1-5 b/l thick, discolored, elongated with subungual debris and pain on dorsal palpation.  Hyperkeratotic lesion(s) submet head 5 left foot.  No erythema, no edema, no drainage, no fluctuance. Preulcerative lesion noted submet head 2 left foot. There is visible subdermal hemorrhage. There is no surrounding erythema, no edema, no drainage, no odor, no fluctuance.  Musculoskeletal Examination: Muscle strength 5/5 to b/l LE. Severe HAV with bunion deformity noted b/l LE. Crossover hammertoe deformity  noted L 2nd toe.  Radiographs: None  Assessment/Plan: 1. Pain due to onychomycosis of toenails of both feet   2. Pre-ulcerative calluses   3. Callus   4. Type II diabetes mellitus with peripheral circulatory disorder (HCC)   -Examined patient. -Mycotic toenails 1-5 bilaterally were debrided in length and girth with sterile nail nippers and dremel without incident. -Callus(es) submet head 5 left foot pared utilizing sterile scalpel blade without complication or incident. Total number debrided =1. -Preulcerative lesion pared submet head 2 left foot. Total number pared=1. -Continue padding to afftected digits daily for protection. -Patient/POA to call should there be question/concern in the interim.   Return in about 9 weeks (around 09/28/2022).  Freddie Breech, DPM

## 2022-08-12 ENCOUNTER — Telehealth: Payer: Self-pay | Admitting: Cardiovascular Disease

## 2022-08-12 MED ORDER — AMLODIPINE BESYLATE 5 MG PO TABS: 5.0000 mg | ORAL_TABLET | Freq: Every day | ORAL | 1 refills | Status: DC

## 2022-08-12 NOTE — Telephone Encounter (Signed)
As mentioned below, pts Ramipril was discontinued by Dr. Clifton James on 8/1, due to pts K level 5.2 and elevated creatinine.  He was advised to stop ramipril completely and return for repeat bmet on 8/11.  Pt had bmet on 8/11 which revealed improvement in K level-4.4 and improved creatinine 1.61.  Pt states he has been off of ramipril since 8/2.   Pt states Dr. Clifton James advised him to continue monitoring his pressures at home while being off of ramipril, and notify our office if he noticed them trending up, so that an alternative regimen for ramipril could be advised on at that time.   Pt states he has been monitoring his pressures daily.  He states while he was on ramipril his pressures would run on average in the 130s systolic and 70s diastolic.  He states now that he is off ramipril, his average readings are running in the high 140s-150s systolic and 80s diastolic.  Pt would like for Dr. Clifton James to further advise if he needs to start another medication for his pressures, since ramipril is no longer in his regimen.  Pt aware that I will route this message to Dr. Clifton James to further review and advise on.  He is aware that a triage nurse will call him accordingly thereafter. Pt verbalized understanding and agrees with this plan.     Kathleene Hazel, MD  07/07/2022  9:52 AM EDT     Potassium now 5.2. he should stop the Altace completely. He will need a repeat BMET in one week. Follow BP at home. If needed, he can be started on something else for his HTN. Thayer Ohm

## 2022-08-12 NOTE — Telephone Encounter (Signed)
[place], Chaun Uemura" - 08/12/2022  9:45 AM Kathleene Hazel, MD  Sent: Wed August 12, 2022 11:18 AM  To: Loa Socks, LPN; Dossie Arbour, RN          Message  Can we have him start Norvasc 5 mg daily? Continue to follow BP and let us know in two weeks if still elevated. Chris     Pt aware that per Dr. Clifton James, we will start him on amlodipine 5 mg po daily.  Pt aware that he wants him to continue monitoring his pressures at home, and let us know in 2 weeks if this is still elevated while on this regimen.  Confirmed the pharmacy of choice with the pt. Pt verbalized understanding and agrees with this plan.  Pt was more than gracious for all the assistance provided.

## 2022-08-12 NOTE — Telephone Encounter (Signed)
Pt c/o medication issue:  1. Name of Medication: Ramipril  2. How are you currently taking this medication (dosage and times per day)? 1 time a take  3. Are you having a reaction (difficulty breathing--STAT)?   4. What is your medication issue? Patient wants to know if he should start back taking the Ramipril, if so, when does he start back

## 2022-09-28 ENCOUNTER — Ambulatory Visit: Payer: Medicare Other | Admitting: Podiatry

## 2022-10-05 ENCOUNTER — Telehealth (INDEPENDENT_AMBULATORY_CARE_PROVIDER_SITE_OTHER): Payer: Medicare Other | Admitting: Podiatry

## 2022-10-05 ENCOUNTER — Encounter: Payer: Self-pay | Admitting: Podiatry

## 2022-10-05 ENCOUNTER — Ambulatory Visit: Payer: Medicare Other | Admitting: Podiatry

## 2022-10-05 ENCOUNTER — Ambulatory Visit (INDEPENDENT_AMBULATORY_CARE_PROVIDER_SITE_OTHER): Payer: Medicare Other

## 2022-10-05 DIAGNOSIS — M79674 Pain in right toe(s): Secondary | ICD-10-CM

## 2022-10-05 DIAGNOSIS — E11621 Type 2 diabetes mellitus with foot ulcer: Secondary | ICD-10-CM

## 2022-10-05 DIAGNOSIS — M79675 Pain in left toe(s): Secondary | ICD-10-CM

## 2022-10-05 DIAGNOSIS — B351 Tinea unguium: Secondary | ICD-10-CM

## 2022-10-05 DIAGNOSIS — L97521 Non-pressure chronic ulcer of other part of left foot limited to breakdown of skin: Secondary | ICD-10-CM | POA: Diagnosis not present

## 2022-10-05 DIAGNOSIS — E1151 Type 2 diabetes mellitus with diabetic peripheral angiopathy without gangrene: Secondary | ICD-10-CM

## 2022-10-05 DIAGNOSIS — L84 Corns and callosities: Secondary | ICD-10-CM

## 2022-10-05 MED ORDER — GENTAMICIN SULFATE 0.1 % EX CREA: TOPICAL_CREAM | CUTANEOUS | 1 refills | Status: DC

## 2022-10-05 NOTE — Telephone Encounter (Signed)
Phoned patient to give him xray results of left foot. No bone erosion noted on xray of left 2nd metatarsal. Also verified patient's pharmacy. Sent Rx into [facility] on [address] for Gentamicin Cream 0.1% to be applied to diabetic ulcer once daily until he sees Dr. Sherryle Lis in two weeks for follow up.  1. Diabetic ulcer of left foot associated with type 2 diabetes mellitus, limited to breakdown of skin, unspecified part of foot ([place])   2. Type II diabetes mellitus with peripheral circulatory disorder (HCC)

## 2022-10-05 NOTE — Progress Notes (Signed)
Subjective:  Patient ID: [name], male    DOB: June 14, 1941,  MRN: [id number]  [name] presents to clinic today for  Chief Complaint  Patient presents with   Nail Problem    [facility]  BG - 90 ,this morning  A1C - 6.07 June 2022 PCP - Dr Welton Flakes , last OV July 2023  . New problem(s): Patient has preulcerative callus plantar aspect left foot. States he went on a trip to the mountains and walked quite a lot, so the area is a little sore on today. He noticed a couple of spots of blood on the floor this morning. He cleaned and dressed the area. He relates tenderness to lesion due to growth of the callus. He denies any redness, pus, swelling or odor. Denies any fever, chills, night sweats, nausea or vomiting.  PCP is Leonard Downing, MD.  No Known Allergies  Review of Systems: Negative except as noted in the HPI.  Objective: No changes noted in today's physical examination. IBRAHEM VOLKMAN is a pleasant 81 y.o. male obese in NAD. AAO x 3.  Vascular Examination: CFT <3 seconds b/l. DP pulses palpable b/l. PT pulses nonpalpable b/l. Digital hair absent. Skin temperature gradient warm to warm b/l. No pain with calf compression. No ischemia or gangrene. No cyanosis or clubbing noted b/l. +1 pitting edema bilateral ankles.   Neurological Examination: Sensation grossly intact b/l with 10 gram monofilament. Vibratory sensation intact b/l.   Dermatological Examination: Pedal skin warm and supple b/l. Toenails 1-5 b/l thick, discolored, elongated with subungual debris and pain on dorsal palpation.    Hyperkeratotic lesion(s) submet head 5 left foot.  No erythema, no edema, no drainage, no fluctuance.     Wound Location: submet head 2 left foot There is a moderate amount of devitalized tissue present in the wound. Predebridement Wound Measurement at 12 o'clock position:  2.5  x 2.0 cm. Postdebridement Wound Measurement: at 12 o'clock position 0.3 x 0.2 x 0.1 cm. Wound Base:  Granular/Healthy Peri-wound: Normal Exudate: None: wound tissue dry Blood Loss during debridement: 0 cc('s). Material in wound which inhibits healing/promotes adjacent tissue breakdown:  exuberant hyperkeratosis. Description of tissue removed from ulceration today:  nonviable hyperkeratosis. Sign(s) of clinical bacterial infection: no clinical signs of infection noted on examination today.  No purulent drainage noted. No odor noted.  Musculoskeletal Examination: Muscle strength 5/5 to b/l LE. 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  Xray findings left foot: No gas in tissues left foot. Plantar calcaneal spur noted left foot. Posterior calcaneal spur noted left foot. Laterally deviated hallux with medial deviation of 1st metatarsal left foot. Contracted digits left second digit. No bone erosion noted at location of ulceration 2nd metatarsal head left foot. No foreign body evident left foot. Decreased joint space noted 1st MPJ left foot, 2nd MPJ left foot, 3rd MPJ left foot, 4th MPJ left foot, and 5th MPJ left foot.   Assessment/Plan: 1. Pain due to onychomycosis of toenails of both feet   2. Diabetic ulcer of left foot associated with type 2 diabetes mellitus, limited to breakdown of skin, unspecified part of foot ([place])   3. Pre-ulcerative calluses   4. Type II diabetes mellitus with peripheral circulatory disorder (HCC)     Meds ordered this encounter  Medications   gentamicin cream (GARAMYCIN) 0.1 %    Sig: Apply to left foot ulcer once daily.    Dispense:  30 g  Refill:  1  -Plan: -Patient was evaluated and treated. All patient's and/or POA's questions/concerns answered on today's visit.  -Ulceration debridement achieved utilizing sharp excisional debridement with sterile scalpel blade.. Type/amount of devitalized tissue removed: nonviable hyperkeratosis -Today's ulcer size post-debridement: 0.3 x 0.2 x 0.1  cm. -Ulceration cleansed with wound cleanser. Betadine ointment applied to base of ulceration and secured with light offloaded dressing. -Wound responded well to today's debridement. -Patient risk factors affecting healing of ulcer: diabetes, history of foot/leg ulcer, h/o MI, HTN, CAD, hyperlipidemia -Bailee T Grunow given verbal instructions on daily wound care for submet head 2 left foot ulceration. Apply Gentamicin Cream 0.1% to ulcer once daily. Patient scheduled to see Dr. Sharl Ma in two weeks for follow up of diabetic foot ulcer submet head 2 left foot. -Frequency of debridements needed to achieve healing: biweekly -Wound culture and sensitivity ordered today for left foot ulceration. -Patient has Darco shoe at home and instructed to wear it until he sees Dr. Lilian Kapur. -Toenails 1-5 b/l were debrided in length and girth with sterile nail nippers and dremel without iatrogenic bleeding.  -Preulcerative lesion pared submet head 5 left foot utilizing sterile scalpel blade. Total number pared=1. -Patient notified of xray results via phone call at 12:13 pm today. -Patient/POA to call should there be question/concern in the interim.   Return in about 3 months (around 01/05/2023).  Freddie Breech, DPM

## 2022-10-09 LAB — WOUND CULTURE: Organism ID, Bacteria: NONE SEEN

## 2022-10-09 LAB — SPECIMEN STATUS REPORT

## 2022-11-26 ENCOUNTER — Emergency Department (HOSPITAL_BASED_OUTPATIENT_CLINIC_OR_DEPARTMENT_OTHER): Payer: Medicare Other | Admitting: Radiology

## 2022-11-26 ENCOUNTER — Other Ambulatory Visit: Payer: Self-pay

## 2022-11-26 ENCOUNTER — Encounter (HOSPITAL_BASED_OUTPATIENT_CLINIC_OR_DEPARTMENT_OTHER): Payer: Self-pay

## 2022-11-26 ENCOUNTER — Encounter (HOSPITAL_COMMUNITY): Payer: Self-pay

## 2022-11-26 ENCOUNTER — Emergency Department (HOSPITAL_BASED_OUTPATIENT_CLINIC_OR_DEPARTMENT_OTHER): Payer: Medicare Other

## 2022-11-26 ENCOUNTER — Inpatient Hospital Stay (HOSPITAL_BASED_OUTPATIENT_CLINIC_OR_DEPARTMENT_OTHER)
Admission: EM | Admit: 2022-11-26 | Discharge: 2022-11-29 | DRG: 308 | Disposition: A | Discharge status: Home / Self Care | Payer: Medicare Other | Attending: Internal Medicine | Admitting: Internal Medicine

## 2022-11-26 DIAGNOSIS — I5031 Acute diastolic (congestive) heart failure: Secondary | ICD-10-CM | POA: Diagnosis not present

## 2022-11-26 DIAGNOSIS — Z1152 Encounter for screening for COVID-19: Secondary | ICD-10-CM | POA: Diagnosis not present

## 2022-11-26 DIAGNOSIS — Z8673 Personal history of transient ischemic attack (TIA), and cerebral infarction without residual deficits: Secondary | ICD-10-CM

## 2022-11-26 DIAGNOSIS — Z6830 Body mass index (BMI) 30.0-30.9, adult: Secondary | ICD-10-CM | POA: Diagnosis not present

## 2022-11-26 DIAGNOSIS — Z86711 Personal history of pulmonary embolism: Secondary | ICD-10-CM

## 2022-11-26 DIAGNOSIS — I2510 Atherosclerotic heart disease of native coronary artery without angina pectoris: Secondary | ICD-10-CM | POA: Diagnosis not present

## 2022-11-26 DIAGNOSIS — Z9049 Acquired absence of other specified parts of digestive tract: Secondary | ICD-10-CM

## 2022-11-26 DIAGNOSIS — J101 Influenza due to other identified influenza virus with other respiratory manifestations: Secondary | ICD-10-CM

## 2022-11-26 DIAGNOSIS — I4891 Unspecified atrial fibrillation: Principal | ICD-10-CM | POA: Diagnosis present

## 2022-11-26 DIAGNOSIS — J1008 Influenza due to other identified influenza virus with other specified pneumonia: Secondary | ICD-10-CM | POA: Diagnosis not present

## 2022-11-26 DIAGNOSIS — Z7984 Long term (current) use of oral hypoglycemic drugs: Secondary | ICD-10-CM | POA: Diagnosis not present

## 2022-11-26 DIAGNOSIS — I483 Typical atrial flutter: Secondary | ICD-10-CM

## 2022-11-26 DIAGNOSIS — J9811 Atelectasis: Secondary | ICD-10-CM | POA: Diagnosis present

## 2022-11-26 DIAGNOSIS — Z794 Long term (current) use of insulin: Secondary | ICD-10-CM

## 2022-11-26 DIAGNOSIS — I130 Hypertensive heart and chronic kidney disease with heart failure and stage 1 through stage 4 chronic kidney disease, or unspecified chronic kidney disease: Secondary | ICD-10-CM | POA: Diagnosis present

## 2022-11-26 DIAGNOSIS — E1122 Type 2 diabetes mellitus with diabetic chronic kidney disease: Secondary | ICD-10-CM | POA: Diagnosis not present

## 2022-11-26 DIAGNOSIS — D649 Anemia, unspecified: Secondary | ICD-10-CM | POA: Diagnosis present

## 2022-11-26 DIAGNOSIS — E669 Obesity, unspecified: Secondary | ICD-10-CM | POA: Diagnosis not present

## 2022-11-26 DIAGNOSIS — Z79899 Other long term (current) drug therapy: Secondary | ICD-10-CM | POA: Diagnosis not present

## 2022-11-26 DIAGNOSIS — I10 Essential (primary) hypertension: Secondary | ICD-10-CM | POA: Diagnosis present

## 2022-11-26 DIAGNOSIS — Z955 Presence of coronary angioplasty implant and graft: Secondary | ICD-10-CM

## 2022-11-26 DIAGNOSIS — I480 Paroxysmal atrial fibrillation: Secondary | ICD-10-CM | POA: Diagnosis present

## 2022-11-26 DIAGNOSIS — N1832 Chronic kidney disease, stage 3b: Secondary | ICD-10-CM | POA: Insufficient documentation

## 2022-11-26 DIAGNOSIS — E785 Hyperlipidemia, unspecified: Secondary | ICD-10-CM | POA: Diagnosis present

## 2022-11-26 DIAGNOSIS — J111 Influenza due to unidentified influenza virus with other respiratory manifestations: Secondary | ICD-10-CM

## 2022-11-26 DIAGNOSIS — R7989 Other specified abnormal findings of blood chemistry: Secondary | ICD-10-CM

## 2022-11-26 DIAGNOSIS — I252 Old myocardial infarction: Secondary | ICD-10-CM

## 2022-11-26 DIAGNOSIS — R079 Chest pain, unspecified: Secondary | ICD-10-CM | POA: Diagnosis present

## 2022-11-26 DIAGNOSIS — I2489 Other forms of acute ischemic heart disease: Secondary | ICD-10-CM | POA: Diagnosis not present

## 2022-11-26 DIAGNOSIS — E119 Type 2 diabetes mellitus without complications: Secondary | ICD-10-CM

## 2022-11-26 LAB — COMPREHENSIVE METABOLIC PANEL
ALT: 10 U/L (ref 0–44)
AST: 11 U/L — ABNORMAL LOW (ref 15–41)
Albumin: 4 g/dL (ref 3.5–5.0)
Alkaline Phosphatase: 59 U/L (ref 38–126)
Anion gap: 9 (ref 5–15)
BUN: 26 mg/dL — ABNORMAL HIGH (ref 8–23)
CO2: 28 mmol/L (ref 22–32)
Calcium: 8.6 mg/dL — ABNORMAL LOW (ref 8.9–10.3)
Chloride: 103 mmol/L (ref 98–111)
Creatinine, Ser: 1.7 mg/dL — ABNORMAL HIGH (ref 0.61–1.24)
GFR, Estimated: 40 mL/min — ABNORMAL LOW (ref >60)
Glucose, Bld: 185 mg/dL — ABNORMAL HIGH (ref 70–99)
Potassium: 4.7 mmol/L (ref 3.5–5.1)
Sodium: 140 mmol/L (ref 135–145)
Total Bilirubin: 0.8 mg/dL (ref 0.3–1.2)
Total Protein: 6.7 g/dL (ref 6.5–8.1)

## 2022-11-26 LAB — CBC WITH DIFFERENTIAL/PLATELET
Abs Immature Granulocytes: 0.01 10*3/uL (ref 0.00–0.07)
Basophils Absolute: 0 10*3/uL (ref 0.0–0.1)
Basophils Relative: 0 %
Eosinophils Absolute: 0.3 10*3/uL (ref 0.0–0.5)
Eosinophils Relative: 6 %
HCT: 36.5 % — ABNORMAL LOW (ref 39.0–52.0)
Hemoglobin: 11.8 g/dL — ABNORMAL LOW (ref 13.0–17.0)
Immature Granulocytes: 0 %
Lymphocytes Relative: 21 %
Lymphs Abs: 1.2 10*3/uL (ref 0.7–4.0)
MCH: 31.1 pg (ref 26.0–34.0)
MCHC: 32.3 g/dL (ref 30.0–36.0)
MCV: 96.3 fL (ref 80.0–100.0)
Monocytes Absolute: 0.5 10*3/uL (ref 0.1–1.0)
Monocytes Relative: 8 %
Neutro Abs: 3.9 10*3/uL (ref 1.7–7.7)
Neutrophils Relative %: 65 %
Platelets: 206 10*3/uL (ref 150–400)
RBC: 3.79 MIL/uL — ABNORMAL LOW (ref 4.22–5.81)
RDW: 13.5 % (ref 11.5–15.5)
WBC: 6 10*3/uL (ref 4.0–10.5)
nRBC: 0 % (ref 0.0–0.2)

## 2022-11-26 LAB — RESP PANEL BY RT-PCR (RSV, FLU A&B, COVID)  RVPGX2
Influenza A by PCR: POSITIVE — AB
Influenza B by PCR: NEGATIVE
Resp Syncytial Virus by PCR: NEGATIVE
SARS Coronavirus 2 by RT PCR: NEGATIVE

## 2022-11-26 LAB — CBG MONITORING, ED
Glucose-Capillary: 72 mg/dL (ref 70–99)
Glucose-Capillary: 91 mg/dL (ref 70–99)
Glucose-Capillary: 117 mg/dL — ABNORMAL HIGH (ref 70–99)

## 2022-11-26 LAB — D-DIMER, QUANTITATIVE: D-Dimer, Quant: 0.74 ug/mL-FEU — ABNORMAL HIGH (ref 0.00–0.50)

## 2022-11-26 LAB — TROPONIN I (HIGH SENSITIVITY)
Troponin I (High Sensitivity): 74 ng/L — ABNORMAL HIGH (ref <18)
Troponin I (High Sensitivity): 87 ng/L — ABNORMAL HIGH (ref <18)

## 2022-11-26 LAB — BRAIN NATRIURETIC PEPTIDE: B Natriuretic Peptide: 303.1 pg/mL — ABNORMAL HIGH (ref 0.0–100.0)

## 2022-11-26 MED ORDER — ASPIRIN 81 MG PO TBEC
81.0000 mg | DELAYED_RELEASE_TABLET | Freq: Every day | ORAL | Status: DC
  Administered 2022-11-27: 81 mg via ORAL
  Filled 2022-11-26: qty 1

## 2022-11-26 MED ORDER — METFORMIN HCL 500 MG PO TABS: 1000.0000 mg | ORAL_TABLET | Freq: Two times a day (BID) | ORAL | Status: DC

## 2022-11-26 MED ORDER — MELATONIN 5 MG PO TABS: 10.0000 mg | ORAL_TABLET | Freq: Every evening | ORAL | Status: DC | PRN

## 2022-11-26 MED ORDER — AMLODIPINE BESYLATE 5 MG PO TABS
5.0000 mg | ORAL_TABLET | Freq: Every day | ORAL | Status: DC
  Administered 2022-11-27: 5 mg via ORAL
  Filled 2022-11-26 (×2): qty 1

## 2022-11-26 MED ORDER — INSULIN GLARGINE-YFGN 100 UNIT/ML ~~LOC~~ SOLN
10.0000 [IU] | Freq: Every day | SUBCUTANEOUS | Status: DC
  Administered 2022-11-27 – 2022-11-28 (×2): 10 [IU] via SUBCUTANEOUS
  Filled 2022-11-26 (×4): qty 0.1

## 2022-11-26 MED ORDER — ONDANSETRON HCL 4 MG PO TABS: 4.0000 mg | ORAL_TABLET | Freq: Four times a day (QID) | ORAL | Status: DC | PRN

## 2022-11-26 MED ORDER — APIXABAN 5 MG PO TABS
5.0000 mg | ORAL_TABLET | Freq: Two times a day (BID) | ORAL | Status: DC
  Administered 2022-11-27 – 2022-11-29 (×6): 5 mg via ORAL
  Filled 2022-11-26 (×6): qty 1

## 2022-11-26 MED ORDER — INSULIN ASPART 100 UNIT/ML IJ SOLN
0.0000 [IU] | Freq: Three times a day (TID) | INTRAMUSCULAR | Status: DC
  Administered 2022-11-27: 3 [IU] via SUBCUTANEOUS
  Administered 2022-11-27 – 2022-11-28 (×2): 5 [IU] via SUBCUTANEOUS
  Administered 2022-11-28: 3 [IU] via SUBCUTANEOUS
  Administered 2022-11-28 – 2022-11-29 (×2): 8 [IU] via SUBCUTANEOUS
  Administered 2022-11-29: 3 [IU] via SUBCUTANEOUS

## 2022-11-26 MED ORDER — IOHEXOL 350 MG/ML SOLN
100.0000 mL | Freq: Once | INTRAVENOUS | Status: AC | PRN
  Administered 2022-11-26: 75 mL via INTRAVENOUS

## 2022-11-26 MED ORDER — ACETAMINOPHEN 650 MG RE SUPP: 650.0000 mg | Freq: Four times a day (QID) | RECTAL | Status: DC | PRN

## 2022-11-26 MED ORDER — CLOPIDOGREL BISULFATE 75 MG PO TABS
75.0000 mg | ORAL_TABLET | Freq: Every day | ORAL | Status: DC
  Filled 2022-11-26: qty 1

## 2022-11-26 MED ORDER — GUAIFENESIN-DM 100-10 MG/5ML PO SYRP: 15.0000 mL | ORAL_SOLUTION | ORAL | Status: DC | PRN

## 2022-11-26 MED ORDER — PRAVASTATIN SODIUM 40 MG PO TABS
40.0000 mg | ORAL_TABLET | Freq: Every evening | ORAL | Status: DC
  Administered 2022-11-27 – 2022-11-28 (×2): 40 mg via ORAL
  Filled 2022-11-26 (×3): qty 1

## 2022-11-26 MED ORDER — ONDANSETRON HCL 4 MG/2ML IJ SOLN: 4.0000 mg | Freq: Four times a day (QID) | INTRAMUSCULAR | Status: DC | PRN

## 2022-11-26 MED ORDER — ALBUTEROL SULFATE (2.5 MG/3ML) 0.083% IN NEBU: 2.5000 mg | INHALATION_SOLUTION | RESPIRATORY_TRACT | Status: DC | PRN

## 2022-11-26 MED ORDER — METOPROLOL TARTRATE 25 MG PO TABS
25.0000 mg | ORAL_TABLET | Freq: Two times a day (BID) | ORAL | Status: DC
  Administered 2022-11-26 – 2022-11-27 (×4): 25 mg via ORAL
  Filled 2022-11-26 (×5): qty 1

## 2022-11-26 MED ORDER — ACETAMINOPHEN 325 MG PO TABS: 650.0000 mg | ORAL_TABLET | Freq: Four times a day (QID) | ORAL | Status: DC | PRN

## 2022-11-26 NOTE — ED Triage Notes (Signed)
Pt to ED c/o SHOB X 1 WEEK, Worse with exertion, also reports cough, possible sick contacts.

## 2022-11-26 NOTE — Assessment & Plan Note (Signed)
Stable

## 2022-11-26 NOTE — ED Provider Notes (Signed)
[facility] EMERGENCY DEPT Provider Note   CSN: [id number] Arrival date & time: 11/26/22  7253     History  Chief Complaint  Patient presents with   Shortness of Breath   Cough    Timothy [name] is a 81 y.o. male.  Patient is a 81 year old male with a history of coronary artery disease status post stent placement in 2001 in 2003, hypertension, hyperlipidemia, diabetes and prior pulmonary embolus in 2014.  He was on Coumadin for a period of time but is no longer on anticoagulants.  Who presents with shortness of breath.  He says it has been worsening over the last week.  He has had a little bit of cough with some yellow sputum production and a little bit of nasal congestion.  No wheezing.  He has had some increased swelling of his legs.  No known fevers.  He does report some intermittent tightness across his chest.  No nausea or vomiting.       Home Medications Prior to Admission medications   Medication Sig Start Date End Date Taking? Authorizing Provider  amLODipine (NORVASC) 5 MG tablet Take 1 tablet (5 mg total) by mouth daily. 08/12/22   Kathleene Hazel, MD  clopidogrel (PLAVIX) 75 MG tablet TAKE 1 TABLET BY MOUTH  DAILY 06/17/22   Kathleene Hazel, MD  gentamicin cream (GARAMYCIN) 0.1 % Apply to left foot ulcer once daily. 10/05/22   Freddie Breech, DPM  insulin NPH-regular Human (NOVOLIN 70/30) (70-30) 100 UNIT/ML injection Inject into the skin.    [provider]  loratadine (CLARITIN) 10 MG tablet Take 10 mg by mouth daily as needed for allergies.    [provider]  metFORMIN (GLUCOPHAGE) 1000 MG tablet Take 1,000 mg by mouth 2 (two) times daily with a meal.    [provider]  metoprolol tartrate (LOPRESSOR) 25 MG tablet TAKE 1 TABLET BY MOUTH  TWICE DAILY 06/17/22   Kathleene Hazel, MD  [facility] ULTRA test strip 1 each daily. 08/05/20   [provider]  pravastatin (PRAVACHOL) 40 MG tablet TAKE 1  TABLET BY MOUTH IN  THE EVENING 06/17/22   Kathleene Hazel, MD  RELION INSULIN SYRINGE 31G X 15/64" 1 ML MISC USE 1 TWICE DAILY TO INJECT A MAX OF 85 UNITS LABS IN APRIL 06/29/20   [provider]  traMADol Janean Sark) 50 MG tablet Take by mouth. 07/02/22   [provider]      Allergies    Patient has no known allergies.    Review of Systems   Review of Systems  Constitutional:  Positive for fatigue. Negative for chills, diaphoresis and fever.  HENT:  Positive for congestion. Negative for rhinorrhea and sneezing.   Eyes: Negative.   Respiratory:  Positive for cough, chest tightness and shortness of breath.   Cardiovascular:  Positive for leg swelling. Negative for chest pain.  Gastrointestinal:  Negative for abdominal pain, blood in stool, diarrhea, nausea and vomiting.  Genitourinary:  Negative for difficulty urinating, flank pain, frequency and hematuria.  Musculoskeletal:  Negative for arthralgias and back pain.  Skin:  Negative for rash.  Neurological:  Negative for dizziness, speech difficulty, weakness, numbness and headaches.    Physical Exam Updated Vital Signs BP (!) 144/81 (BP Location: Right Arm)   Pulse 96   Temp 98 F (36.7 C) (Oral)   Resp 15   Ht 6\' 2"  (1.88 m)   Wt 113.4 kg   SpO2 97%   BMI  32.10 kg/m  Physical Exam Constitutional:      Appearance: He is well-developed.  HENT:     Head: Normocephalic and atraumatic.  Eyes:     Pupils: Pupils are equal, round, and reactive to light.  Cardiovascular:     Rate and Rhythm: Normal rate and regular rhythm.     Heart sounds: Normal heart sounds.  Pulmonary:     Effort: Pulmonary effort is normal. No respiratory distress.     Breath sounds: Rhonchi present. No wheezing or rales.  Chest:     Chest wall: No tenderness.  Abdominal:     General: Bowel sounds are normal.     Palpations: Abdomen is soft.     Tenderness: There is no abdominal tenderness. There is no guarding or rebound.   Musculoskeletal:        General: Normal range of motion.     Cervical back: Normal range of motion and neck supple.     Comments: 3+ edema to lower extremities bilaterally with the right leg being more swollen than the left leg  Lymphadenopathy:     Cervical: No cervical adenopathy.  Skin:    General: Skin is warm and dry.     Findings: No rash.  Neurological:     Mental Status: He is alert and oriented to person, place, and time.     ED Results / Procedures / Treatments   Labs (all labs ordered are listed, but only abnormal results are displayed) Labs Reviewed  RESP PANEL BY RT-PCR (RSV, FLU A&B, COVID)  RVPGX2 - Abnormal; Notable for the following components:      Result Value   Influenza A by PCR POSITIVE (*)    All other components within normal limits  BRAIN NATRIURETIC PEPTIDE - Abnormal; Notable for the following components:   B Natriuretic Peptide 303.1 (*)    All other components within normal limits  COMPREHENSIVE METABOLIC PANEL - Abnormal; Notable for the following components:   Glucose, Bld 185 (*)    BUN 26 (*)    Creatinine, Ser 1.70 (*)    Calcium 8.6 (*)    AST 11 (*)    GFR, Estimated 40 (*)    All other components within normal limits  CBC WITH DIFFERENTIAL/PLATELET - Abnormal; Notable for the following components:   RBC 3.79 (*)    Hemoglobin 11.8 (*)    HCT 36.5 (*)    All other components within normal limits  D-DIMER, QUANTITATIVE - Abnormal; Notable for the following components:   D-Dimer, Quant 0.74 (*)    All other components within normal limits  TROPONIN I (HIGH SENSITIVITY) - Abnormal; Notable for the following components:   Troponin I (High Sensitivity) 74 (*)    All other components within normal limits  TROPONIN I (HIGH SENSITIVITY) - Abnormal; Notable for the following components:   Troponin I (High Sensitivity) 87 (*)    All other components within normal limits  CBG MONITORING, ED    EKG EKG Interpretation  Date/Time:  Thursday  November 26 2022 09:05:46 EST Ventricular Rate:  81 PR Interval:    QRS Duration: 126 QT Interval:  385 QTC Calculation: 447 R Axis:   -61 Text Interpretation: Atrial 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 Cough. Clinical concern for pulmonary embolus. EXAM: CT  ANGIOGRAPHY CHEST WITH CONTRAST TECHNIQUE: Multidetector CT imaging of the chest was performed using the standard protocol during bolus administration of intravenous contrast. Multiplanar CT image reconstructions and MIPs were obtained to evaluate the vascular anatomy. RADIATION DOSE REDUCTION: This exam was performed according to the departmental dose-optimization program which includes automated exposure control, adjustment of the mA and/or kV according to patient size and/or use of iterative reconstruction technique. CONTRAST:  76mL OMNIPAQUE IOHEXOL 350 MG/ML SOLN COMPARISON:  07/19/2013 FINDINGS: Cardiovascular: The heart size is normal. No substantial pericardial effusion. Coronary artery calcification is evident. Mild atherosclerotic calcification is noted in the wall of the thoracic aorta. There is no filling defect within the opacified pulmonary arteries to suggest the presence of an acute pulmonary embolus. Mediastinum/Nodes: No mediastinal lymphadenopathy. There is no hilar lymphadenopathy. The esophagus has normal imaging features. There is no axillary lymphadenopathy. Lungs/Pleura: Patchy tree-in-bud nodularity seen posterior right upper lobe , right middle lobe and posterior left upper lobe. Subsegmental atelectasis noted both lower lobes. No dense focal airspace consolidation. No pleural effusion. Upper Abdomen: Tiny layering calcified gallstones evident. The liver shows diffusely decreased attenuation suggesting fat deposition.  Musculoskeletal: No worrisome lytic or sclerotic osseous abnormality. Review of the MIP images confirms the above findings. IMPRESSION: 1. No CT evidence for acute pulmonary embolus. 2. Patchy tree-in-bud nodularity in the posterior right upper lobe, right middle lobe, and posterior left upper lobe. Imaging features compatible with infectious/inflammatory etiology with atypical infection a distinct consideration. 3. Cholelithiasis. 4. Hepatic steatosis. 5.  Aortic Atherosclerosis (ICD10-I70.0). Electronically Signed   By: Misty Stanley M.D.   On: 11/26/2022 12:05   US Venous Img Lower Right (DVT Study)  Result Date: 11/26/2022 CLINICAL DATA:  Short of breath, right lower extremity pain EXAM: RIGHT LOWER EXTREMITY VENOUS DOPPLER ULTRASOUND TECHNIQUE: Gray-scale sonography with compression, as well as color and duplex ultrasound, were performed to evaluate the deep venous system(s) from the level of the common femoral vein through the popliteal and proximal calf veins. COMPARISON:  None Available. FINDINGS: VENOUS Normal compressibility of the common femoral, superficial femoral, and popliteal veins, as well as the visualized calf veins. Visualized portions of profunda femoral vein and great saphenous vein unremarkable. No filling defects to suggest DVT on grayscale or color Doppler imaging. Doppler waveforms show normal direction of venous flow, normal respiratory plasticity and response to augmentation. Limited views of the contralateral common femoral vein are unremarkable. OTHER None. Limitations: none IMPRESSION: Negative. Electronically Signed   By: Jacqulynn Cadet M.D.   On: 11/26/2022 10:24   DG Chest 2 View  Result Date: 11/26/2022 CLINICAL DATA:  Provided history: Shortness of breath, right leg swelling. Productive cough. Sick contacts. EXAM: CHEST - 2 VIEW COMPARISON:  Prior chest radiographs 07/06/2013 and earlier. Chest CT 07/19/2013. FINDINGS: Shallow inspiration radiograph. Heart size within  normal limits. Aortic atherosclerosis. Small linear opacities within the left lung base, which may reflect atelectasis or scarring. No appreciable airspace consolidation. No evidence of pleural effusion or pneumothorax. Degenerative changes of the spine. IMPRESSION: 1. Shallow inspiration radiograph. 2. Small linear opacities within the left lung base, which may reflect atelectasis or scarring. 3. Otherwise, no evidence of acute cardiopulmonary abnormality. 4. Aortic Atherosclerosis (ICD10-I70.0). Electronically Signed   By: Kellie Simmering D.O.   On: 11/26/2022 10:05    Procedures Procedures    Medications Ordered in ED Medications  iohexol (OMNIPAQUE) 350 MG/ML injection 100 mL (75 mLs Intravenous Contrast Given 11/26/22 1149)    ED Course/ Medical Decision Making/ A&P  Medical Decision Making Amount and/or Complexity of Data Reviewed Labs: ordered. Radiology: ordered.  Risk Prescription drug management. Decision regarding hospitalization.   Patient is a 81 year old male who presents with shortness of breath.  He also has some intermittent tightness across his chest but no current chest pain.  His EKG does not show any ischemic changes.  However he does have new onset A-fib.  His rate is controlled.  He does note that over the last few days he has noticed on heart monitor he has that his heart rate has been irregular.  He does not have a prior history of A-fib.  His troponins are mildly elevated.  Suspect this is from demand ischemia.  His influenza test is positive.  Chest x-ray does not show any evidence of overt fluid overload.  This was interpreted by me and confirmed by the radiologist.  His BNP is mildly elevated and he has some increased swelling in his legs.  Ultrasound of his right leg was performed which shows no evidence of DVT.  His D-dimer was a bit elevated.  A CT of his chest which shows no evidence of pneumonia.  There is evidence of atypical infection  and this is likely related to his influenza.  He is afebrile.  His WBC count is normal.  Given his elevated troponins and new onset A-fib, will plan admission.  I spoke with Dr. Trilby Drummer who will admit the pt.  Final Clinical Impression(s) / ED Diagnoses Final diagnoses:  New onset atrial fibrillation ([place])  Elevated troponin  Influenza    Rx / DC Orders ED Discharge Orders     None         Malvin Johns, MD 11/26/22 1428

## 2022-11-26 NOTE — Assessment & Plan Note (Signed)
Stable. Continue lopressor 25 mg bid, norvasc 5 mg daily.

## 2022-11-26 NOTE — Assessment & Plan Note (Signed)
Stable. Send iron profile.

## 2022-11-26 NOTE — ED Notes (Signed)
Called [facility] and spoke to [place]; informed that Hospitalist needs to be contacted again

## 2022-11-26 NOTE — Assessment & Plan Note (Addendum)
Observation telemetry bed. Pt already on lopressor at home. Not in rapid afib. Start Eliquis 5 mg bid. Change plavix over to asa to reduce risk of GI bleeding. Pt states he can take ASA 81 without difficulty. Check echo. Check TSH. Cards consult in AM. Cycle troponins. Check lipid panel. Pt denies any chest pain. Keep serum potassium >4.0 and serum magnesium > 2.0

## 2022-11-26 NOTE — Consult Note (Signed)
RT note: Pt. given snacks after checking with MD/RN, is being admitted with recent Blood Sugar 72 @12 :15.
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
## 2022-11-26 NOTE — ED Notes (Signed)
CBG 117 

## 2022-11-26 NOTE — Assessment & Plan Note (Signed)
Stable add SSI. Hold metformin. Add lantus. Check A1c.

## 2022-11-26 NOTE — Assessment & Plan Note (Signed)
Pt has had symptoms of SOB, cough for at least one week. No utility in starting tamiflu at this point. Continue with supportive care. CTPA does not show any pneumonia. Abx are not indicated.

## 2022-11-26 NOTE — Subjective & Objective (Signed)
CC: SOB, cough x 1 week HPI: 81 year old male history of type 2 diabetes on insulin, essential hypertension, coronary disease status post stent, hyperlipidemia presents to the ER today with a 7-day history of shortness of breath, cough.  He states that he thinks he caught a cold from one of his friends.  He has not had any fevers.  He has had some lower extreme edema.  Denies any palpitations.  Specifically denies any chest pain.  He noted some edema in his right leg.  Patient had a pulmonary embolism after surgery several years ago.  He was on Coumadin for short time but is no longer on anticoagulation.  To his knowledge, he has never had any arrhythmias such as atrial fibrillation or a flutter.  Patient brought to the ER by his family.  On arrival temp 97.9 heart rate 93 blood pressure 130/78 satting 100% on room air.  Labs showed positive influenza A negative COVID-negative RSV.  BUN of 26, creatinine 1.7 White count of 6, hemoglobin 11.8, platelet 206  D-dimer slightly elevated 0.74  CTPA performed which was negative for PE.  Showed some atelectasis but no overt pneumonia.  Right lower extremity ultrasound was negative for DVT.  His EKG did show rate controlled a flutter BNP was elevated at 303.  He has no baseline BNP.  Initial troponin of 74, repeat of 87.  Tried hospitalist contacted for admission.
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
## 2022-11-26 NOTE — Assessment & Plan Note (Signed)
Stop plavix. Start asa 81 mg daily(to reduce risk of GI bleeding while on Eliquis). Continue lopressor 25 mg bid. Continue pravastatin 40 mg daily. Defer ischemic workup up to cardiology.

## 2022-11-26 NOTE — Assessment & Plan Note (Signed)
Continue pravastatin 40 mg qhs.

## 2022-11-26 NOTE — ED Notes (Signed)
RT note: Repeat Troponin drawn, labelled and given to lab, RN aware.

## 2022-11-27 ENCOUNTER — Other Ambulatory Visit (HOSPITAL_COMMUNITY): Payer: Self-pay

## 2022-11-27 ENCOUNTER — Encounter (HOSPITAL_COMMUNITY): Payer: Self-pay | Admitting: Internal Medicine

## 2022-11-27 ENCOUNTER — Observation Stay (HOSPITAL_COMMUNITY): Payer: Medicare Other

## 2022-11-27 DIAGNOSIS — I130 Hypertensive heart and chronic kidney disease with heart failure and stage 1 through stage 4 chronic kidney disease, or unspecified chronic kidney disease: Secondary | ICD-10-CM | POA: Diagnosis present

## 2022-11-27 DIAGNOSIS — J101 Influenza due to other identified influenza virus with other respiratory manifestations: Secondary | ICD-10-CM | POA: Diagnosis present

## 2022-11-27 DIAGNOSIS — Z86711 Personal history of pulmonary embolism: Secondary | ICD-10-CM | POA: Diagnosis not present

## 2022-11-27 DIAGNOSIS — I483 Typical atrial flutter: Secondary | ICD-10-CM

## 2022-11-27 DIAGNOSIS — I2510 Atherosclerotic heart disease of native coronary artery without angina pectoris: Secondary | ICD-10-CM | POA: Diagnosis present

## 2022-11-27 DIAGNOSIS — I2489 Other forms of acute ischemic heart disease: Secondary | ICD-10-CM | POA: Diagnosis present

## 2022-11-27 DIAGNOSIS — Z955 Presence of coronary angioplasty implant and graft: Secondary | ICD-10-CM | POA: Diagnosis not present

## 2022-11-27 DIAGNOSIS — I4891 Unspecified atrial fibrillation: Secondary | ICD-10-CM

## 2022-11-27 DIAGNOSIS — J1008 Influenza due to other identified influenza virus with other specified pneumonia: Secondary | ICD-10-CM | POA: Diagnosis present

## 2022-11-27 DIAGNOSIS — Z794 Long term (current) use of insulin: Secondary | ICD-10-CM | POA: Diagnosis not present

## 2022-11-27 DIAGNOSIS — I252 Old myocardial infarction: Secondary | ICD-10-CM | POA: Diagnosis not present

## 2022-11-27 DIAGNOSIS — I480 Paroxysmal atrial fibrillation: Secondary | ICD-10-CM | POA: Diagnosis not present

## 2022-11-27 DIAGNOSIS — Z6830 Body mass index (BMI) 30.0-30.9, adult: Secondary | ICD-10-CM | POA: Diagnosis not present

## 2022-11-27 DIAGNOSIS — Z1152 Encounter for screening for COVID-19: Secondary | ICD-10-CM | POA: Diagnosis not present

## 2022-11-27 DIAGNOSIS — E1122 Type 2 diabetes mellitus with diabetic chronic kidney disease: Secondary | ICD-10-CM | POA: Diagnosis present

## 2022-11-27 DIAGNOSIS — E782 Mixed hyperlipidemia: Secondary | ICD-10-CM

## 2022-11-27 DIAGNOSIS — Z7984 Long term (current) use of oral hypoglycemic drugs: Secondary | ICD-10-CM | POA: Diagnosis not present

## 2022-11-27 DIAGNOSIS — I10 Essential (primary) hypertension: Secondary | ICD-10-CM | POA: Diagnosis not present

## 2022-11-27 DIAGNOSIS — Z9049 Acquired absence of other specified parts of digestive tract: Secondary | ICD-10-CM | POA: Diagnosis not present

## 2022-11-27 DIAGNOSIS — Z79899 Other long term (current) drug therapy: Secondary | ICD-10-CM | POA: Diagnosis not present

## 2022-11-27 DIAGNOSIS — D649 Anemia, unspecified: Secondary | ICD-10-CM | POA: Diagnosis present

## 2022-11-27 DIAGNOSIS — I5031 Acute diastolic (congestive) heart failure: Secondary | ICD-10-CM | POA: Diagnosis present

## 2022-11-27 DIAGNOSIS — J9811 Atelectasis: Secondary | ICD-10-CM | POA: Diagnosis present

## 2022-11-27 DIAGNOSIS — R079 Chest pain, unspecified: Secondary | ICD-10-CM | POA: Diagnosis present

## 2022-11-27 DIAGNOSIS — E669 Obesity, unspecified: Secondary | ICD-10-CM | POA: Diagnosis present

## 2022-11-27 DIAGNOSIS — E785 Hyperlipidemia, unspecified: Secondary | ICD-10-CM | POA: Diagnosis present

## 2022-11-27 DIAGNOSIS — N1832 Chronic kidney disease, stage 3b: Secondary | ICD-10-CM | POA: Diagnosis present

## 2022-11-27 DIAGNOSIS — Z8673 Personal history of transient ischemic attack (TIA), and cerebral infarction without residual deficits: Secondary | ICD-10-CM | POA: Diagnosis not present

## 2022-11-27 LAB — COMPREHENSIVE METABOLIC PANEL
ALT: 14 U/L (ref 0–44)
AST: 12 U/L — ABNORMAL LOW (ref 15–41)
Albumin: 3.4 g/dL — ABNORMAL LOW (ref 3.5–5.0)
Alkaline Phosphatase: 57 U/L (ref 38–126)
Anion gap: 6 (ref 5–15)
BUN: 19 mg/dL (ref 8–23)
CO2: 28 mmol/L (ref 22–32)
Calcium: 8.6 mg/dL — ABNORMAL LOW (ref 8.9–10.3)
Chloride: 106 mmol/L (ref 98–111)
Creatinine, Ser: 1.61 mg/dL — ABNORMAL HIGH (ref 0.61–1.24)
GFR, Estimated: 42 mL/min — ABNORMAL LOW (ref >60)
Glucose, Bld: 142 mg/dL — ABNORMAL HIGH (ref 70–99)
Potassium: 4.2 mmol/L (ref 3.5–5.1)
Sodium: 140 mmol/L (ref 135–145)
Total Bilirubin: 1 mg/dL (ref 0.3–1.2)
Total Protein: 6.7 g/dL (ref 6.5–8.1)

## 2022-11-27 LAB — LIPID PANEL
Cholesterol: 109 mg/dL (ref 0–200)
HDL: 29 mg/dL — ABNORMAL LOW (ref >40)
LDL Cholesterol: 65 mg/dL (ref 0–99)
Total CHOL/HDL Ratio: 3.8 RATIO
Triglycerides: 77 mg/dL (ref <150)
VLDL: 15 mg/dL (ref 0–40)

## 2022-11-27 LAB — CBC WITH DIFFERENTIAL/PLATELET
Abs Immature Granulocytes: 0.03 10*3/uL (ref 0.00–0.07)
Basophils Absolute: 0 10*3/uL (ref 0.0–0.1)
Basophils Relative: 0 %
Eosinophils Absolute: 0.3 10*3/uL (ref 0.0–0.5)
Eosinophils Relative: 4 %
HCT: 37.2 % — ABNORMAL LOW (ref 39.0–52.0)
Hemoglobin: 12.3 g/dL — ABNORMAL LOW (ref 13.0–17.0)
Immature Granulocytes: 0 %
Lymphocytes Relative: 22 %
Lymphs Abs: 1.7 10*3/uL (ref 0.7–4.0)
MCH: 31.3 pg (ref 26.0–34.0)
MCHC: 33.1 g/dL (ref 30.0–36.0)
MCV: 94.7 fL (ref 80.0–100.0)
Monocytes Absolute: 0.6 10*3/uL (ref 0.1–1.0)
Monocytes Relative: 8 %
Neutro Abs: 5.1 10*3/uL (ref 1.7–7.7)
Neutrophils Relative %: 66 %
Platelets: 220 10*3/uL (ref 150–400)
RBC: 3.93 MIL/uL — ABNORMAL LOW (ref 4.22–5.81)
RDW: 13.3 % (ref 11.5–15.5)
WBC: 7.7 10*3/uL (ref 4.0–10.5)
nRBC: 0 % (ref 0.0–0.2)

## 2022-11-27 LAB — MAGNESIUM: Magnesium: 2.2 mg/dL (ref 1.7–2.4)

## 2022-11-27 LAB — IRON AND TIBC
Iron: 56 ug/dL (ref 45–182)
Saturation Ratios: 17 % — ABNORMAL LOW (ref 17.9–39.5)
TIBC: 333 ug/dL (ref 250–450)
UIBC: 277 ug/dL

## 2022-11-27 LAB — GLUCOSE, CAPILLARY
Glucose-Capillary: 121 mg/dL — ABNORMAL HIGH (ref 70–99)
Glucose-Capillary: 223 mg/dL — ABNORMAL HIGH (ref 70–99)
Glucose-Capillary: 160 mg/dL — ABNORMAL HIGH (ref 70–99)
Glucose-Capillary: 189 mg/dL — ABNORMAL HIGH (ref 70–99)

## 2022-11-27 LAB — ECHOCARDIOGRAM COMPLETE
Area-P 1/2: 4.05 cm2
Height: 74 in
S' Lateral: 3.1 cm
Weight: 4000 oz

## 2022-11-27 LAB — TSH: TSH: 3.75 u[IU]/mL (ref 0.350–4.500)

## 2022-11-27 LAB — TROPONIN I (HIGH SENSITIVITY): Troponin I (High Sensitivity): 77 ng/L — ABNORMAL HIGH (ref <18)

## 2022-11-27 LAB — PROCALCITONIN: Procalcitonin: <0.1 ng/mL

## 2022-11-27 MED ORDER — OSELTAMIVIR PHOSPHATE 30 MG PO CAPS
30.0000 mg | ORAL_CAPSULE | Freq: Two times a day (BID) | ORAL | Status: DC
  Administered 2022-11-28 – 2022-11-29 (×3): 30 mg via ORAL
  Filled 2022-11-27 (×4): qty 1

## 2022-11-27 MED ORDER — POTASSIUM CHLORIDE CRYS ER 20 MEQ PO TBCR
40.0000 meq | EXTENDED_RELEASE_TABLET | Freq: Once | ORAL | Status: AC
  Administered 2022-11-27: 40 meq via ORAL
  Filled 2022-11-27: qty 2

## 2022-11-27 MED ORDER — FUROSEMIDE 10 MG/ML IJ SOLN
40.0000 mg | Freq: Two times a day (BID) | INTRAMUSCULAR | Status: AC
  Administered 2022-11-27 (×2): 40 mg via INTRAVENOUS
  Filled 2022-11-27 (×2): qty 4

## 2022-11-27 MED ORDER — ASPIRIN 81 MG PO CHEW
81.0000 mg | CHEWABLE_TABLET | Freq: Every day | ORAL | Status: DC
  Administered 2022-11-28 – 2022-11-29 (×2): 81 mg via ORAL
  Filled 2022-11-27 (×2): qty 1

## 2022-11-27 MED ORDER — OSELTAMIVIR PHOSPHATE 75 MG PO CAPS: 75.0000 mg | ORAL_CAPSULE | Freq: Every day | ORAL | Status: DC

## 2022-11-27 MED ORDER — PERFLUTREN LIPID MICROSPHERE
1.0000 mL | INTRAVENOUS | Status: AC | PRN
  Administered 2022-11-27: 4 mL via INTRAVENOUS

## 2022-11-27 MED ORDER — OSELTAMIVIR PHOSPHATE 75 MG PO CAPS
75.0000 mg | ORAL_CAPSULE | Freq: Once | ORAL | Status: AC
  Administered 2022-11-27: 75 mg via ORAL
  Filled 2022-11-27: qty 1

## 2022-11-27 NOTE — Consult Note (Addendum)
Cardiology Consultation   Patient ID: Timothy [name] MRN: [id number]; DOB: 18-Jul-1941  Admit date: 11/26/2022 Date of Consult: 11/27/2022  PCP:  Leonard Downing, MD   [facility] Prov[facility] Cardiologist:  Lauree Chandler, MD   {  Patient Profile:   Timothy [name] is a 81 y.o. male with a hx of HTN, type 2 DM, HLD, CKD III, CAD s/p bare-metal stent in the RCA in 2001 and Cypher DES in the circumflex in 2003, provoked PE 07/2013 off coumadin, diverticular bleeding s/p right hemicolectomy 07/2013, who is being seen 11/27/2022 for the evaluation of A fib at the request of Dr Broadus Omere.  History of Present Illness:   Timothy [name] with above PMH presented to the ER 11/26/2022 with complaints of shortness of breath. He reports 7-day duration of progressive worsening of shortness of breath and cough.  He recalls sick contact exposure from his friends.  He also reported some lower extremity edema that seems worse on the right.He states his SOB is associated with cough, also episode of chest tightness. He noted cough has much improved today, no chest pain, heart palpitation, dizziness, and syncope. He had no hx of A fib in the past.   Patient follows Dr Angelena Form outpatient, suffers CAD and underwent bare-metal stent placement in the RCA in 2001 and Cypher drug-eluting stent placement in the circumflex artery in 2003 in the setting of NSTEMI. Stress myoview February 2011 with LVEF of 55%. No ischemia (per cardiology note review).  Last seen 01/16/22, doing well without angina, continued on Plavix, statin, and metoprolol.   Admission diagnostic revealed elevated glucose 185, BUN 26, creatinine 1.7, EGFR 40.  CBC with hemoglobin 11.8.  BNP 303.  High sensitive troponin 74>87.  Flu a positive.  D-dimer elevated 0.74. CTA chest revealed negative PE, patchy tree-in-bud nodularity in the posterior right upper lobe, right middle lobe, posterior left upper lobe compatible with  infectious/inflammatory etiology such as atypical infection. Doppler showed negative for RLE DVT. EKG from 11/26/22 showed atrial flutter with ventricular rate of 81 with variable AV block.  Patient is admitted to hospital medicine, started on Eliquis for anticoagulation.  Cardiology is consulted to further manage atrial flutter today.    Past Medical History:  Diagnosis Date   Acute lower GI bleeding 07/01/2013   Bilateral pulmonary embolism ([place]) 07/20/2013   CAD (coronary artery disease)    Coronary artery disease    NSTEMI 2001, 2003    Diabetes mellitus    Hemorrhagic shock ([place]) 07/05/2013   History of MI (myocardial infarction)    Hyperlipidemia    Hypertension    Inguinal hernia    Respiratory failure, post-operative ([place]) 07/07/2013   Syncope, carotid sinus     Past Surgical History:  Procedure Laterality Date   APPENDECTOMY     CARDIAC CATHETERIZATION  00,03   stents both times   COLON SURGERY     COLONOSCOPY     COLONOSCOPY N/A 07/06/2013   Procedure: COLONOSCOPY;  Surgeon: Winfield Cunas., MD;  Location: [facility] ENDOSCOPY;  Service: Endoscopy;  Laterality: N/A;  Tattoo,clips,bicap available   ESOPHAGOGASTRODUODENOSCOPY N/A 07/06/2013   Procedure: ESOPHAGOGASTRODUODENOSCOPY (EGD);  Surgeon: Winfield Cunas., MD;  Location: [facility] ENDOSCOPY;  Service: Endoscopy;  Laterality: N/A;  at bedside   EYE SURGERY  2013   Cataracts-both   INGUINAL HERNIA REPAIR Right 06/29/2013   Procedure: HERNIA REPAIR INGUINAL ADULT;  Surgeon: Earnstine Regal, MD;  Location: [place];  Service: General;  Laterality: Right;   INSERTION OF MESH Right 06/29/2013   Procedure: INSERTION OF MESH;  Surgeon: Earnstine Regal, MD;  Location: [place];  Service: General;  Laterality: Right;   LAPAROTOMY Right 07/06/2013   Procedure: EXPLORATORY LAPAROTOMY;  Surgeon: Harl Bowie, MD;  Location: [place];  Service: General;  Laterality: Right;   None     ROTATOR CUFF REPAIR   2013   lt     Home Medications:  Prior to Admission medications   Medication Sig Start Date End Date Taking? Authorizing Provider  amLODipine (NORVASC) 5 MG tablet Take 1 tablet (5 mg total) by mouth daily. 08/12/22  Yes Burnell Blanks, MD  clopidogrel (PLAVIX) 75 MG tablet TAKE 1 TABLET BY MOUTH  DAILY 06/17/22  Yes Burnell Blanks, MD  insulin NPH-regular Human (NOVOLIN 70/30) (70-30) 100 UNIT/ML injection Inject into the skin.   Yes [provider]  loratadine (CLARITIN) 10 MG tablet Take 10 mg by mouth daily as needed for allergies.   Yes [provider]  metFORMIN (GLUCOPHAGE) 1000 MG tablet Take 1,000 mg by mouth 2 (two) times daily with a meal.   Yes [provider]  metoprolol tartrate (LOPRESSOR) 25 MG tablet TAKE 1 TABLET BY MOUTH  TWICE DAILY 06/17/22  Yes Burnell Blanks, MD  pravastatin (PRAVACHOL) 40 MG tablet TAKE 1 TABLET BY MOUTH IN  THE EVENING 06/17/22  Yes Burnell Blanks, MD  traMADol Veatrice Bourbon) 50 MG tablet Take by mouth. 07/02/22  Yes [provider]  gentamicin cream (GARAMYCIN) 0.1 % Apply to left foot ulcer once daily. Patient not taking: Reported on 11/26/2022 10/05/22   Marzetta Board, DPM  ONETOUCH ULTRA test strip 1 each daily. 08/05/20   [provider]  [place] X 15/64" 1 ML MISC USE 1 TWICE DAILY TO INJECT A MAX OF 85 UNITS LABS IN APRIL 06/29/20   [provider]    Inpatient Medications: Scheduled Meds:  amLODipine  5 mg Oral Daily   apixaban  5 mg Oral BID   [START ON 11/28/2022] aspirin  81 mg Oral Daily   furosemide  40 mg Intravenous Q12H   insulin aspart  0-15 Units Subcutaneous TID WC   insulin glargine-yfgn  10 Units Subcutaneous QHS   metoprolol tartrate  25 mg Oral BID   [START ON 11/28/2022] oseltamivir  30 mg Oral BID   pravastatin  40 mg Oral QPM   Continuous Infusions:  PRN Meds: acetaminophen **OR** acetaminophen, albuterol,  guaiFENesin-dextromethorphan, melatonin, ondansetron **OR** ondansetron (ZOFRAN) IV  Allergies:   No Known Allergies  Social History:   Social History   Socioeconomic History   Marital status: Married    Spouse name: Not on file   Number of children: Not on file   Years of education: Not on file   Highest education level: Not on file  Occupational History   Not on file  Tobacco Use   Smoking status: Never   Smokeless tobacco: Never  Substance and Sexual Activity   Alcohol use: No   Drug use: No   Sexual activity: Not on file  Other Topics Concern   Not on file  Social History Narrative   Not on file   Social Determinants of Health   Financial Resource Strain: Not on file  Food Insecurity: No Food Insecurity (11/26/2022)   Hunger Vital Sign    Worried About Running Out of Food in the Last Year: Never true  Ran Out of Food in the Last Year: Never true  Transportation Needs: No Transportation Needs (11/26/2022)   PRAPARE - Hydrologist (Medical): No    Lack of Transportation (Non-Medical): No  Physical Activity: Not on file  Stress: Not on file  Social Connections: Not on file  Intimate Partner Violence: Not At Risk (11/26/2022)   Humiliation, Afraid, Rape, and Kick questionnaire    Fear of Current or Ex-Partner: No    Emotionally Abused: No    Physically Abused: No    Sexually Abused: No    Family History:    Family History  Problem Relation Age of Onset   Cancer Brother        colon cancer     ROS:  Constitutional: Denied fever, chills, malaise, night sweats Eyes: Denied vision change or loss Ears/Nose/Mouth/Throat: Denied ear ache, sore throat, coughing, sinus pain Cardiovascular: see HPI  Respiratory: see HPI Gastrointestinal: Denied nausea, vomiting, abdominal pain, diarrhea Genital/Urinary: Denied dysuria, hematuria, urinary frequency/urgency Musculoskeletal: Denied muscle ache, joint pain, weakness Skin: Denied rash,  wound Neuro: Denied headache, dizziness, syncope Psych: Denied history of depression/anxiety  Endocrine: history of diabetes   Physical Exam/Data:   Vitals:   11/27/22 0541 11/27/22 0731 11/27/22 0803 11/27/22 1225  BP: (!) 143/74 (!) 139/111  108/61  Pulse: 72 (!) 123 92 100  Resp: _0 (!) 22  Temp: 97.6 F (36.4 C) 97.7 F (36.5 C)  98.1 F (36.7 C)  TempSrc: Oral Oral  Oral  SpO2: 99% 95%  96%  Weight:      Height:        Intake/Output Summary (Last 24 hours) at 11/27/2022 1510 Last data filed at 11/27/2022 1227 Gross per 24 hour  Intake 240 ml  Output 2350 ml  Net -2110 ml      11/26/2022    9:02 AM 01/16/2022   11:12 AM 01/21/2021    2:25 PM  Last 3 Weights  Weight (lbs) 250 lb 251 lb 9.6 oz 260 lb  Weight (kg) 113.399 kg 114.125 kg 117.935 kg     Body mass index is 32.1 kg/m.   Vitals:  Vitals:   11/27/22 0803 11/27/22 1225  BP:  108/61  Pulse: 92 100  Resp: 17 (!) 22  Temp:  98.1 F (36.7 C)  SpO2:  96%   General Appearance: In no apparent distress, sitting in chair  HEENT: Normocephalic, atraumatic.  Neck: Supple, trachea midline, no JVDs Cardiovascular: Irregularly irregular,  normal S1-S2,  no murmur/rub/gallop Respiratory: Resting breathing unlabored, lungs sounds clear to auscultation bilaterally, no use of accessory muscles. On room air.  No wheezes, rales or rhonchi.   Gastrointestinal: Bowel sounds positive, abdomen soft, hernia reducible  Extremities: Able to move all extremities in bed without difficulty, no edema of BLE Musculoskeletal: Normal muscle bulk and tone Skin: Intact, warm, dry. No rashes or petechiae noted in exposed areas.  Neurologic: Alert, oriented to person, place and time. Fluent speech, no cognitive deficit, no gross focal neuro deficit Psychiatric: Normal affect. Mood is appropriate.        EKG:  The EKG was personally reviewed and demonstrates:    EKG from 11/26/22 showed A flutter with ventricular rate of  81 with various AVB  Telemetry:  Telemetry was personally reviewed and demonstrates:    A flutter with variable AVB versus coarse A fib, rate controlled   Relevant CV Studies:  [place] from 11/27/2022:   1. Left ventricular ejection fraction,  by estimation, is 55 to 60%. The  left ventricle has normal function. The left ventricle has no regional  wall motion abnormalities. There is mild concentric left ventricular  hypertrophy. Left ventricular diastolic  parameters are indeterminate.   2. Right ventricular systolic function is normal. The right ventricular  size is normal. Tricuspid regurgitation signal is inadequate for assessing  PA pressure.   3. The mitral valve is grossly normal. No evidence of mitral valve  regurgitation. No evidence of mitral stenosis.   4. The aortic valve is grossly normal. Aortic valve regurgitation is not  visualized. No aortic stenosis is present.   5. Aortic dilatation noted. 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   6. The inferior vena cava is normal in size with greater than 50%  respiratory variability, suggesting right atrial pressure of 3 mmHg.   Comparison(s): No prior Echocardiogram.    Laboratory Data:  High Sensitivity Troponin:   Recent Labs  Lab 11/26/22 0915 11/26/22 1115 11/27/22 0102  TROPONINIHS 74* 87* 77*     Chemistry Recent Labs  Lab 11/26/22 0915 11/27/22 0109  NA 140 140  K 4.7 4.2  CL 103 106  CO2 28 28  GLUCOSE 185* 142*  BUN 26* 19  CREATININE 1.70* 1.61*  CALCIUM 8.6* 8.6*  MG  --  2.2  GFRNONAA 40* 42*  ANIONGAP 9 6    Recent Labs  Lab 11/26/22 0915 11/27/22 0109  PROT 6.7 6.7  ALBUMIN 4.0 3.4*  AST 11* 12*  ALT 10 14  ALKPHOS 59 57  BILITOT 0.8 1.0   Lipids  Recent Labs  Lab 11/27/22 0109  CHOL 109  TRIG 77  HDL 29*  LDLCALC 65  CHOLHDL 3.8    Hematology Recent Labs  Lab 11/26/22 0915  11/27/22 0109  WBC 6.0 7.7  RBC 3.79* 3.93*  HGB 11.8* 12.3*  HCT 36.5* 37.2*  MCV 96.3 94.7  MCH 31.1 31.3  MCHC 32.3 33.1  RDW 13.5 13.3  PLT 206 220   Thyroid  Recent Labs  Lab 11/27/22 0109  TSH 3.750    BNP Recent Labs  Lab 11/26/22 0915  BNP 303.1*    DDimer  Recent Labs  Lab 11/26/22 0915  DDIMER 0.74*     Radiology/Studies:  ECHOCARDIOGRAM COMPLETE  Result Date: 11/27/2022    ECHOCARDIOGRAM REPORT   Patient Name:   Timothy [name] [facility] Date of Exam: 11/27/2022 Medical Rec #:  [id number]       Height:       74.0 in Accession #:    [id number]      Weight:       250.0 lb Date of Birth:  [birth year]       BSA:          2.390 m Patient Age:    81 years        BP:           139/111 mmHg Patient Gender: M               HR:           111 bpm. Exam Location:  Inpatient Procedure: 2D Echo, Cardiac Doppler, Color Doppler and Intracardiac            Opacification Agent Indications:    Atrial Fibrillation I48.91  History:        Patient has no prior history of Echocardiogram  examinations.                 CAD; Risk Factors:Hypertension, Diabetes and Dyslipidemia.  Sonographer:    Darlina Sicilian RDCS Referring Phys: [phone] ERIC CHEN  Sonographer Comments: Suboptimal parasternal window, suboptimal apical window and suboptimal subcostal window. IMPRESSIONS  1. Left ventricular ejection fraction, by estimation, is 55 to 60%. The left ventricle has normal function. The left ventricle has no regional wall motion abnormalities. There is mild concentric left ventricular hypertrophy. Left ventricular diastolic parameters are indeterminate.  2. Right ventricular systolic function is normal. The right ventricular size is normal. Tricuspid regurgitation signal is inadequate for assessing PA pressure.  3. The mitral valve is grossly normal. No evidence of mitral valve regurgitation. No evidence of mitral stenosis.  4. The aortic valve is grossly normal. Aortic valve regurgitation is not visualized. No aortic  stenosis is present.  5. Aortic dilatation noted. There is mild dilatation of the aortic root, measuring 40 mm. There is mild dilatation of the ascending aorta, measuring 38 mm.  6. The inferior vena cava is normal in size with greater than 50% respiratory variability, suggesting right atrial pressure of 3 mmHg. Comparison(s): No prior Echocardiogram. FINDINGS  Left Ventricle: Left ventricular ejection fraction, by estimation, is 55 to 60%. The left ventricle has normal function. The left ventricle has no regional wall motion abnormalities. Definity contrast agent was given IV to delineate the left ventricular  endocardial borders. The left ventricular internal cavity size was normal in size. There is mild concentric left ventricular hypertrophy. Left ventricular diastolic parameters are indeterminate. Right Ventricle: The right ventricular size is normal. No increase in right ventricular wall thickness. Right ventricular systolic function is normal. Tricuspid regurgitation signal is inadequate for assessing PA pressure. Left Atrium: Left atrial size was normal in size. Right Atrium: Right atrial size was normal in size. Pericardium: There is no evidence of pericardial effusion. Mitral Valve: The mitral valve is grossly normal. No evidence of mitral valve regurgitation. No evidence of mitral valve stenosis. Tricuspid Valve: The tricuspid valve is grossly normal. Tricuspid valve regurgitation is not demonstrated. No evidence of tricuspid stenosis. Aortic Valve: The aortic valve is grossly normal. Aortic valve regurgitation is not visualized. No aortic stenosis is present. Pulmonic Valve: The pulmonic valve was grossly normal. Pulmonic valve regurgitation is not visualized. No evidence of pulmonic stenosis. Aorta: Aortic dilatation noted. There is mild dilatation of the aortic root, measuring 40 mm. There is mild dilatation of the ascending aorta, measuring 38 mm. Venous: The inferior vena cava is normal in size with  greater than 50% respiratory variability, suggesting right atrial pressure of 3 mmHg. IAS/Shunts: No atrial level shunt detected by color flow Doppler.  LEFT VENTRICLE PLAX 2D LVIDd:         4.50 cm   Diastology LVIDs:         3.10 cm   LV e' medial:    8.43 cm/s LV PW:         1.20 cm   LV E/e' medial:  8.5 LV IVS:        1.20 cm   LV e' lateral:   8.89 cm/s LVOT diam:     2.40 cm   LV E/e' lateral: 8.0 LV SV:         60 LV SV Index:   25 LVOT Area:     4.52 cm  RIGHT VENTRICLE RV S prime:     10.70 cm/s LEFT ATRIUM  Index LA diam:        2.70 cm 1.13 cm/m LA Vol (A2C):   38.8 ml 16.24 ml/m LA Vol (A4C):   47.2 ml 19.75 ml/m LA Biplane Vol: 44.6 ml 18.66 ml/m  AORTIC VALVE LVOT Vmax:   61.70 cm/s LVOT Vmean:  45.250 cm/s LVOT VTI:    0.133 m  AORTA Ao Root diam: 4.00 cm Ao STJ diam:  3.1 cm Ao Asc diam:  3.80 cm MITRAL VALVE MV Area (PHT): 4.05 cm    SHUNTS MV Decel Time: 187 msec    Systemic VTI:  0.13 m MV E velocity: 71.43 cm/s  Systemic Diam: 2.40 cm Phineas Inches Electronically signed by Phineas Inches Signature Date/Time: 11/27/2022/9:53:09 AM    Final    CT Angio Chest PE W/Cm &/Or Wo Cm  Result Date: 11/26/2022 CLINICAL DATA:  Shortness of breath for 1 week. Cough. Clinical concern for pulmonary embolus. EXAM: CT ANGIOGRAPHY CHEST WITH CONTRAST TECHNIQUE: Multidetector CT imaging of the chest was performed using the standard protocol during bolus administration of intravenous contrast. Multiplanar CT image reconstructions and MIPs were obtained to evaluate the vascular anatomy. RADIATION DOSE REDUCTION: This exam was performed according to the departmental dose-optimization program which includes automated exposure control, adjustment of the mA and/or kV according to patient size and/or use of iterative reconstruction technique. CONTRAST:  19m OMNIPAQUE IOHEXOL 350 MG/ML SOLN COMPARISON:  07/19/2013 FINDINGS: Cardiovascular: The heart size is normal. No substantial pericardial effusion.  Coronary artery calcification is evident. Mild atherosclerotic calcification is noted in the wall of the thoracic aorta. There is no filling defect within the opacified pulmonary arteries to suggest the presence of an acute pulmonary embolus. Mediastinum/Nodes: No mediastinal lymphadenopathy. There is no hilar lymphadenopathy. The esophagus has normal imaging features. There is no axillary lymphadenopathy. Lungs/Pleura: Patchy tree-in-bud nodularity seen posterior right upper lobe , right middle lobe and posterior left upper lobe. Subsegmental atelectasis noted both lower lobes. No dense focal airspace consolidation. No pleural effusion. Upper Abdomen: Tiny layering calcified gallstones evident. The liver shows diffusely decreased attenuation suggesting fat deposition. Musculoskeletal: No worrisome lytic or sclerotic osseous abnormality. Review of the MIP images confirms the above findings. IMPRESSION: 1. No CT evidence for acute pulmonary embolus. 2. Patchy tree-in-bud nodularity in the posterior right upper lobe, right middle lobe, and posterior left upper lobe. Imaging features compatible with infectious/inflammatory etiology with atypical infection a distinct consideration. 3. Cholelithiasis. 4. Hepatic steatosis. 5.  Aortic Atherosclerosis (ICD10-I70.0). Electronically Signed   By: EMisty StanleyM.D.   On: 11/26/2022 12:05   UKoreaVenous Img Lower Right (DVT Study)  Result Date: 11/26/2022 CLINICAL DATA:  Short of breath, right lower extremity pain EXAM: RIGHT LOWER EXTREMITY VENOUS DOPPLER ULTRASOUND TECHNIQUE: Gray-scale sonography with compression, as well as color and duplex ultrasound, were performed to evaluate the deep venous system(s) from the level of the common femoral vein through the popliteal and proximal calf veins. COMPARISON:  None Available. FINDINGS: VENOUS Normal compressibility of the common femoral, superficial femoral, and popliteal veins, as well as the visualized calf veins. Visualized  portions of profunda femoral vein and great saphenous vein unremarkable. No filling defects to suggest DVT on grayscale or color Doppler imaging. Doppler waveforms show normal direction of venous flow, normal respiratory plasticity and response to augmentation. Limited views of the contralateral common femoral vein are unremarkable. OTHER None. Limitations: none IMPRESSION: Negative. Electronically Signed   By: HJacqulynn CadetM.D.   On: 11/26/2022 10:24   DG Chest 2  View  Result Date: 11/26/2022 CLINICAL DATA:  Provided history: Shortness of breath, right leg swelling. Productive cough. Sick contacts. EXAM: CHEST - 2 VIEW COMPARISON:  Prior chest radiographs 07/06/2013 and earlier. Chest CT 07/19/2013. FINDINGS: Shallow inspiration radiograph. Heart size within normal limits. Aortic atherosclerosis. Small linear opacities within the left lung base, which may reflect atelectasis or scarring. No appreciable airspace consolidation. No evidence of pleural effusion or pneumothorax. Degenerative changes of the spine. IMPRESSION: 1. Shallow inspiration radiograph. 2. Small linear opacities within the left lung base, which may reflect atelectasis or scarring. 3. Otherwise, no evidence of acute cardiopulmonary abnormality. 4. Aortic Atherosclerosis (ICD10-I70.0). Electronically Signed   By: Kellie Simmering D.O.   On: 11/26/2022 10:05     Assessment and Plan:   Atrial flutter with variable AVB, new diagnosis  - presented with respiratory symptoms in the setting of Flu A, unknown onset, appears new  - TSH WNL - Echo with LVEF preserved, no significant findings  - EKG /telemetry showed atrial flutter with variable AVB, rate controlled  - continue PTA metoprolol 88m BID  - agree with Eliquis  520mBID, continue for 3 weeks before DCCV outpatient if indicated  - EP referral for ablation, will arrange appt with A fib clinic and EP   CAD with PCIs 2001 and 2003 - Hs trop flat, not consistent with ACS  - no  angina - continue medical therapy: agree with switching Plavix to ASA given the need of therapeutic anticoagulation now; continue metoprolol and pravastatin   Flu A HLD HTN Type 2 DM  CKD IIIB Anemia  - per primary team      Risk Assessment/Risk Scores:    CHA2DS2-VASc Score = 5  This indicates a 7.2% annual risk of stroke. The patient's score is based upon: CHF History: 0 HTN History: 1 Diabetes History: 1 Stroke History: 0 Vascular Disease History: 1 Age Score: 2 Gender Score: 0    For questions or updates, please contact [place] consult [email or web address] for contact info under    Signed, XiMargie BilletNP  11/27/2022 3:10 PM  I have examined the patient and reviewed assessment and plan and discussed with patient.  Agree with above as stated.  I suspect atrial flutter triggered by his flu illness.  He is rate controlled.  He is not having palpitations.  Anticoagulation started.  Hopefully, atrial flutter will resolve once his fluid resolves.  If it persist, could consider cardioversion after 3 weeks or even ablation.  Will have him follow-up after the holidays to make further plans.  JaLarae Grooms

## 2022-11-27 NOTE — Discharge Instructions (Signed)

## 2022-11-27 NOTE — Progress Notes (Signed)
Mobility Specialist Progress Note:   11/27/22 1016  Mobility  Activity Ambulated with assistance in hallway  Level of Assistance Standby assist, set-up cues, supervision of patient - no hands on  Assistive Device None  Distance Ambulated (ft) 450 ft  Activity Response Tolerated well  Mobility Referral Yes  $Mobility charge 1 Mobility   Pt received in chair willing to participate in mobility. No complaints of pain. Left EOB with call bell in reach and all needs met.   Timothy [name] Mobility Specialist Please contact via [employer] or  Rehab Office at [phone]

## 2022-11-27 NOTE — Progress Notes (Signed)
PROGRESS NOTE    Timothy [name]  ZJQ:[id number] DOB: 09-24-1941 DOA: 11/26/2022 PCP: [name], MD   81/M w type 2 diabetes on insulin, essential hypertension, coronary disease status post stent, hyperlipidemia presented to the ER with a 7-day history of shortness of breath, cough..  He has had some lower extreme edema.  He noted some edema in his right leg. In ED, noted to be in Afib,  positive influenza A negative COVID, creatinine 1.7 White count of 6,,  CTPA  negative for PE.with multifocal PNA, Right lower extremity ultrasound was negative for DVT. His EKG did show rate controlled A , BNP- 303.  Subjective: Some cough, shortness of breath   Assessment and Plan:  Atrial fibrillation (HCC) -Continue Lopressor, heart rate controlled -start Eliquis 5 mg bid.  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 Start asa 81 mg daily(to reduce risk of GI bleeding while on Eliquis).  -Continue lopressor, pravastatin  Hyperlipidemia Continue pravastatin 40 mg qhs.  Essential hypertension Stable. Continue lopressor 25 mg bid, norvasc 5 mg daily.  Normocytic anemia Stable. Send iron profile.  Diabetes (HCC) Stable add SSI. Hold metformin. Add lantus. Check A1c.  Stage 3b chronic kidney disease (CKD) (HCC) - baseline SCr 1.7-1.9 Stable.  DVT prophylaxis: apixaban Code Status: full code Family Communication: Wife at bedside Disposition Plan: Home likely 1 to 2 days  Consultants: Cardiology   Procedures:    Antimicrobials:    Objective: Vitals:   11/26/22 1830 11/26/22 2130 11/26/22 2244 11/27/22 0048  BP: 123/69 (!) 154/68 (!) 157/82 133/77  Pulse: (!) 44 99 92 94  Resp: 10 16 14 10   Temp:  98.2 F (36.8 C) 98.2 F (36.8 C) 97.8 F (36.6 C)  TempSrc:  Oral Oral Oral  SpO2: 94% 100% 97% 99%  Weight:      Height:        Intake/Output Summary (Last 24 hours) at 11/27/2022 0547 Last data filed at 11/27/2022 0445 Gross per 24 hour  Intake --  Output 600 ml  Net -600 ml   Filed Weights   11/26/22 0902  Weight: 113.4 kg    Examination:  Pleasant male sitting up in bed, AAOx3, no distress HEENT: Positive JVD CVS: S1-S2, regular rhythm Lungs: Poor air movement bilaterally Extremities: 1+ edema worse on the right    Data Reviewed:   CBC: Recent Labs  Lab 11/26/22 0915 11/27/22 0109  WBC 6.0 7.7  NEUTROABS 3.9 5.1  HGB 11.8* 12.3*  HCT 36.5* 37.2*  MCV 96.3 94.7  PLT 206 220   Basic Metabolic Panel: Recent Labs  Lab 11/26/22 0915 11/27/22 0109  NA 140 140  K 4.7 4.2  CL 103 106  CO2 28 28  GLUCOSE 185* 142*  BUN 26* 19  CREATININE 1.70* 1.61*  CALCIUM 8.6* 8.6*  MG  --  2.2   GFR: Estimated Creatinine Clearance: 46.6 mL/min (A) (by C-G formula based on SCr of 1.61 mg/dL (H)). Liver Function Tests: Recent Labs  Lab 11/26/22 0915 11/27/22 0109  AST 11* 12*  ALT 10  14  ALKPHOS 59 57  BILITOT 0.8 1.0  PROT 6.7 6.7  ALBUMIN 4.0 3.4*   No results for input(s): "LIPASE", "AMYLASE" in the last 168 hours. No results for input(s): "AMMONIA" in the last 168 hours. Coagulation Profile: No results for input(s): "INR", "PROTIME" in the last 168 hours. Cardiac Enzymes: No results for input(s): "CKTOTAL", "CKMB", "CKMBINDEX", "TROPONINI" in the last 168 hours. BNP (last 3 results) No results for input(s): "PROBNP" in the last 8760 hours. HbA1C: No results for input(s): "HGBA1C" in the last 72 hours. CBG: Recent Labs  Lab 11/26/22 1207  11/26/22 1718 11/26/22 2203  GLUCAP 72 91 117*   Lipid Profile: Recent Labs    11/27/22 0109  CHOL 109  HDL 29*  LDLCALC 65  TRIG 77  CHOLHDL 3.8   Thyroid Function Tests: Recent Labs    11/27/22 0109  TSH 3.750   Anemia Panel: Recent Labs    11/27/22 0109  TIBC 333  IRON 56   Urine analysis:    Component Value Date/Time   COLORURINE YELLOW 06/10/2018 1937   APPEARANCEUR CLEAR 06/10/2018 1937   LABSPEC 1.024 06/10/2018 1937   PHURINE 5.0 06/10/2018 1937   GLUCOSEU 50 (A) 06/10/2018 1937   HGBUR LARGE (A) 06/10/2018 1937   BILIRUBINUR NEGATIVE 06/10/2018 1937   KETONESUR 20 (A) 06/10/2018 1937   PROTEINUR 30 (A) 06/10/2018 1937   UROBILINOGEN 1.0 07/19/2013 2010   NITRITE NEGATIVE 06/10/2018 1937   LEUKOCYTESUR NEGATIVE 06/10/2018 1937   Sepsis Labs: @LABRCNTIP (procalcitonin:4,lacticidven:4)  ) Recent Results (from the past 240 hour(s))  Resp panel by RT-PCR (RSV, Flu A&B, Covid) Anterior Nasal Swab     Status: Abnormal   Collection Time: 11/26/22  9:15 AM   Specimen: Anterior Nasal Swab  Result Value Ref Range Status   SARS Coronavirus 2 by RT PCR NEGATIVE NEGATIVE Final    Comment: (NOTE) SARS-CoV-2 target nucleic acids are NOT DETECTED.  The SARS-CoV-2 RNA is generally detectable in upper respiratory specimens during the acute phase of infection. The lowest concentration of SARS-CoV-2 viral copies this assay can detect is 138 copies/mL. A negative result does not preclude SARS-Cov-2 infection and should not be used as the sole basis for treatment or other patient management decisions. A negative result may occur with  improper specimen collection/handling, submission of specimen other than nasopharyngeal swab, presence of viral mutation(s) within the areas targeted by this assay, and inadequate number of viral copies(<138 copies/mL). A negative result must be combined with clinical observations, patient history, and epidemiological information.  The expected result is Negative.  Fact Sheet for Patients:  EntrepreneurPulse.com.au  Fact Sheet for Healthcare Providers:  IncredibleEmployment.be  This test is no t yet approved or cleared by the [place] FDA and  has been authorized for detection and/or diagnosis of SARS-CoV-2 by FDA under an Emergency Use Authorization (EUA). This EUA will remain  in effect (meaning this test can be used) for the duration of the COVID-19 declaration under Section 564(b)(1) of the Act, 21 U.S.C.section 360bbb-3(b)(1), unless the authorization is terminated  or revoked sooner.       Influenza A by PCR POSITIVE (A) NEGATIVE Final   Influenza B by PCR NEGATIVE NEGATIVE Final    Comment: (NOTE) The Xpert Xpress SARS-CoV-2/FLU/RSV plus assay is intended as an aid in the diagnosis of influenza from Nasopharyngeal swab specimens and should not be used as a sole basis for treatment. Nasal washings and aspirates are unacceptable for Xpert Xpress SARS-CoV-2/FLU/RSV testing.  Fact Sheet for  Patients: EntrepreneurPulse.com.au  Fact Sheet for Healthcare Providers: IncredibleEmployment.be  This test is not yet approved or cleared by the [place] FDA and has been authorized for detection and/or diagnosis of SARS-CoV-2 by FDA under an Emergency Use Authorization (EUA). This EUA will remain in effect (meaning this test can be used) for the duration of the COVID-19 declaration under Section 564(b)(1) of the Act, 21 U.S.C. section 360bbb-3(b)(1), unless the authorization is terminated or revoked.     Resp Syncytial Virus by PCR NEGATIVE NEGATIVE Final    Comment: (NOTE) Fact Sheet for Patients: EntrepreneurPulse.com.au  Fact Sheet for Healthcare Providers: IncredibleEmployment.be  This test is not yet approved or cleared by the [place] FDA and has been authorized for detection  and/or diagnosis of SARS-CoV-2 by FDA under an Emergency Use Authorization (EUA). This EUA will remain in effect (meaning this test can be used) for the duration of the COVID-19 declaration under Section 564(b)(1) of the Act, 21 U.S.C. section 360bbb-3(b)(1), unless the authorization is terminated or revoked.  Performed at [facility], [address] [place], [place] [address]      Radiology Studies: CT Angio Chest PE W/Cm &/Or Wo Cm  Result Date: 11/26/2022 CLINICAL DATA:  Shortness of breath for 1 week. Cough. Clinical concern for pulmonary embolus. EXAM: CT ANGIOGRAPHY CHEST WITH CONTRAST TECHNIQUE: Multidetector CT imaging of the chest was performed using the standard protocol during bolus administration of intravenous contrast. Multiplanar CT image reconstructions and MIPs were obtained to evaluate the vascular anatomy. RADIATION DOSE REDUCTION: This exam was performed according to the departmental dose-optimization program which includes automated exposure control, adjustment of the mA and/or kV according to patient size and/or use of iterative reconstruction technique. CONTRAST:  72mL OMNIPAQUE IOHEXOL 350 MG/ML SOLN COMPARISON:  07/19/2013 FINDINGS: Cardiovascular: The heart size is normal. No substantial pericardial effusion. Coronary artery calcification is evident. Mild atherosclerotic calcification is noted in the wall of the thoracic aorta. There is no filling defect within the opacified pulmonary arteries to suggest the presence of an acute pulmonary embolus. Mediastinum/Nodes: No mediastinal lymphadenopathy. There is no hilar lymphadenopathy. The esophagus has normal imaging features. There is no axillary lymphadenopathy. Lungs/Pleura: Patchy tree-in-bud nodularity seen posterior right upper lobe , right middle lobe and posterior left upper lobe. Subsegmental atelectasis noted both lower lobes. No dense focal airspace consolidation. No pleural effusion. Upper  Abdomen: Tiny layering calcified gallstones evident. The liver shows diffusely decreased attenuation suggesting fat deposition. Musculoskeletal: No worrisome lytic or sclerotic osseous abnormality. Review of the MIP images confirms the above findings. IMPRESSION: 1. No CT evidence for acute pulmonary embolus. 2. Patchy tree-in-bud nodularity in the posterior right upper lobe, right middle lobe, and posterior left upper lobe. Imaging features compatible with infectious/inflammatory etiology with atypical infection a distinct consideration. 3. Cholelithiasis. 4. Hepatic steatosis. 5.  Aortic Atherosclerosis (ICD10-I70.0). Electronically Signed   By: Misty Stanley M.D.   On: 11/26/2022 12:05   US Venous Img Lower Right (DVT Study)  Result Date: 11/26/2022 CLINICAL DATA:  Short of breath, right lower extremity pain EXAM: RIGHT LOWER EXTREMITY VENOUS DOPPLER ULTRASOUND TECHNIQUE: Gray-scale sonography with compression, as well as color and duplex ultrasound, were performed to evaluate the deep venous system(s) from the level of the common femoral vein through the popliteal and proximal calf veins. COMPARISON:  None Available. FINDINGS: VENOUS Normal compressibility of the common femoral, superficial femoral, and popliteal veins, as well as the visualized calf veins. Visualized portions of profunda femoral vein and great saphenous vein  unremarkable. No filling defects to suggest DVT on grayscale or color Doppler imaging. Doppler waveforms show normal direction of venous flow, normal respiratory plasticity and response to augmentation. Limited views of the contralateral common femoral vein are unremarkable. OTHER None. Limitations: none IMPRESSION: Negative. Electronically Signed   By: Jacqulynn Cadet M.D.   On: 11/26/2022 10:24   DG Chest 2 View  Result Date: 11/26/2022 CLINICAL DATA:  Provided history: Shortness of breath, right leg swelling. Productive cough. Sick contacts. EXAM: CHEST - 2 VIEW COMPARISON:   Prior chest radiographs 07/06/2013 and earlier. Chest CT 07/19/2013. FINDINGS: Shallow inspiration radiograph. Heart size within normal limits. Aortic atherosclerosis. Small linear opacities within the left lung base, which may reflect atelectasis or scarring. No appreciable airspace consolidation. No evidence of pleural effusion or pneumothorax. Degenerative changes of the spine. IMPRESSION: 1. Shallow inspiration radiograph. 2. Small linear opacities within the left lung base, which may reflect atelectasis or scarring. 3. Otherwise, no evidence of acute cardiopulmonary abnormality. 4. Aortic Atherosclerosis (ICD10-I70.0). Electronically Signed   By: Kellie Simmering D.O.   On: 11/26/2022 10:05     Scheduled Meds:  amLODipine  5 mg Oral Daily   apixaban  5 mg Oral BID   aspirin EC  81 mg Oral Daily   insulin aspart  0-15 Units Subcutaneous TID WC   insulin glargine-yfgn  10 Units Subcutaneous QHS   metoprolol tartrate  25 mg Oral BID   pravastatin  40 mg Oral QPM   Continuous Infusions:   LOS: 0 days    Time spent:75min    Domenic Polite, MD [facility]   11/27/2022, 5:47 AM

## 2022-11-27 NOTE — TOC Benefit Eligibility Note (Signed)
Patient Product/process development scientist completed.    The patient is currently admitted and upon discharge could be taking Eliquis.  The current 30 day co-pay is $47.00.      Patient Product/process development scientist completed.    The patient is currently admitted and upon discharge could be taking Xarelto.  The current 30 day co-pay is $47.00.   The patient is insured through [facility] Part D

## 2022-11-28 DIAGNOSIS — I4891 Unspecified atrial fibrillation: Secondary | ICD-10-CM | POA: Diagnosis not present

## 2022-11-28 DIAGNOSIS — I483 Typical atrial flutter: Secondary | ICD-10-CM | POA: Diagnosis not present

## 2022-11-28 LAB — GLUCOSE, CAPILLARY
Glucose-Capillary: 186 mg/dL — ABNORMAL HIGH (ref 70–99)
Glucose-Capillary: 296 mg/dL — ABNORMAL HIGH (ref 70–99)
Glucose-Capillary: 215 mg/dL — ABNORMAL HIGH (ref 70–99)
Glucose-Capillary: 218 mg/dL — ABNORMAL HIGH (ref 70–99)

## 2022-11-28 LAB — BASIC METABOLIC PANEL
Anion gap: 11 (ref 5–15)
BUN: 22 mg/dL (ref 8–23)
CO2: 24 mmol/L (ref 22–32)
Calcium: 8.4 mg/dL — ABNORMAL LOW (ref 8.9–10.3)
Chloride: 102 mmol/L (ref 98–111)
Creatinine, Ser: 1.69 mg/dL — ABNORMAL HIGH (ref 0.61–1.24)
GFR, Estimated: 40 mL/min — ABNORMAL LOW (ref >60)
Glucose, Bld: 190 mg/dL — ABNORMAL HIGH (ref 70–99)
Potassium: 4 mmol/L (ref 3.5–5.1)
Sodium: 137 mmol/L (ref 135–145)

## 2022-11-28 LAB — CBC
HCT: 37.7 % — ABNORMAL LOW (ref 39.0–52.0)
Hemoglobin: 12.2 g/dL — ABNORMAL LOW (ref 13.0–17.0)
MCH: 30.9 pg (ref 26.0–34.0)
MCHC: 32.4 g/dL (ref 30.0–36.0)
MCV: 95.4 fL (ref 80.0–100.0)
Platelets: 228 10*3/uL (ref 150–400)
RBC: 3.95 MIL/uL — ABNORMAL LOW (ref 4.22–5.81)
RDW: 13.2 % (ref 11.5–15.5)
WBC: 7.3 10*3/uL (ref 4.0–10.5)
nRBC: 0 % (ref 0.0–0.2)

## 2022-11-28 LAB — HEMOGLOBIN A1C
Hgb A1c MFr Bld: 6.7 % — ABNORMAL HIGH (ref 4.8–5.6)
Mean Plasma Glucose: 146 mg/dL

## 2022-11-28 MED ORDER — LEVALBUTEROL HCL 0.63 MG/3ML IN NEBU: 0.6300 mg | INHALATION_SOLUTION | Freq: Three times a day (TID) | RESPIRATORY_TRACT | Status: DC | PRN

## 2022-11-28 MED ORDER — METOPROLOL TARTRATE 5 MG/5ML IV SOLN: 2.5000 mg | INTRAVENOUS | Status: DC | PRN

## 2022-11-28 MED ORDER — METOPROLOL TARTRATE 25 MG PO TABS
37.5000 mg | ORAL_TABLET | Freq: Two times a day (BID) | ORAL | Status: DC
  Administered 2022-11-28: 37.5 mg via ORAL
  Filled 2022-11-28: qty 1

## 2022-11-28 MED ORDER — METOPROLOL TARTRATE 50 MG PO TABS
50.0000 mg | ORAL_TABLET | Freq: Four times a day (QID) | ORAL | Status: DC
  Administered 2022-11-28 (×2): 50 mg via ORAL
  Filled 2022-11-28 (×4): qty 1

## 2022-11-28 NOTE — Progress Notes (Addendum)
Cardiology Progress Note  Patient ID: Timothy [name] MRN: [id number] DOB: [birth year] Date of Encounter: 11/28/2022  Primary Cardiologist: Lauree Chandler, MD  Subjective   Chief Complaint: SOB  HPI: Shortness of breath improving.  Now back to baseline.  Now in atrial flutter heart rate in the 120s.  ROS:  All other ROS reviewed and negative. Pertinent positives noted in the HPI.     Inpatient Medications  Scheduled Meds:  apixaban  5 mg Oral BID   aspirin  81 mg Oral Daily   insulin aspart  0-15 Units Subcutaneous TID WC   insulin glargine-yfgn  10 Units Subcutaneous QHS   metoprolol tartrate  37.5 mg Oral BID   oseltamivir  30 mg Oral BID   pravastatin  40 mg Oral QPM   Continuous Infusions:  PRN Meds: acetaminophen **OR** acetaminophen, albuterol, guaiFENesin-dextromethorphan, melatonin, ondansetron **OR** ondansetron (ZOFRAN) IV   Vital Signs   Vitals:   11/28/22 0057 11/28/22 0420 11/28/22 0500 11/28/22 0906  BP: (!) 147/70 136/64  117/78  Pulse: 83 93  (!) 125  Resp: 18 13  11   Temp: 97.6 F (36.4 C) (!) 97.5 F (36.4 C)  98.2 F (36.8 C)  TempSrc: Oral Oral  Oral  SpO2: 93% 95%  97%  Weight:   106.5 kg   Height:        Intake/Output Summary (Last 24 hours) at 11/28/2022 1031 Last data filed at 11/28/2022 0900 Gross per 24 hour  Intake 720 ml  Output 1350 ml  Net -630 ml      11/28/2022    5:00 AM 11/26/2022    9:02 AM 01/16/2022   11:12 AM  Last 3 Weights  Weight (lbs) 234 lb 12.6 oz 250 lb 251 lb 9.6 oz  Weight (kg) 106.5 kg 113.399 kg 114.125 kg      Telemetry  Overnight telemetry shows AFL 124, which I personally reviewed.   ECG  The most recent ECG shows Aflutter 81 bpm, which I personally reviewed.   Physical Exam   Vitals:   11/28/22 0057 11/28/22 0420 11/28/22 0500 11/28/22 0906  BP: (!) 147/70 136/64  117/78  Pulse: 83 93  (!) 125  Resp: 18 13  11   Temp: 97.6 F (36.4 C) (!) 97.5 F (36.4 C)  98.2 F (36.8 C)   TempSrc: Oral Oral  Oral  SpO2: 93% 95%  97%  Weight:   106.5 kg   Height:        Intake/Output Summary (Last 24 hours) at 11/28/2022 1031 Last data filed at 11/28/2022 0900 Gross per 24 hour  Intake 720 ml  Output 1350 ml  Net -630 ml       11/28/2022    5:00 AM 11/26/2022    9:02 AM 01/16/2022   11:12 AM  Last 3 Weights  Weight (lbs) 234 lb 12.6 oz 250 lb 251 lb 9.6 oz  Weight (kg) 106.5 kg 113.399 kg 114.125 kg    Body mass index is 30.15 kg/m.  General: Well nourished, well developed, in no acute distress Head: Atraumatic, normal size  Eyes: PEERLA, EOMI  Neck: Supple, no JVD Endocrine: No thryomegaly Cardiac: Normal S1, S2; tachycardia noted Lungs: Diminished breath sounds bilaterally Abd: Soft, nontender, no hepatomegaly  Ext: No edema, pulses 2+ Musculoskeletal: No deformities, BUE and BLE strength normal and equal Skin: Warm and dry, no rashes   Neuro: Alert and oriented to person, place, time, and situation, CNII-XII grossly intact, no focal deficits  Psych: Normal mood and  affect   Labs  High Sensitivity Troponin:   Recent Labs  Lab 11/26/22 0915 11/26/22 1115 11/27/22 0102  TROPONINIHS 74* 87* 77*     Cardiac EnzymesNo results for input(s): "TROPONINI" in the last 168 hours. No results for input(s): "TROPIPOC" in the last 168 hours.  Chemistry Recent Labs  Lab 11/26/22 0915 11/27/22 0109 11/28/22 0115  NA 140 140 137  K 4.7 4.2 4.0  CL 103 106 102  CO2 28 28 24   GLUCOSE 185* 142* 190*  BUN 26* 19 22  CREATININE 1.70* 1.61* 1.69*  CALCIUM 8.6* 8.6* 8.4*  PROT 6.7 6.7  --   ALBUMIN 4.0 3.4*  --   AST 11* 12*  --   ALT 10 14  --   ALKPHOS 59 57  --   BILITOT 0.8 1.0  --   GFRNONAA 40* 42* 40*  ANIONGAP 9 6 11     Hematology Recent Labs  Lab 11/26/22 0915 11/27/22 0109 11/28/22 0115  WBC 6.0 7.7 7.3  RBC 3.79* 3.93* 3.95*  HGB 11.8* 12.3* 12.2*  HCT 36.5* 37.2* 37.7*  MCV 96.3 94.7 95.4  MCH 31.1 31.3 30.9  MCHC 32.3 33.1 32.4   RDW 13.5 13.3 13.2  PLT 206 220 228   BNP Recent Labs  Lab 11/26/22 0915  BNP 303.1*    DDimer  Recent Labs  Lab 11/26/22 0915  DDIMER 0.74*     Radiology  ECHOCARDIOGRAM COMPLETE  Result Date: 11/27/2022    ECHOCARDIOGRAM REPORT   Patient Name:   [name] [facility] Date of Exam: 11/27/2022 Medical Rec #:  [id number]       Height:       74.0 in Accession #:    [id number]      Weight:       250.0 lb Date of Birth:  1941/11/03       BSA:          2.390 m Patient Age:    81 years        BP:           139/111 mmHg Patient Gender: M               HR:           111 bpm. Exam Location:  Inpatient Procedure: 2D Echo, Cardiac Doppler, Color Doppler and Intracardiac            Opacification Agent Indications:    Atrial Fibrillation I48.91  History:        Patient has no prior history of Echocardiogram examinations.                 CAD; Risk Factors:Hypertension, Diabetes and Dyslipidemia.  Sonographer:    Darlina Sicilian RDCS Referring Phys: [phone] ERIC CHEN  Sonographer Comments: Suboptimal parasternal window, suboptimal apical window and suboptimal subcostal window. IMPRESSIONS  1. Left ventricular ejection fraction, by estimation, is 55 to 60%. The left ventricle has normal function. The left ventricle has no regional wall motion abnormalities. There is mild concentric left ventricular hypertrophy. Left ventricular diastolic parameters are indeterminate.  2. Right ventricular systolic function is normal. The right ventricular size is normal. Tricuspid regurgitation signal is inadequate for assessing PA pressure.  3. The mitral valve is grossly normal. No evidence of mitral valve regurgitation. No evidence of mitral stenosis.  4. The aortic valve is grossly normal. Aortic valve regurgitation is not visualized. No aortic stenosis is present.  5. Aortic dilatation noted. There is mild dilatation of  the aortic root, measuring 40 mm. There is mild dilatation of the ascending aorta, measuring 38 mm.  6. The  inferior vena cava is normal in size with greater than 50% respiratory variability, suggesting right atrial pressure of 3 mmHg. Comparison(s): No prior Echocardiogram. FINDINGS  Left Ventricle: Left ventricular ejection fraction, by estimation, is 55 to 60%. The left ventricle has normal function. The left ventricle has no regional wall motion abnormalities. Definity contrast agent was given IV to delineate the left ventricular  endocardial borders. The left ventricular internal cavity size was normal in size. There is mild concentric left ventricular hypertrophy. Left ventricular diastolic parameters are indeterminate. Right Ventricle: The right ventricular size is normal. No increase in right ventricular wall thickness. Right ventricular systolic function is normal. Tricuspid regurgitation signal is inadequate for assessing PA pressure. Left Atrium: Left atrial size was normal in size. Right Atrium: Right atrial size was normal in size. Pericardium: There is no evidence of pericardial effusion. Mitral Valve: The mitral valve is grossly normal. No evidence of mitral valve regurgitation. No evidence of mitral valve stenosis. Tricuspid Valve: The tricuspid valve is grossly normal. Tricuspid valve regurgitation is not demonstrated. No evidence of tricuspid stenosis. Aortic Valve: The aortic valve is grossly normal. Aortic valve regurgitation is not visualized. No aortic stenosis is present. Pulmonic Valve: The pulmonic valve was grossly normal. Pulmonic valve regurgitation is not visualized. No evidence of pulmonic stenosis. Aorta: Aortic dilatation noted. There is mild dilatation of the aortic root, measuring 40 mm. There is mild dilatation of the ascending aorta, measuring 38 mm. Venous: The inferior vena cava is normal in size with greater than 50% respiratory variability, suggesting right atrial pressure of 3 mmHg. IAS/Shunts: No atrial level shunt detected by color flow Doppler.  LEFT VENTRICLE PLAX 2D LVIDd:          4.50 cm   Diastology LVIDs:         3.10 cm   LV e' medial:    8.43 cm/s LV PW:         1.20 cm   LV E/e' medial:  8.5 LV IVS:        1.20 cm   LV e' lateral:   8.89 cm/s LVOT diam:     2.40 cm   LV E/e' lateral: 8.0 LV SV:         60 LV SV Index:   25 LVOT Area:     4.52 cm  RIGHT VENTRICLE RV S prime:     10.70 cm/s LEFT ATRIUM             Index LA diam:        2.70 cm 1.13 cm/m LA Vol (A2C):   38.8 ml 16.24 ml/m LA Vol (A4C):   47.2 ml 19.75 ml/m LA Biplane Vol: 44.6 ml 18.66 ml/m  AORTIC VALVE LVOT Vmax:   61.70 cm/s LVOT Vmean:  45.250 cm/s LVOT VTI:    0.133 m  AORTA Ao Root diam: 4.00 cm Ao STJ diam:  3.1 cm Ao Asc diam:  3.80 cm MITRAL VALVE MV Area (PHT): 4.05 cm    SHUNTS MV Decel Time: 187 msec    Systemic VTI:  0.13 m MV E velocity: 71.43 cm/s  Systemic Diam: 2.40 cm Carolan Clines Electronically signed by Carolan Clines Signature Date/Time: 11/27/2022/9:53:09 AM    Final    CT Angio Chest PE W/Cm &/Or Wo Cm  Result Date: 11/26/2022 CLINICAL DATA:  Shortness of breath for 1 week. Cough.  Clinical concern for pulmonary embolus. EXAM: CT ANGIOGRAPHY CHEST WITH CONTRAST TECHNIQUE: Multidetector CT imaging of the chest was performed using the standard protocol during bolus administration of intravenous contrast. Multiplanar CT image reconstructions and MIPs were obtained to evaluate the vascular anatomy. RADIATION DOSE REDUCTION: This exam was performed according to the departmental dose-optimization program which includes automated exposure control, adjustment of the mA and/or kV according to patient size and/or use of iterative reconstruction technique. CONTRAST:  24mL OMNIPAQUE IOHEXOL 350 MG/ML SOLN COMPARISON:  07/19/2013 FINDINGS: Cardiovascular: The heart size is normal. No substantial pericardial effusion. Coronary artery calcification is evident. Mild atherosclerotic calcification is noted in the wall of the thoracic aorta. There is no filling defect within the opacified pulmonary arteries  to suggest the presence of an acute pulmonary embolus. Mediastinum/Nodes: No mediastinal lymphadenopathy. There is no hilar lymphadenopathy. The esophagus has normal imaging features. There is no axillary lymphadenopathy. Lungs/Pleura: Patchy tree-in-bud nodularity seen posterior right upper lobe , right middle lobe and posterior left upper lobe. Subsegmental atelectasis noted both lower lobes. No dense focal airspace consolidation. No pleural effusion. Upper Abdomen: Tiny layering calcified gallstones evident. The liver shows diffusely decreased attenuation suggesting fat deposition. Musculoskeletal: No worrisome lytic or sclerotic osseous abnormality. Review of the MIP images confirms the above findings. IMPRESSION: 1. No CT evidence for acute pulmonary embolus. 2. Patchy tree-in-bud nodularity in the posterior right upper lobe, right middle lobe, and posterior left upper lobe. Imaging features compatible with infectious/inflammatory etiology with atypical infection a distinct consideration. 3. Cholelithiasis. 4. Hepatic steatosis. 5.  Aortic Atherosclerosis (ICD10-I70.0). Electronically Signed   By: Misty Stanley M.D.   On: 11/26/2022 12:05    Cardiac Studies  TTE 11/27/2022  1. Left ventricular ejection fraction, by estimation, is 55 to 60%. The  left ventricle has normal function. The left ventricle has no regional  wall motion abnormalities. There is mild concentric left ventricular  hypertrophy. Left ventricular diastolic  parameters are indeterminate.   2. Right ventricular systolic function is normal. The right ventricular  size is normal. Tricuspid regurgitation signal is inadequate for assessing  PA pressure.   3. The mitral valve is grossly normal. No evidence of mitral valve  regurgitation. No evidence of mitral stenosis.   4. The aortic valve is grossly normal. Aortic valve regurgitation is not  visualized. No aortic stenosis is present.   5. Aortic dilatation noted. There is mild  dilatation of the aortic root,  measuring 40 mm. There is mild dilatation of the ascending aorta,  measuring 38 mm.   6. The inferior vena cava is normal in size with greater than 50%  respiratory variability, suggesting right atrial pressure of 3 mmHg.   Patient Profile  ZARON VOELTZ is a 81 y.o. male with diabetes, hypertension, hyperlipidemia, CKD stage III, CAD admitted on 11/27/2022 with influenza A and new onset atrial flutter.  Assessment & Plan   #Atrial fibrillation #Atrial flutter -Admitted with influenza A and pneumonia.  Found to have initially atrial flutter.  Review of telemetry shows he was in atrial fibs well but now persistently in atrial flutter with heart rate in the 120s. -He is on Eliquis 5 mg twice daily. -Since he is currently here with a respiratory infection would recommend rate control strategy and outpatient cardioversion in 3 weeks. -We will increase his metoprolol to 50 mg every 6 hours.  We may need to add diltiazem. -Echo normal.  TSH normal. -No signs of heart failure.  Received a one-time dose of  Lasix. -Hopefully we can get his rates better today and he go home tomorrow.  # Elevated troponin, demand ischemia -Troponins are minimally elevated and flat.  This is in the setting of new onset atrial flutter as well as CKD.  He has a known history of CAD. -Echo is normal.  No symptoms of angina.  No plan for ischemia evaluation. -Plan is for aspirin with his Eliquis. -He is on a statin. -No symptoms of angina.    For questions or updates, please contact [place] Please consult [email or web address] for contact info under        Signed, [place] B[place] T. Audie Box, MD, [place]  11/28/2022 10:31 AM

## 2022-11-28 NOTE — Progress Notes (Signed)
[facility] Progress Note Patient: Timothy [name] FIE:[id number] DOB: 1941-05-18 DOA: 11/26/2022  DOS: the patient was seen and examined on 11/28/2022  Brief hospital course: PMH of type II DM, HTN, CAD, HLD present to the hospital with complaints of shortness of breath.  Found to have atrial flutter with RVR.  Currently being treated conservatively with beta-blocker therapy. Assessment and Plan: Atrial flutter with RVR. New onset.  Patient does not feel if he is in flutter or not. On Lopressor 25 mg twice daily. Currently started on Eliquis. Cardiology consulted.  Echocardiogram shows preserved EF.  Now on Lopressor 50 mg 4 times daily.  Monitor on telemetry.  Mild volume overload.  Possible acute diastolic CHF. Treated with Lasix 1 dose.  Currently volume appears to be stable.  Monitor.  Influenza A infection. Currently on Tamiflu. Monitor.  CAD. Currently no evidence of acute ischemia. On aspirin now.  On Plavix prior to admission.  HLD. Continue statin.  HTN. Blood pressure currently soft. Holding Norvasc to allow room for rate control therapy.  Normocytic anemia. H&H stable.  Monitor.  CKD 3B. Serum creatinine currently stable around 1.7. Monitor.  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 Continue sliding scale insulin for now.  Obesity Body mass index is 30.15 kg/m.  Placing the pt at higher risk of poor outcomes.    Subjective: Denies any acute complaint.  No nausea or vomiting.  Heart rate on telemetry has been in 120s.  Physical Exam: General: in mild distress;  Cardiovascular: S1 and S2 Present, no Murmur Respiratory: Normal respiratory effort, Bilateral Air entry present, no Crackles, no wheezes Abdomen: Bowel Sound present, Non tender  Extremities: Trace right leg edema Neurology: alert and oriented to time, place, and person   Data Reviewed: I have Reviewed nursing notes, Vitals, and Lab  results. Since last encounter, pertinent lab results CBC and BMP   . I have ordered test including CBC and BMP  .   Disposition: Status is: Inpatient Remains inpatient appropriate because: Need rate control prior to discharge  SCDs Start: 11/26/22 2352 apixaban (ELIQUIS) tablet 5 mg   Family Communication: Wife at bedside Level of care: Telemetry Cardiac Continue telemetry for RVR. Vitals:   11/28/22 0500 11/28/22 0906 11/28/22 1100 11/28/22 1728  BP:  117/78 (!) 102/59 (!) 150/72  Pulse:  (!) 125 78 92  Resp:  11 18 16   Temp:  98.2 F (36.8 C) 98 F (36.7 C) 98 F (36.7 C)  TempSrc:  Oral  Oral  SpO2:  97%  98%  Weight: 106.5 kg     Height:         Author: , MD 11/28/2022 6:45 PM  Please look on [email or web address] to find out who is on call.

## 2022-11-28 NOTE — Hospital Course (Signed)
PMH of type II DM, HTN, CAD, HLD present to the hospital with complaints of shortness of breath.  Found to have atrial flutter with RVR.  Currently being treated conservatively with beta-blocker therapy.

## 2022-11-29 DIAGNOSIS — I2489 Other forms of acute ischemic heart disease: Secondary | ICD-10-CM | POA: Diagnosis not present

## 2022-11-29 DIAGNOSIS — I483 Typical atrial flutter: Secondary | ICD-10-CM | POA: Diagnosis not present

## 2022-11-29 DIAGNOSIS — I480 Paroxysmal atrial fibrillation: Secondary | ICD-10-CM | POA: Diagnosis not present

## 2022-11-29 LAB — BASIC METABOLIC PANEL
Anion gap: 7 (ref 5–15)
BUN: 24 mg/dL — ABNORMAL HIGH (ref 8–23)
CO2: 28 mmol/L (ref 22–32)
Calcium: 8.3 mg/dL — ABNORMAL LOW (ref 8.9–10.3)
Chloride: 101 mmol/L (ref 98–111)
Creatinine, Ser: 1.86 mg/dL — ABNORMAL HIGH (ref 0.61–1.24)
GFR, Estimated: 35 mL/min — ABNORMAL LOW (ref >60)
Glucose, Bld: 186 mg/dL — ABNORMAL HIGH (ref 70–99)
Potassium: 3.8 mmol/L (ref 3.5–5.1)
Sodium: 136 mmol/L (ref 135–145)

## 2022-11-29 LAB — CBC
HCT: 36.8 % — ABNORMAL LOW (ref 39.0–52.0)
Hemoglobin: 12.3 g/dL — ABNORMAL LOW (ref 13.0–17.0)
MCH: 31.1 pg (ref 26.0–34.0)
MCHC: 33.4 g/dL (ref 30.0–36.0)
MCV: 93.2 fL (ref 80.0–100.0)
Platelets: 242 10*3/uL (ref 150–400)
RBC: 3.95 MIL/uL — ABNORMAL LOW (ref 4.22–5.81)
RDW: 13.1 % (ref 11.5–15.5)
WBC: 8.5 10*3/uL (ref 4.0–10.5)
nRBC: 0 % (ref 0.0–0.2)

## 2022-11-29 LAB — GLUCOSE, CAPILLARY
Glucose-Capillary: 176 mg/dL — ABNORMAL HIGH (ref 70–99)
Glucose-Capillary: 280 mg/dL — ABNORMAL HIGH (ref 70–99)

## 2022-11-29 LAB — MAGNESIUM: Magnesium: 2 mg/dL (ref 1.7–2.4)

## 2022-11-29 MED ORDER — METOPROLOL TARTRATE 100 MG PO TABS: 100.0000 mg | ORAL_TABLET | Freq: Two times a day (BID) | ORAL | 0 refills | Status: DC

## 2022-11-29 MED ORDER — GUAIFENESIN-DM 100-10 MG/5ML PO SYRP: 15.0000 mL | ORAL_SOLUTION | ORAL | 0 refills | Status: DC | PRN

## 2022-11-29 MED ORDER — METOPROLOL TARTRATE 100 MG PO TABS
100.0000 mg | ORAL_TABLET | Freq: Two times a day (BID) | ORAL | Status: DC
  Administered 2022-11-29: 100 mg via ORAL
  Filled 2022-11-29: qty 1

## 2022-11-29 MED ORDER — APIXABAN 5 MG PO TABS: 5.0000 mg | ORAL_TABLET | Freq: Two times a day (BID) | ORAL | 0 refills | Status: DC

## 2022-11-29 MED ORDER — ASPIRIN 81 MG PO TBEC: 81.0000 mg | DELAYED_RELEASE_TABLET | Freq: Every day | ORAL | 2 refills | Status: AC

## 2022-11-29 MED ORDER — OSELTAMIVIR PHOSPHATE 30 MG PO CAPS: 30.0000 mg | ORAL_CAPSULE | Freq: Two times a day (BID) | ORAL | 0 refills | Status: AC

## 2022-11-29 NOTE — Progress Notes (Signed)
Patient and wife given all discharge instructions and verbalized understanding. PIV x 1 removed and patient assisted with dressing. Patient discharged home with wife via wheelchair.

## 2022-11-29 NOTE — Progress Notes (Signed)
Mobility Specialist - Progress Note   11/29/22 1359  Mobility  Activity Ambulated with assistance in room  Level of Assistance Standby assist, set-up cues, supervision of patient - no hands on  Assistive Device None  Distance Ambulated (ft) 40 ft  Activity Response Tolerated well  Mobility Referral Yes  $Mobility charge 1 Mobility   Pt received in room and agreeable to session. No complaints throughout. Pt left in bed with all needs met.   Franki Monte  Mobility Specialist Please contact via Solicitor or Rehab office at [phone]

## 2022-11-29 NOTE — Plan of Care (Signed)
  Problem: Coping: Goal: Ability to adjust to condition or change in health will improve Outcome: Progressing   Problem: Fluid Volume: Goal: Ability to maintain a balanced intake and output will improve Outcome: Progressing   Problem: Health Behavior/Discharge Planning: Goal: Ability to identify and utilize available resources and services will improve Outcome: Progressing   Problem: Nutritional: Goal: Maintenance of adequate nutrition will improve Outcome: Progressing   Problem: Education: Goal: Knowledge of disease or condition will improve Outcome: Progressing   Problem: Activity: Goal: Ability to tolerate increased activity will improve Outcome: Progressing

## 2022-11-29 NOTE — Progress Notes (Signed)
Cardiology Progress Note  Patient ID: Timothy [name] MRN: [id number] DOB: Jul 08, 1941 Date of Encounter: 11/29/2022  Primary Cardiologist: Lauree Chandler, MD  Subjective   Chief Complaint: None.   HPI: Rates well-controlled yesterday.  Elevated this morning.  Suspect he can be discharged.  We will consolidate his metoprolol today.  ROS:  All other ROS reviewed and negative. Pertinent positives noted in the HPI.     Inpatient Medications  Scheduled Meds:  apixaban  5 mg Oral BID   aspirin  81 mg Oral Daily   insulin aspart  0-15 Units Subcutaneous TID WC   insulin glargine-yfgn  10 Units Subcutaneous QHS   metoprolol tartrate  100 mg Oral BID   oseltamivir  30 mg Oral BID   pravastatin  40 mg Oral QPM   Continuous Infusions:  PRN Meds: acetaminophen **OR** acetaminophen, guaiFENesin-dextromethorphan, levalbuterol, melatonin, metoprolol tartrate, ondansetron **OR** ondansetron (ZOFRAN) IV   Vital Signs   Vitals:   11/28/22 1946 11/29/22 0420 11/29/22 0500 11/29/22 0900  BP: (!) 141/115 129/62  119/79  Pulse:  81  97  Resp: 17 16 12 14   Temp: 97.9 F (36.6 C) 98 F (36.7 C)  97.7 F (36.5 C)  TempSrc: Oral Oral  Oral  SpO2: 93% 96%  100%  Weight:   106.5 kg   Height:        Intake/Output Summary (Last 24 hours) at 11/29/2022 1000 Last data filed at 11/29/2022 0900 Gross per 24 hour  Intake --  Output 725 ml  Net -725 ml      11/29/2022    5:00 AM 11/28/2022    5:00 AM 11/26/2022    9:02 AM  Last 3 Weights  Weight (lbs) 234 lb 12.6 oz 234 lb 12.6 oz 250 lb  Weight (kg) 106.5 kg 106.5 kg 113.399 kg      Telemetry  Overnight telemetry shows atrial flutter heart rates 80s to 100s yesterday, which I personally reviewed.    Physical Exam   Vitals:   11/28/22 1946 11/29/22 0420 11/29/22 0500 11/29/22 0900  BP: (!) 141/115 129/62  119/79  Pulse:  81  97  Resp: 17 16 12 14   Temp: 97.9 F (36.6 C) 98 F (36.7 C)  97.7 F (36.5 C)  TempSrc:  Oral Oral  Oral  SpO2: 93% 96%  100%  Weight:   106.5 kg   Height:        Intake/Output Summary (Last 24 hours) at 11/29/2022 1000 Last data filed at 11/29/2022 0900 Gross per 24 hour  Intake --  Output 725 ml  Net -725 ml       11/29/2022    5:00 AM 11/28/2022    5:00 AM 11/26/2022    9:02 AM  Last 3 Weights  Weight (lbs) 234 lb 12.6 oz 234 lb 12.6 oz 250 lb  Weight (kg) 106.5 kg 106.5 kg 113.399 kg    Body mass index is 30.15 kg/m.  General: Well nourished, well developed, in no acute distress Head: Atraumatic, normal size  Eyes: PEERLA, EOMI  Neck: Supple, no JVD Endocrine: No thryomegaly Cardiac: Normal S1, S2; irregular rhythm, no murmurs Lungs: Clear to auscultation bilaterally, no wheezing, rhonchi or rales  Abd: Soft, nontender, no hepatomegaly  Ext: No edema, pulses 2+ Musculoskeletal: No deformities, BUE and BLE strength normal and equal Skin: Warm and dry, no rashes   Neuro: Alert and oriented to person, place, time, and situation, CNII-XII grossly intact, no focal deficits  Psych: Normal mood and affect  Labs  High Sensitivity Troponin:   Recent Labs  Lab 11/26/22 0915 11/26/22 1115 11/27/22 0102  TROPONINIHS 74* 87* 77*     Cardiac EnzymesNo results for input(s): "TROPONINI" in the last 168 hours. No results for input(s): "TROPIPOC" in the last 168 hours.  Chemistry Recent Labs  Lab 11/26/22 0915 11/27/22 0109 11/28/22 0115 11/29/22 0719  NA 140 140 137 136  K 4.7 4.2 4.0 3.8  CL 103 106 102 101  CO2 28 28 24 28   GLUCOSE 185* 142* 190* 186*  BUN 26* 19 22 24*  CREATININE 1.70* 1.61* 1.69* 1.86*  CALCIUM 8.6* 8.6* 8.4* 8.3*  PROT 6.7 6.7  --   --   ALBUMIN 4.0 3.4*  --   --   AST 11* 12*  --   --   ALT 10 14  --   --   ALKPHOS 59 57  --   --   BILITOT 0.8 1.0  --   --   GFRNONAA 40* 42* 40* 35*  ANIONGAP 9 6 11 7     Hematology Recent Labs  Lab 11/27/22 0109 11/28/22 0115 11/29/22 0719  WBC 7.7 7.3 8.5  RBC 3.93* 3.95* 3.95*   HGB 12.3* 12.2* 12.3*  HCT 37.2* 37.7* 36.8*  MCV 94.7 95.4 93.2  MCH 31.3 30.9 31.1  MCHC 33.1 32.4 33.4  RDW 13.3 13.2 13.1  PLT 220 228 242   BNP Recent Labs  Lab 11/26/22 0915  BNP 303.1*    DDimer  Recent Labs  Lab 11/26/22 0915  DDIMER 0.74*     Radiology  No results found.  Cardiac Studies  TTE 11/27/2022  1. Left ventricular ejection fraction, by estimation, is 55 to 60%. The  left ventricle has normal function. The left ventricle has no regional  wall motion abnormalities. There is mild concentric left ventricular  hypertrophy. Left ventricular diastolic  parameters are indeterminate.   2. Right ventricular systolic function is normal. The right ventricular  size is normal. Tricuspid regurgitation signal is inadequate for assessing  PA pressure.   3. The mitral valve is grossly normal. No evidence of mitral valve  regurgitation. No evidence of mitral stenosis.   4. The aortic valve is grossly normal. Aortic valve regurgitation is not  visualized. No aortic stenosis is present.   5. Aortic dilatation noted. 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   6. The inferior vena cava is normal in size with greater than 50%  respiratory variability, suggesting right atrial pressure of 3 mmHg.   Patient Profile  JAQUIN COY is a 81 y.o. male with diabetes, hypertension, hyperlipidemia, CKD stage III, CAD admitted on 11/27/2022 with influenza A and new onset atrial flutter.   Assessment & Plan   # Atrial fibrillation/flutter -Admitted with influenza A and pneumonia.  Found to have flutter.  Seems to have also had atrial fibrillation. -Rates were improved yesterday.  We will consolidate his metoprolol to tartrate 100 mg twice daily.  He can be discharged on this. -Echo normal.  TSH normal. -Continue Eliquis 5 mg twice daily. -We will plan for outpatient cardioversion in 3  weeks.  I will arrange follow-up in 1 to 2 weeks at surgery office.  # Elevated troponin, demand ischemia -Troponins are minimally elevated and flat.  This is in the setting of atrial flutter and underlying CKD.  No symptoms of angina.  Echo was normal.  EKG nonischemic.  He is on a statin. -Plan is for aspirin and Eliquis at discharge.  [place] will sign off.   Medication Recommendations: As above Other recommendations (labs, testing, etc): None Follow up as an outpatient: We will arrange outpatient follow-up in 1 to 2 weeks with Dr. Lauree Chandler.  For questions or updates, please contact [place] Please consult [email or web address] for contact info under        Signed, [place] T. Audie Box, MD, [place]  11/29/2022 10:00 AM

## 2022-11-29 NOTE — Plan of Care (Signed)
Problem: Education: Goal: Ability to describe self-care measures that may prevent or decrease complications (Diabetes Survival Skills Education) will improve Outcome: Adequate for Discharge Goal: Individualized Educational Video(s) Outcome: Adequate for Discharge   Problem: Coping: Goal: Ability to adjust to condition or change in health will improve 11/29/2022 1354 by Dallas Breeding, RN Outcome: Adequate for Discharge 11/29/2022 1104 by Dallas Breeding, RN Outcome: Progressing   Problem: Fluid Volume: Goal: Ability to maintain a balanced intake and output will improve 11/29/2022 1354 by Dallas Breeding, RN Outcome: Adequate for Discharge 11/29/2022 1104 by Dallas Breeding, RN Outcome: Progressing   Problem: Health Behavior/Discharge Planning: Goal: Ability to identify and utilize available resources and services will improve 11/29/2022 1354 by Dallas Breeding, RN Outcome: Adequate for Discharge 11/29/2022 1104 by Dallas Breeding, RN Outcome: Progressing Goal: Ability to manage health-related needs will improve Outcome: Adequate for Discharge   Problem: Metabolic: Goal: Ability to maintain appropriate glucose levels will improve Outcome: Adequate for Discharge   Problem: Nutritional: Goal: Maintenance of adequate nutrition will improve 11/29/2022 1354 by Dallas Breeding, RN Outcome: Adequate for Discharge 11/29/2022 1104 by Dallas Breeding, RN Outcome: Progressing Goal: Progress toward achieving an optimal weight will improve Outcome: Adequate for Discharge   Problem: Skin Integrity: Goal: Risk for impaired skin integrity will decrease Outcome: Adequate for Discharge   Problem: Tissue Perfusion: Goal: Adequacy of tissue perfusion will improve Outcome: Adequate for Discharge   Problem: Education: Goal: Knowledge of disease or condition will improve 11/29/2022 1354 by Dallas Breeding, RN Outcome:  Adequate for Discharge 11/29/2022 1104 by Dallas Breeding, RN Outcome: Progressing Goal: Understanding of medication regimen will improve Outcome: Adequate for Discharge Goal: Individualized Educational Video(s) Outcome: Adequate for Discharge   Problem: Activity: Goal: Ability to tolerate increased activity will improve 11/29/2022 1354 by Dallas Breeding, RN Outcome: Adequate for Discharge 11/29/2022 1104 by Dallas Breeding, RN Outcome: Progressing   Problem: Cardiac: Goal: Ability to achieve and maintain adequate cardiopulmonary perfusion will improve Outcome: Adequate for Discharge   Problem: Health Behavior/Discharge Planning: Goal: Ability to safely manage health-related needs after discharge will improve Outcome: Adequate for Discharge   Problem: Education: Goal: Knowledge of General Education information will improve Description: Including pain rating scale, medication(s)/side effects and non-pharmacologic comfort measures Outcome: Adequate for Discharge   Problem: Health Behavior/Discharge Planning: Goal: Ability to manage health-related needs will improve Outcome: Adequate for Discharge   Problem: Clinical Measurements: Goal: Ability to maintain clinical measurements within normal limits will improve Outcome: Adequate for Discharge Goal: Will remain free from infection Outcome: Adequate for Discharge Goal: Diagnostic test results will improve Outcome: Adequate for Discharge Goal: Respiratory complications will improve Outcome: Adequate for Discharge Goal: Cardiovascular complication will be avoided Outcome: Adequate for Discharge   Problem: Activity: Goal: Risk for activity intolerance will decrease Outcome: Adequate for Discharge   Problem: Nutrition: Goal: Adequate nutrition will be maintained Outcome: Adequate for Discharge   Problem: Coping: Goal: Level of anxiety will decrease Outcome: Adequate for Discharge   Problem:  Elimination: Goal: Will not experience complications related to bowel motility Outcome: Adequate for Discharge Goal: Will not experience complications related to urinary retention Outcome: Adequate for Discharge   Problem: Pain Managment: Goal: General experience of comfort will improve Outcome: Adequate for Discharge   Problem: Safety: Goal: Ability to remain free from injury will improve Outcome: Adequate for Discharge   Problem: Skin Integrity: Goal: Risk for impaired skin integrity will decrease Outcome: Adequate for Discharge

## 2022-11-30 NOTE — Discharge Summary (Signed)
Physician Discharge Summary   Patient: Timothy [name] MRN: [id number] DOB: [birth year]  Admit date:     11/26/2022  Discharge date: 11/29/2022  Discharge Physician: Berle Mull  PCP: Leonard Downing, MD  Recommendations at discharge: Follow-up with PCP in 1 week.  Follow-up with cardiologist, Dr.   Follow-up Information     Sherran Needs, NP Follow up on 12/16/2022.   Specialties: Nurse Practitioner, Cardiology Why: at [facility] for your A fib clinic appointment Contact information: [place] [address] [phone]         Richardson Dopp T, PA-C Follow up.   Specialties: Cardiology, Physician Assistant Why: Hospital follow-up with General Cardiology schedule for 12/30/2022 at 8am. If this date/ time does not work for you, please call our office to reschedule. Contact information: [id number] [address] [phone]         Leonard Downing, MD. Schedule an appointment as soon as possible for a visit in 2 week(s).   Specialty: Family Medicine Contact information: [place] [address] [phone]                Discharge Diagnoses: Principal Problem:   Atrial fibrillation [facility]) Active Problems:   Influenza A   Diabetes ([place])   Normocytic anemia   Essential hypertension   Hyperlipidemia   Coronary artery disease   Stage 3b chronic kidney disease (CKD) (HCC) - baseline SCr 1.7-1.9   Chest pain, rule out acute myocardial infarction   Typical atrial flutter [facility])  Hospital Course: PMH of type II DM, HTN, CAD, HLD present to the hospital with complaints of shortness of breath.  Found to have atrial flutter with RVR.  Currently being treated conservatively with beta-blocker therapy. Assessment and Plan  Atrial flutter with RVR. New onset.  Patient does not feel if he is in flutter or not. On Lopressor 25 mg twice daily. Currently started on Eliquis. Cardiology consulted.   Echocardiogram shows preserved EF.  Now on Lopressor 50 mg 4 times daily.  100 twice daily metoprolol on discharge.   Mild volume overload.  Possible acute diastolic CHF. Treated with Lasix 1 dose.  Currently volume appears to be stable.  Monitor.   Influenza A infection. Currently on Tamiflu. Monitor.   CAD. Currently no evidence of acute ischemia. On aspirin now.  On Plavix prior to admission.  Switch from Plavix to aspirin to minimize bleeding risk while on Eliquis.   HLD. Continue statin.   HTN. Blood pressure currently soft. Holding Norvasc to allow room for rate control therapy.   Normocytic anemia. H&H stable.  Monitor.   CKD 3B. Serum creatinine currently stable around 1.7.   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 Continue home regimen.   Obesity Body mass index is 30.15 kg/m.  Placing the pt at higher risk of poor outcomes.  Consultants:  Cardiology   Procedures performed:  none  DISCHARGE MEDICATION: Allergies as of 11/29/2022   No Known Allergies      Medication List     STOP taking these medications    amLODipine 5 MG tablet Commonly known as: NORVASC   clopidogrel 75 MG tablet Commonly known as: PLAVIX   gentamicin cream 0.1 % Commonly known as: GARAMYCIN       TAKE these medications    apixaban 5 MG Tabs tablet Commonly known as: ELIQUIS Take 1 tablet (5 mg total) by mouth 2 (two) times  daily.   aspirin EC 81 MG tablet Take 1 tablet (81 mg total) by mouth daily. Swallow whole.   guaiFENesin-dextromethorphan 100-10 MG/5ML syrup Commonly known as: ROBITUSSIN DM Take 15 mLs by mouth every 4 (four) hours as needed for cough.   insulin NPH-regular Human (70-30) 100 UNIT/ML injection Inject into the skin.   loratadine 10 MG tablet Commonly known as: CLARITIN Take 10 mg by mouth daily as needed for allergies.   metFORMIN 1000 MG tablet Commonly known as: GLUCOPHAGE Take 1,000 mg  by mouth 2 (two) times daily with a meal.   metoprolol tartrate 100 MG tablet Commonly known as: LOPRESSOR Take 1 tablet (100 mg total) by mouth 2 (two) times daily. What changed:  medication strength how much to take   OneTouch Ultra test strip Generic drug: glucose blood 1 each daily.   oseltamivir 30 MG capsule Commonly known as: TAMIFLU Take 1 capsule (30 mg total) by mouth 2 (two) times daily for 2 days.   pravastatin 40 MG tablet Commonly known as: PRAVACHOL TAKE 1 TABLET BY MOUTH IN  THE EVENING   ReliOn Insulin Syringe 31G X 15/64" 1 ML Misc Generic drug: Insulin Syringe-Needle U-100 USE 1 TWICE DAILY TO INJECT A MAX OF 85 UNITS LABS IN APRIL   traMADol 50 MG tablet Commonly known as: ULTRAM Take by mouth.       Disposition: Home Diet recommendation: Cardiac diet  Discharge Exam: Vitals:   11/29/22 0420 11/29/22 0500 11/29/22 0900 11/29/22 1123  BP: 129/62  119/79 119/64  Pulse: 81  97 85  Resp: 16 12 14 16   Temp: 98 F (36.7 C)  97.7 F (36.5 C) (!) 97.5 F (36.4 C)  TempSrc: Oral  Oral Oral  SpO2: 96%  100% 100%  Weight:  106.5 kg    Height:       General: Appear in no distress; no visible Abnormal Neck Mass Or lumps, Conjunctiva normal Cardiovascular: S1 and S2 Present, no Murmur, Respiratory: good respiratory effort, Bilateral Air entry present and CTA, no Crackles, no wheezes Abdomen: Bowel Sound present, Non tender  Extremities: no Pedal edema Neurology: alert and oriented to time, place, and person  Filed Weights   11/26/22 0902 11/28/22 0500 11/29/22 0500  Weight: 113.4 kg 106.5 kg 106.5 kg   Condition at discharge: stable  The results of significant diagnostics from this hospitalization (including imaging, microbiology, ancillary and laboratory) are listed below for reference.   Imaging Studies: ECHOCARDIOGRAM COMPLETE  Result Date: 11/27/2022    ECHOCARDIOGRAM REPORT   Patient Name:   Timothy [name] [facility] Date of Exam: 11/27/2022  Medical Rec #:  [id number]       Height:       74.0 in Accession #:    [id number]      Weight:       250.0 lb Date of Birth:  1941/11/03       BSA:          2.390 m Patient Age:    81 years        BP:           139/111 mmHg Patient Gender: M               HR:           111 bpm. Exam Location:  Inpatient Procedure: 2D Echo, Cardiac Doppler, Color Doppler and Intracardiac            Opacification Agent Indications:    Atrial Fibrillation I48.91  History:        Patient has no prior history of Echocardiogram examinations.                 CAD; Risk Factors:Hypertension, Diabetes and Dyslipidemia.  Sonographer:    Darlina Sicilian RDCS Referring Phys: [phone] ERIC CHEN  Sonographer Comments: Suboptimal parasternal window, suboptimal apical window and suboptimal subcostal window. IMPRESSIONS  1. Left ventricular ejection fraction, by estimation, is 55 to 60%. The left ventricle has normal function. The left ventricle has no regional wall motion abnormalities. There is mild concentric left ventricular hypertrophy. Left ventricular diastolic parameters are indeterminate.  2. Right ventricular systolic function is normal. The right ventricular size is normal. Tricuspid regurgitation signal is inadequate for assessing PA pressure.  3. The mitral valve is grossly normal. No evidence of mitral valve regurgitation. No evidence of mitral stenosis.  4. The aortic valve is grossly normal. Aortic valve regurgitation is not visualized. No aortic stenosis is present.  5. Aortic dilatation noted. There is mild dilatation of the aortic root, measuring 40 mm. There is mild dilatation of the ascending aorta, measuring 38 mm.  6. The inferior vena cava is normal in size with greater than 50% respiratory variability, suggesting right atrial pressure of 3 mmHg. Comparison(s): No prior Echocardiogram. FINDINGS  Left Ventricle: Left ventricular ejection fraction, by estimation, is 55 to 60%. The left ventricle has normal function. The left ventricle  has no regional wall motion abnormalities. Definity contrast agent was given IV to delineate the left ventricular  endocardial borders. The left ventricular internal cavity size was normal in size. There is mild concentric left ventricular hypertrophy. Left ventricular diastolic parameters are indeterminate. Right Ventricle: The right ventricular size is normal. No increase in right ventricular wall thickness. Right ventricular systolic function is normal. Tricuspid regurgitation signal is inadequate for assessing PA pressure. Left Atrium: Left atrial size was normal in size. Right Atrium: Right atrial size was normal in size. Pericardium: There is no evidence of pericardial effusion. Mitral Valve: The mitral valve is grossly normal. No evidence of mitral valve regurgitation. No evidence of mitral valve stenosis. Tricuspid Valve: The tricuspid valve is grossly normal. Tricuspid valve regurgitation is not demonstrated. No evidence of tricuspid stenosis. Aortic Valve: The aortic valve is grossly normal. Aortic valve regurgitation is not visualized. No aortic stenosis is present. Pulmonic Valve: The pulmonic valve was grossly normal. Pulmonic valve regurgitation is not visualized. No evidence of pulmonic stenosis. Aorta: Aortic dilatation noted. There is mild dilatation of the aortic root, measuring 40 mm. There is mild dilatation of the ascending aorta, measuring 38 mm. Venous: The inferior vena cava is normal in size with greater than 50% respiratory variability, suggesting right atrial pressure of 3 mmHg. IAS/Shunts: No atrial level shunt detected by color flow Doppler.  LEFT VENTRICLE PLAX 2D LVIDd:         4.50 cm   Diastology LVIDs:         3.10 cm   LV e' medial:    8.43 cm/s LV PW:         1.20 cm   LV E/e' medial:  8.5 LV IVS:        1.20 cm   LV e' lateral:   8.89 cm/s LVOT diam:     2.40 cm   LV E/e' lateral: 8.0 LV SV:         60 LV SV Index:   25 LVOT Area:     4.52 cm  RIGHT VENTRICLE RV  S prime:      10.70 cm/s LEFT ATRIUM             Index LA diam:        2.70 cm 1.13 cm/m LA Vol (A2C):   38.8 ml 16.24 ml/m LA Vol (A4C):   47.2 ml 19.75 ml/m LA Biplane Vol: 44.6 ml 18.66 ml/m  AORTIC VALVE LVOT Vmax:   61.70 cm/s LVOT Vmean:  45.250 cm/s LVOT VTI:    0.133 m  AORTA Ao Root diam: 4.00 cm Ao STJ diam:  3.1 cm Ao Asc diam:  3.80 cm MITRAL VALVE MV Area (PHT): 4.05 cm    SHUNTS MV Decel Time: 187 msec    Systemic VTI:  0.13 m MV E velocity: 71.43 cm/s  Systemic Diam: 2.40 cm Phineas Inches Electronically signed by Phineas Inches Signature Date/Time: 11/27/2022/9:53:09 AM    Final    CT Angio Chest PE W/Cm &/Or Wo Cm  Result Date: 11/26/2022 CLINICAL DATA:  Shortness of breath for 1 week. Cough. Clinical concern for pulmonary embolus. EXAM: CT ANGIOGRAPHY CHEST WITH CONTRAST TECHNIQUE: Multidetector CT imaging of the chest was performed using the standard protocol during bolus administration of intravenous contrast. Multiplanar CT image reconstructions and MIPs were obtained to evaluate the vascular anatomy. RADIATION DOSE REDUCTION: This exam was performed according to the departmental dose-optimization program which includes automated exposure control, adjustment of the mA and/or kV according to patient size and/or use of iterative reconstruction technique. CONTRAST:  53mL OMNIPAQUE IOHEXOL 350 MG/ML SOLN COMPARISON:  07/19/2013 FINDINGS: Cardiovascular: The heart size is normal. No substantial pericardial effusion. Coronary artery calcification is evident. Mild atherosclerotic calcification is noted in the wall of the thoracic aorta. There is no filling defect within the opacified pulmonary arteries to suggest the presence of an acute pulmonary embolus. Mediastinum/Nodes: No mediastinal lymphadenopathy. There is no hilar lymphadenopathy. The esophagus has normal imaging features. There is no axillary lymphadenopathy. Lungs/Pleura: Patchy tree-in-bud nodularity seen posterior right upper lobe , right middle  lobe and posterior left upper lobe. Subsegmental atelectasis noted both lower lobes. No dense focal airspace consolidation. No pleural effusion. Upper Abdomen: Tiny layering calcified gallstones evident. The liver shows diffusely decreased attenuation suggesting fat deposition. Musculoskeletal: No worrisome lytic or sclerotic osseous abnormality. Review of the MIP images confirms the above findings. IMPRESSION: 1. No CT evidence for acute pulmonary embolus. 2. Patchy tree-in-bud nodularity in the posterior right upper lobe, right middle lobe, and posterior left upper lobe. Imaging features compatible with infectious/inflammatory etiology with atypical infection a distinct consideration. 3. Cholelithiasis. 4. Hepatic steatosis. 5.  Aortic Atherosclerosis (ICD10-I70.0). Electronically Signed   By: Misty Stanley M.D.   On: 11/26/2022 12:05   US Venous Img Lower Right (DVT Study)  Result Date: 11/26/2022 CLINICAL DATA:  Short of breath, right lower extremity pain EXAM: RIGHT LOWER EXTREMITY VENOUS DOPPLER ULTRASOUND TECHNIQUE: Gray-scale sonography with compression, as well as color and duplex ultrasound, were performed to evaluate the deep venous system(s) from the level of the common femoral vein through the popliteal and proximal calf veins. COMPARISON:  None Available. FINDINGS: VENOUS Normal compressibility of the common femoral, superficial femoral, and popliteal veins, as well as the visualized calf veins. Visualized portions of profunda femoral vein and great saphenous vein unremarkable. No filling defects to suggest DVT on grayscale or color Doppler imaging. Doppler waveforms show normal direction of venous flow, normal respiratory plasticity and response to augmentation. Limited views of the contralateral common femoral vein are unremarkable. OTHER None. Limitations:  none IMPRESSION: Negative. Electronically Signed   By: Malachy Moan M.D.   On: 11/26/2022 10:24   DG Chest 2 View  Result Date:  11/26/2022 CLINICAL DATA:  Provided history: Shortness of breath, right leg swelling. Productive cough. Sick contacts. EXAM: CHEST - 2 VIEW COMPARISON:  Prior chest radiographs 07/06/2013 and earlier. Chest CT 07/19/2013. FINDINGS: Shallow inspiration radiograph. Heart size within normal limits. Aortic atherosclerosis. Small linear opacities within the left lung base, which may reflect atelectasis or scarring. No appreciable airspace consolidation. No evidence of pleural effusion or pneumothorax. Degenerative changes of the spine. IMPRESSION: 1. Shallow inspiration radiograph. 2. Small linear opacities within the left lung base, which may reflect atelectasis or scarring. 3. Otherwise, no evidence of acute cardiopulmonary abnormality. 4. Aortic Atherosclerosis (ICD10-I70.0). Electronically Signed   By: Jackey Loge D.O.   On: 11/26/2022 10:05    Microbiology: Results for orders placed or performed during the hospital encounter of 11/26/22  Resp panel by RT-PCR (RSV, Flu A&B, Covid) Anterior Nasal Swab     Status: Abnormal   Collection Time: 11/26/22  9:15 AM   Specimen: Anterior Nasal Swab  Result Value Ref Range Status   SARS Coronavirus 2 by RT PCR NEGATIVE NEGATIVE Final    Comment: (NOTE) SARS-CoV-2 target nucleic acids are NOT DETECTED.  The SARS-CoV-2 RNA is generally detectable in upper respiratory specimens during the acute phase of infection. The lowest concentration of SARS-CoV-2 viral copies this assay can detect is 138 copies/mL. A negative result does not preclude SARS-Cov-2 infection and should not be used as the sole basis for treatment or other patient management decisions. A negative result may occur with  improper specimen collection/handling, submission of specimen other than nasopharyngeal swab, presence of viral mutation(s) within the areas targeted by this assay, and inadequate number of viral copies(<138 copies/mL). A negative result must be combined with clinical  observations, patient history, and epidemiological information. The expected result is Negative.  Fact Sheet for Patients:  BloggerCourse.com  Fact Sheet for Healthcare Providers:  SeriousBroker.it  This test is no t yet approved or cleared by the [place] FDA and  has been authorized for detection and/or diagnosis of SARS-CoV-2 by FDA under an Emergency Use Authorization (EUA). This EUA will remain  in effect (meaning this test can be used) for the duration of the COVID-19 declaration under Section 564(b)(1) of the Act, 21 U.S.C.section 360bbb-3(b)(1), unless the authorization is terminated  or revoked sooner.       Influenza A by PCR POSITIVE (A) NEGATIVE Final   Influenza B by PCR NEGATIVE NEGATIVE Final    Comment: (NOTE) The Xpert Xpress SARS-CoV-2/FLU/RSV plus assay is intended as an aid in the diagnosis of influenza from Nasopharyngeal swab specimens and should not be used as a sole basis for treatment. Nasal washings and aspirates are unacceptable for Xpert Xpress SARS-CoV-2/FLU/RSV testing.  Fact Sheet for Patients: BloggerCourse.com  Fact Sheet for Healthcare Providers: SeriousBroker.it  This test is not yet approved or cleared by the [place] FDA and has been authorized for detection and/or diagnosis of SARS-CoV-2 by FDA under an Emergency Use Authorization (EUA). This EUA will remain in effect (meaning this test can be used) for the duration of the COVID-19 declaration under Section 564(b)(1) of the Act, 21 U.S.C. section 360bbb-3(b)(1), unless the authorization is terminated or revoked.     Resp Syncytial Virus by PCR NEGATIVE NEGATIVE Final    Comment: (NOTE) Fact Sheet for Patients: BloggerCourse.com  Fact Sheet for Healthcare Providers: SeriousBroker.it  This test is not yet approved or cleared  by the [place] and has been authorized for detection and/or diagnosis of SARS-CoV-2 by FDA under an Emergency Use Authorization (EUA). This EUA will remain in effect (meaning this test can be used) for the duration of the COVID-19 declaration under Section 564(b)(1) of the Act, 21 U.S.C. section 360bbb-3(b)(1), unless the authorization is terminated or revoked.  Performed at [facility], [address] [place], [place] [address]    Labs: CBC: Recent Labs  Lab 11/26/22 0915 11/27/22 0109 11/28/22 0115 11/29/22 0719  WBC 6.0 7.7 7.3 8.5  NEUTROABS 3.9 5.1  --   --   HGB 11.8* 12.3* 12.2* 12.3*  HCT 36.5* 37.2* 37.7* 36.8*  MCV 96.3 94.7 95.4 93.2  PLT 206 220 228 242   Basic Metabolic Panel: Recent Labs  Lab 11/26/22 0915 11/27/22 0109 11/28/22 0115 11/29/22 0719  NA 140 140 137 136  K 4.7 4.2 4.0 3.8  CL 103 106 102 101  CO2 28 28 24 28   GLUCOSE 185* 142* 190* 186*  BUN 26* 19 22 24*  CREATININE 1.70* 1.61* 1.69* 1.86*  CALCIUM 8.6* 8.6* 8.4* 8.3*  MG  --  2.2  --  2.0   Liver Function Tests: Recent Labs  Lab 11/26/22 0915 11/27/22 0109  AST 11* 12*  ALT 10 14  ALKPHOS 59 57  BILITOT 0.8 1.0  PROT 6.7 6.7  ALBUMIN 4.0 3.4*   CBG: Recent Labs  Lab 11/28/22 1104 11/28/22 1726 11/28/22 2125 11/29/22 0612 11/29/22 1121  GLUCAP 296* 215* 218* 176* 280*    Discharge time spent: greater than 30 minutes.  Signed: 12/01/22, MD [facility] 11/29/2022

## 2022-12-08 ENCOUNTER — Ambulatory Visit (HOSPITAL_COMMUNITY)
Admission: RE | Admit: 2022-12-08 | Discharge: 2022-12-08 | Disposition: A | Discharge status: Home / Self Care | Payer: Medicare Other | Source: Ambulatory Visit | Attending: Nurse Practitioner | Admitting: Nurse Practitioner

## 2022-12-08 ENCOUNTER — Encounter (HOSPITAL_COMMUNITY): Payer: Self-pay | Admitting: Physician Assistant

## 2022-12-08 VITALS — BP 146/78 | HR 61 | Ht 74.0 in | Wt 253.4 lb

## 2022-12-08 DIAGNOSIS — E669 Obesity, unspecified: Secondary | ICD-10-CM | POA: Diagnosis not present

## 2022-12-08 DIAGNOSIS — E1122 Type 2 diabetes mellitus with diabetic chronic kidney disease: Secondary | ICD-10-CM | POA: Insufficient documentation

## 2022-12-08 DIAGNOSIS — D6869 Other thrombophilia: Secondary | ICD-10-CM

## 2022-12-08 DIAGNOSIS — Z6832 Body mass index (BMI) 32.0-32.9, adult: Secondary | ICD-10-CM | POA: Insufficient documentation

## 2022-12-08 DIAGNOSIS — E785 Hyperlipidemia, unspecified: Secondary | ICD-10-CM | POA: Diagnosis not present

## 2022-12-08 DIAGNOSIS — N183 Chronic kidney disease, stage 3 unspecified: Secondary | ICD-10-CM | POA: Insufficient documentation

## 2022-12-08 DIAGNOSIS — I4892 Unspecified atrial flutter: Secondary | ICD-10-CM | POA: Diagnosis not present

## 2022-12-08 DIAGNOSIS — Z7901 Long term (current) use of anticoagulants: Secondary | ICD-10-CM | POA: Insufficient documentation

## 2022-12-08 DIAGNOSIS — I129 Hypertensive chronic kidney disease with stage 1 through stage 4 chronic kidney disease, or unspecified chronic kidney disease: Secondary | ICD-10-CM | POA: Insufficient documentation

## 2022-12-08 DIAGNOSIS — I483 Typical atrial flutter: Secondary | ICD-10-CM

## 2022-12-08 DIAGNOSIS — I480 Paroxysmal atrial fibrillation: Secondary | ICD-10-CM | POA: Diagnosis present

## 2022-12-08 MED ORDER — METOPROLOL TARTRATE 100 MG PO TABS: 100.0000 mg | ORAL_TABLET | Freq: Two times a day (BID) | ORAL | 3 refills | Status: DC

## 2022-12-08 MED ORDER — APIXABAN 5 MG PO TABS: 5.0000 mg | ORAL_TABLET | Freq: Two times a day (BID) | ORAL | 3 refills | Status: DC

## 2022-12-08 NOTE — Progress Notes (Signed)
Primary Care Physician: Leonard Downing, MD Primary Cardiologist: Dr Angelena Form  Primary Electrophysiologist: none Referring Physician: Dr Baruch Gouty is a 82 y.o. male with a history of HTN, type 2 DM, HLD, CKD III, CAD s/p bare-metal stent in the RCA in 2001 and Cypher DES in the circumflex in 2003, provoked PE 07/2013 off coumadin, diverticular bleeding s/p right hemicolectomy 07/2013, atrial flutter, atrial fibrillation who presents for consultation in the [facility].  The patient was initially diagnosed with atrial fibrillation and atrial flutter 11/26/22 after presenting to the ED with symptoms of SOB and a cough. He was diagnosed with influenza and incidentally found to be in atrial flutter. Per hospital notes, he also had afib on telemetry. His metoprolol was increased and he was started on Eliquis for a CHADS2VASC score of 5. Patient purchased a Kardia mobile device which showed afib for several days after leaving the hospital but for the past 2-3 days it has shown SR. He is in SR today. He denies significant snoring or alcohol use.   Today, he denies symptoms of palpitations, chest pain, shortness of breath, orthopnea, PND, lower extremity edema, dizziness, presyncope, syncope, snoring, daytime somnolence, bleeding, or neurologic sequela. The patient is tolerating medications without difficulties and is otherwise without complaint today.    Atrial Fibrillation Risk Factors:  he does not have symptoms or diagnosis of sleep apnea. he does not have a history of rheumatic fever. he does not have a history of alcohol use. The patient does not have a history of early familial atrial fibrillation or other arrhythmias.  he has a BMI of Body mass index is 32.53 kg/m.Marland Kitchen Filed Weights   12/08/22 1451  Weight: 114.9 kg    Family History  Problem Relation Age of Onset   Cancer Brother        colon cancer     Atrial Fibrillation  Management history:  Previous antiarrhythmic drugs: none Previous cardioversions: none Previous ablations: none CHADS2VASC score: 5 Anticoagulation history: Eliquis   Past Medical History:  Diagnosis Date   Acute lower GI bleeding 07/01/2013   Bilateral pulmonary embolism ([place]) 07/20/2013   CAD (coronary artery disease)    Coronary artery disease    NSTEMI 2001, 2003    Diabetes mellitus    Hemorrhagic shock ([place]) 07/05/2013   History of MI (myocardial infarction)    Hyperlipidemia    Hypertension    Inguinal hernia    Respiratory failure, post-operative ([place]) 07/07/2013   Syncope, carotid sinus    Past Surgical History:  Procedure Laterality Date   APPENDECTOMY     CARDIAC CATHETERIZATION  00,03   stents both times   COLON SURGERY     COLONOSCOPY     COLONOSCOPY N/A 07/06/2013   Procedure: COLONOSCOPY;  Surgeon: Winfield Cunas., MD;  Location: [facility] ENDOSCOPY;  Service: Endoscopy;  Laterality: N/A;  Tattoo,clips,bicap available   ESOPHAGOGASTRODUODENOSCOPY N/A 07/06/2013   Procedure: ESOPHAGOGASTRODUODENOSCOPY (EGD);  Surgeon: Winfield Cunas., MD;  Location: [facility] ENDOSCOPY;  Service: Endoscopy;  Laterality: N/A;  at bedside   EYE SURGERY  2013   Cataracts-both   INGUINAL HERNIA REPAIR Right 06/29/2013   Procedure: HERNIA REPAIR INGUINAL ADULT;  Surgeon: Earnstine Regal, MD;  Location: [place];  Service: General;  Laterality: Right;   INSERTION OF MESH Right 06/29/2013   Procedure: INSERTION OF MESH;  Surgeon: Earnstine Regal, MD;  Location: [place];  Service:  General;  Laterality: Right;   LAPAROTOMY Right 07/06/2013   Procedure: EXPLORATORY LAPAROTOMY;  Surgeon: Harl Bowie, MD;  Location: [place];  Service: General;  Laterality: Right;   None     ROTATOR CUFF REPAIR  2013   lt    Current Outpatient Medications  Medication Sig Dispense Refill   apixaban (ELIQUIS) 5 MG TABS tablet Take 1 tablet (5 mg total) by mouth 2 (two) times  daily. 60 tablet 0   aspirin EC 81 MG tablet Take 1 tablet (81 mg total) by mouth daily. Swallow whole. 150 tablet 2   guaiFENesin-dextromethorphan (ROBITUSSIN DM) 100-10 MG/5ML syrup Take 15 mLs by mouth every 4 (four) hours as needed for cough. 118 mL 0   insulin NPH-regular Human (NOVOLIN 70/30) (70-30) 100 UNIT/ML injection Inject into the skin.     loratadine (CLARITIN) 10 MG tablet Take 10 mg by mouth daily as needed for allergies.     metFORMIN (GLUCOPHAGE) 1000 MG tablet Take 1,000 mg by mouth 2 (two) times daily with a meal.     metoprolol tartrate (LOPRESSOR) 100 MG tablet Take 1 tablet (100 mg total) by mouth 2 (two) times daily. 60 tablet 0   ONETOUCH ULTRA test strip 1 each daily.     pravastatin (PRAVACHOL) 40 MG tablet TAKE 1 TABLET BY MOUTH IN  THE EVENING 90 tablet 2   RELION INSULIN SYRINGE 31G X 15/64" 1 ML MISC USE 1 TWICE DAILY TO INJECT A MAX OF 85 UNITS LABS IN APRIL     traMADol (ULTRAM) 50 MG tablet Take by mouth.     No current facility-administered medications for this encounter.    No Known Allergies  Social History   Socioeconomic History   Marital status: Married    Spouse name: Not on file   Number of children: Not on file   Years of education: Not on file   Highest education level: Not on file  Occupational History   Not on file  Tobacco Use   Smoking status: Never   Smokeless tobacco: Never   Tobacco comments:    Never smoke 12/08/22  Substance and Sexual Activity   Alcohol use: No   Drug use: No   Sexual activity: Not on file  Other Topics Concern   Not on file  Social History Narrative   Not on file   Social Determinants of Health   Financial Resource Strain: Not on file  Food Insecurity: No Food Insecurity (11/26/2022)   Hunger Vital Sign    Worried About Running Out of Food in the Last Year: Never true    Ran Out of Food in the Last Year: Never true  Transportation Needs: No Transportation Needs (11/26/2022)   PRAPARE -  Hydrologist (Medical): No    Lack of Transportation (Non-Medical): No  Physical Activity: Not on file  Stress: Not on file  Social Connections: Not on file  Intimate Partner Violence: Not At Risk (11/26/2022)   Humiliation, Afraid, Rape, and Kick questionnaire    Fear of Current or Ex-Partner: No    Emotionally Abused: No    Physically Abused: No    Sexually Abused: No     ROS- All systems are reviewed and negative except as per the HPI above.  Physical Exam: Vitals:   12/08/22 1451  BP: (!) 146/78  Pulse: 61  Weight: 114.9 kg  Height: 6\' 2"  (1.88 m)    GEN- The patient is a well appearing elderly  male, alert and oriented x 3 today.   Head- normocephalic, atraumatic Eyes-  Sclera clear, conjunctiva pink Ears- hearing intact Oropharynx- clear Neck- supple  Lungs- Clear to ausculation bilaterally, normal work of breathing Heart- Regular rate and rhythm, no murmurs, rubs or gallops  GI- soft, NT, ND, + BS Extremities- no clubbing, cyanosis, or edema MS- no significant deformity or atrophy Skin- no rash or lesion Psych- euthymic mood, full affect Neuro- strength and sensation are intact  Wt Readings from Last 3 Encounters:  12/08/22 114.9 kg  11/29/22 106.5 kg  01/16/22 114.1 kg    EKG today demonstrates  SR, 1st degree AV block, slow R wave prog Vent. rate 61 BPM PR interval 210 ms QRS duration 120 ms QT/QTcB 426/428 ms  Echo 11/27/22 demonstrated   1. Left ventricular ejection fraction, by estimation, is 55 to 60%. The  left ventricle has normal function. The left ventricle has no regional  wall motion abnormalities. There is mild concentric left ventricular  hypertrophy. Left ventricular diastolic parameters are indeterminate.   2. Right ventricular systolic function is normal. The right ventricular  size is normal. Tricuspid regurgitation signal is inadequate for assessing  PA pressure.   3. The mitral valve is grossly normal.  No evidence of mitral valve  regurgitation. No evidence of mitral stenosis.   4. The aortic valve is grossly normal. Aortic valve regurgitation is not  visualized. No aortic stenosis is present.   5. Aortic dilatation noted. 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   6. The inferior vena cava is normal in size with greater than 50%  respiratory variability, suggesting right atrial pressure of 3 mmHg.    Epic records are reviewed at length today  CHA2DS2-VASc Score = 5  The patient's score is based upon: CHF History: 0 HTN History: 1 Diabetes History: 1 Stroke History: 0 Vascular Disease History: 1 Age Score: 2 Gender Score: 0       ASSESSMENT AND PLAN: 1. Paroxysmal Atrial Fibrillation/atrial flutter The patient's CHA2DS2-VASc score is 5, indicating a 7.2% annual risk of stroke.   General education about afib provided and questions answered. We also discussed his stroke risk and the risks and benefits of anticoagulation. Patient has converted to SR. Continue Eliquis 5 mg BID Continue Lopressor 100 mg BID We discussed rhythm control options. It is possible that his afib was related to his acute illness. Patient would like to continue present therapy for now and monitor his afib with his Kardia mobile. If he has more afib, he would consider AAD. Age is borderline for ablation.   2. Secondary Hypercoagulable State (ICD10:  D68.69) The patient is at significant risk for stroke/thromboembolism based upon his CHA2DS2-VASc Score of 5.  Continue Apixaban (Eliquis).   3. Obesity Body mass index is 32.53 kg/m. Lifestyle modification was discussed at length including regular exercise and weight reduction. Offered referral to [facility], patient declined for now.  4. HTN Stable, no changes today. Well controlled by home readings.  5. CAD No anginal symptoms.    Follow up with Richardson Dopp as  scheduled. AF clinic in 3 months.    [facility] [address] [place], [place] [address] [phone] 12/08/2022 3:27 PM

## 2022-12-14 ENCOUNTER — Other Ambulatory Visit (HOSPITAL_COMMUNITY): Payer: Self-pay | Admitting: *Deleted

## 2022-12-14 MED ORDER — APIXABAN 5 MG PO TABS: 5.0000 mg | ORAL_TABLET | Freq: Two times a day (BID) | ORAL | 3 refills | Status: DC

## 2022-12-16 ENCOUNTER — Ambulatory Visit (HOSPITAL_COMMUNITY): Payer: Medicare Other | Admitting: Nurse Practitioner

## 2022-12-21 ENCOUNTER — Other Ambulatory Visit: Payer: Self-pay | Admitting: Cardiovascular Disease

## 2022-12-29 NOTE — Progress Notes (Unsigned)
Cardiology Office Note:    Date:  12/30/2022   ID:  Timothy [name], DOB 02/17/1941, MRN [id number]  PCP:  Leonard Downing, MD  [facility] Cardiologist:  Lauree Chandler, MD     Referring MD: Leonard Downing, *   Patient Profile: Coronary artery disease S/p NSTEMI in 2001 tx w BMS to RCA, 2003 tx w Cypher DES to LCx Myoview in 01/2010: no ischemia, EF 55 Paroxysmal atrial fibrillation In setting of Flu A/pneumonia 12/2022 TTE 11/27/2022: EF 55-60, no RWMA, mild LVH, normal RVSF, mild dilation of aortic root (40 mm), mild dilation of ascending aorta (30 mm), RAP 3 Diabetes mellitus  Hypertension  Hyperlipidemia  Chronic kidney disease  Hx of pulmonary embolism in 2014 Hx of LGI bleed (diverticular; complication of hernia repair) in 2014; s/p R partial colectomy     History of Present Illness:   Timothy [name] is a 82 y.o. male with the above problem list.  He was last seen by Dr. Angelena Form in Feb 2023. He was admitted 12/21-12/24 with Flu A/pneumonia and new onset atrial fibrillation/flutter with RVR. His hsTrops were minimally elevated and flat c/w demand ischemia. He was placed on metoprolol tartrate for rate control, Eliquis for anticoagulation and his Plavix was changed to ASA. Plan was to pursue DCCV as an OP. He was seen in the AFib clinic 12/08/22. Notes indicate he was back in NSR but the EKG has not been scanned into the chart yet. He returns for post hospital f/u after admission for Flu A/pneumonia c/b AFib/Flutter with RVR.   He is here today with his wife.  Since discharge, he has continued to feel weak ("washed out").  He also has some vague chest discomfort.  He cannot really describe it.  He notes exertional shortness of breath.  He is concerned about how much activity he should be doing.  He has had to sleep in a recliner due to orthopnea.  He has also noted lower extremity edema.  However, his weights have been stable.  He has not had  syncope.  He has a Paramedic.  This has indicated several episodes of atrial fibrillation.  He has been able to correlate feeling well when in sinus rhythm.  EKG: Coarse atrial fibrillation, HR 62, left axis deviation, incomplete right bundle branch block, nonspecific ST-T wave changes, QTc 418      Reviewed and updated this encounter:  Tobacco  Allergies  Meds  Problems  Med Hx  Surg Hx  Fam Hx     Review of Systems  Constitutional: Negative for fever.  Respiratory:  Negative for cough.   Gastrointestinal:  Negative for hematochezia, melena and vomiting.  Genitourinary:  Negative for hematuria.    Labs/Other Test Reviewed:   Recent Labs: 11/26/2022: B Natriuretic Peptide 303.1 11/27/2022: ALT 14; TSH 3.750 11/29/2022: BUN 24; Creatinine, Ser 1.86; Hemoglobin 12.3; Magnesium 2.0; Platelets 242; Potassium 3.8; Sodium 136   Recent Lipid Panel Recent Labs    11/27/22 0109  CHOL 109  TRIG 77  HDL 29*  VLDL 15  LDLCALC 65     Risk Assessment/Calculations/Metrics:    CHA2DS2-VASc Score = 5   This indicates a 7.2% annual risk of stroke. The patient's score is based upon: CHF History: 0 HTN History: 1 Diabetes History: 1 Stroke History: 0 Vascular Disease History: 1 Age Score: 2 Gender Score: 0            Physical Exam:   VS:  BP  130/70   Pulse 94   Ht 6\' 2"  (1.88 m)   Wt 249 lb 9.6 oz (113.2 kg)   SpO2 96%   BMI 32.05 kg/m    Wt Readings from Last 3 Encounters:  12/30/22 249 lb 9.6 oz (113.2 kg)  12/08/22 253 lb 6.4 oz (114.9 kg)  11/29/22 234 lb 12.6 oz (106.5 kg)    Constitutional:      Appearance: Healthy appearance. Not in distress.  Neck:     Vascular: JVR present. JVD normal.  Pulmonary:     Breath sounds: No wheezing. Rales: coarse breath sounds at the bases..  Cardiovascular:     Normal rate. Irregularly irregular rhythm.     Murmurs: There is no murmur.  Edema:    Peripheral edema present.    Pretibial: bilateral 1+ edema of  the pretibial area.    Ankle: bilateral 1+ edema of the ankle. Abdominal:     Palpations: Abdomen is soft. There is no hepatomegaly.  Skin:    General: Skin is warm and dry.  Neurological:     General: No focal deficit present.         ASSESSMENT & PLAN:   (HFpEF) heart failure with preserved ejection fraction ([place]) He had evidence of volume overload in the hospital and was given a dose of Lasix.  EF was normal on echocardiogram.  He has symptoms of volume excess with orthopnea and dyspnea with exertion.  He also has signs of volume excess on exam with HJR and lower extremity edema.  He has coarse breath sounds at the bases as well.  I suspect volume overload is contributing to his symptoms.  I recommend furosemide for the next several days.  I will also obtain a BNP.  If this is significantly elevated, I will keep him on furosemide for longer. Furosemide 40 mg daily x 3 days, then as needed BMET, CBC, BNP Follow-up 2 to 3 weeks  PAF (paroxysmal atrial fibrillation) ([place]) He was in sinus rhythm when he went to the atrial fibrillation clinic on 1/2.  He is back in atrial fibrillation today.  He has a Kardia mobile device which has demonstrated evidence of sinus rhythm and atrial fibrillation.  He has been able to correlate feeling well and being in sinus rhythm at least once.  Since he had sinus rhythm documented since discharge and is now back in atrial fibrillation, I am not certain that we need to proceed directly to cardioversion.  Atrial fibrillation may be playing a role in how he is feeling as well as volume excess.  His rate is well-controlled.  I did consider decreasing his beta-blocker in case he was having side effects.  However, as his rate is well-controlled, I will continue his current dose for now.  He is over 69 years old and his recent creatinines have been more than 1.5. Continue metoprolol tartrate 100 mg twice daily Obtain follow-up BMET today If creatinine remains above 1.5,  I will change his Eliquis to 2.5 mg twice daily Eliquis 2.5 mg samples were given today 14-day ZIO to assess A-fib burden If he has been in A-fib 100% of the time when he returns, arrange cardioversion  Coronary artery disease involving native coronary artery of native heart without angina pectoris History of non-STEMI in 2001 through the BMS to the RCA and 2003 treated with Cypher DES to the LCx.  Myoview in 2011 was negative for ischemia.  He had elevated troponins in the hospital.  These were flat  and consistent with demand ischemia.  He has had some vague chest discomfort since discharge.  I suspect that this is all from volume excess,?  Atrial fibrillation.  At this point, I do not think he needs stress testing.  At follow-up, if his volume is improved and he is still having symptoms, consider stress testing. Continue aspirin 81 mg daily, pravastatin 40 mg daily Consider Myoview if chest symptoms continue despite adequate diuresis  Essential hypertension Fair control.  Continue metoprolol tartrate 100 mg twice daily.  Stage 3b chronic kidney disease (CKD) (HCC) - baseline SCr 1.7-1.9 Obtain follow-up BMET today.  As noted, I will likely need to reduce his dose of Eliquis.        Dispo:  Return in about 3 weeks (around 01/20/2023) for Close Follow Up with Dr. Clifton James, or Tereso Newcomer, PA-C.   Signed, Tereso Newcomer, PA-C  12/30/2022 8:58 AM    [facility] [address] [place], [place], [place]  [address] Phone: [phone]; Fax: [phone]

## 2022-12-30 ENCOUNTER — Encounter: Payer: Self-pay | Admitting: Physician Assistant

## 2022-12-30 ENCOUNTER — Ambulatory Visit (INDEPENDENT_AMBULATORY_CARE_PROVIDER_SITE_OTHER): Payer: Medicare Other

## 2022-12-30 ENCOUNTER — Ambulatory Visit: Payer: Medicare Other | Attending: Physician Assistant | Admitting: Physician Assistant

## 2022-12-30 VITALS — BP 130/70 | HR 94 | Ht 74.0 in | Wt 249.6 lb

## 2022-12-30 DIAGNOSIS — I10 Essential (primary) hypertension: Secondary | ICD-10-CM | POA: Diagnosis not present

## 2022-12-30 DIAGNOSIS — I480 Paroxysmal atrial fibrillation: Secondary | ICD-10-CM | POA: Diagnosis not present

## 2022-12-30 DIAGNOSIS — I2510 Atherosclerotic heart disease of native coronary artery without angina pectoris: Secondary | ICD-10-CM

## 2022-12-30 DIAGNOSIS — I5033 Acute on chronic diastolic (congestive) heart failure: Secondary | ICD-10-CM

## 2022-12-30 DIAGNOSIS — N1832 Chronic kidney disease, stage 3b: Secondary | ICD-10-CM

## 2022-12-30 DIAGNOSIS — I5030 Unspecified diastolic (congestive) heart failure: Secondary | ICD-10-CM | POA: Insufficient documentation

## 2022-12-30 MED ORDER — FUROSEMIDE 40 MG PO TABS: ORAL_TABLET | ORAL | 1 refills | Status: DC

## 2022-12-30 NOTE — Progress Notes (Unsigned)
Applied a 14 day Zio Xt monitor to patient in the office  Dr Angelena Form to read

## 2022-12-30 NOTE — Assessment & Plan Note (Signed)
He was in sinus rhythm when he went to the atrial fibrillation clinic on 1/2.  He is back in atrial fibrillation today.  He has a Kardia mobile device which has demonstrated evidence of sinus rhythm and atrial fibrillation.  He has been able to correlate feeling well and being in sinus rhythm at least once.  Since he had sinus rhythm documented since discharge and is now back in atrial fibrillation, I am not certain that we need to proceed directly to cardioversion.  Atrial fibrillation may be playing a role in how he is feeling as well as volume excess.  His rate is well-controlled.  I did consider decreasing his beta-blocker in case he was having side effects.  However, as his rate is well-controlled, I will continue his current dose for now.  He is over 82 years old and his recent creatinines have been more than 1.5. Continue metoprolol tartrate 100 mg twice daily Obtain follow-up BMET today If creatinine remains above 1.5, I will change his Eliquis to 2.5 mg twice daily Eliquis 2.5 mg samples were given today 14-day ZIO to assess A-fib burden If he has been in A-fib 100% of the time when he returns, arrange cardioversion

## 2022-12-30 NOTE — Patient Instructions (Signed)
Medication Instructions:  Your physician has recommended you make the following change in your medication:   START Lasix 40 mg taking 1 tablet a day for 3 days then reduce to only as needed for Edema/Swelling  *If you need a refill on your cardiac medications before your next appointment, please call your pharmacy*   Lab Work: TODAY: BMET, CBC, & PRO BNP   If you have labs (blood work) drawn today and your tests are completely normal, you will receive your results only by: [place] (if you have MyChart) OR A paper copy in the mail If you have any lab test that is abnormal or we need to change your treatment, we will call you to review the results.   Testing/Procedures: [place]- Long Term Monitor Instructions  Your physician has requested you wear a ZIO patch monitor for 14 days.  This is a single patch monitor. [employer] supplies one patch monitor per enrollment. Additional stickers are not available. Please do not apply patch if you will be having a Nuclear Stress Test,  Echocardiogram, Cardiac CT, MRI, or Chest Xray during the period you would be wearing the  monitor. The patch cannot be worn during these tests. You cannot remove and re-apply the  ZIO XT patch monitor.  Your ZIO patch monitor will be mailed 3 day USPS to your address on file. It may take 3-5 days  to receive your monitor after you have been enrolled.  Once you have received your monitor, please review the enclosed instructions. Your monitor  has already been registered assigning a specific monitor serial # to you.  Billing and Patient Assistance Program Information  We have supplied [employer] with any of your insurance information on file for billing purposes. [employer] offers a sliding scale Patient Assistance Program for patients that do not have  insurance, or whose insurance does not completely cover the cost of the ZIO monitor.  You must apply for the Patient Assistance Program to qualify for this  discounted rate.  To apply, please call [employer] at [phone], select option 4, select option 2, ask to apply for  Patient Assistance Program. Theodore Demark will ask your household income, and how many people  are in your household. They will quote your out-of-pocket cost based on that information.  [employer] will also be able to set up a 103-month, interest-free payment plan if needed.  Applying the monitor   Shave hair from upper left chest.  Hold abrader disc by orange tab. Rub abrader in 40 strokes over the upper left chest as  indicated in your monitor instructions.  Clean area with 4 enclosed alcohol pads. Let dry.  Apply patch as indicated in monitor instructions. Patch will be placed under collarbone on left  side of chest with arrow pointing upward.  Rub patch adhesive wings for 2 minutes. Remove white label marked "1". Remove the white  label marked "2". Rub patch adhesive wings for 2 additional minutes.  While looking in a mirror, press and release button in center of patch. A small green light will  flash 3-4 times. This will be your only indicator that the monitor has been turned on.  Do not shower for the first 24 hours. You may shower after the first 24 hours.  Press the button if you feel a symptom. You will hear a small click. Record Date, Time and  Symptom in the Patient Logbook.  When you are ready to remove the patch, follow instructions on the last 2 pages of  Patient  Logbook. Stick patch monitor onto the last page of Patient Logbook.  Place Patient Logbook in the blue and white box. Use locking tab on box and tape box closed  securely. The blue and white box has prepaid postage on it. Please place it in the mailbox as  soon as possible. Your physician should have your test results approximately 7 days after the  monitor has been mailed back to [facility].  Call [place] at [phone] if you have questions regarding  your ZIO XT patch monitor. Call  them immediately if you see an orange light blinking on your  monitor.  If your monitor falls off in less than 4 days, contact our Monitor department at [phone].  If your monitor becomes loose or falls off after 4 days call [employer] at [phone] for  suggestions on securing your monitor    Follow-Up: At [facility], you and your health needs are our priority.  As part of our continuing mission to provide you with exceptional heart care, we have created designated Provider Care Teams.  These Care Teams include your primary Cardiologist (physician) and Advanced Practice Providers (APPs -  Physician Assistants and Nurse Practitioners) who all work together to provide you with the care you need, when you need it.  We recommend signing up for the patient portal called "MyChart".  Sign up information is provided on this After Visit Summary.  MyChart is used to connect with patients for Virtual Visits (Telemedicine).  Patients are able to view lab/test results, encounter notes, upcoming appointments, etc.  Non-urgent messages can be sent to your provider as well.   To learn more about what you can do with MyChart, go to [email or web address].    Your next appointment:   2 week(s)  Provider:   Richardson Dopp, PA-C         Other Instructions

## 2022-12-30 NOTE — Assessment & Plan Note (Signed)
He had evidence of volume overload in the hospital and was given a dose of Lasix.  EF was normal on echocardiogram.  He has symptoms of volume excess with orthopnea and dyspnea with exertion.  He also has signs of volume excess on exam with HJR and lower extremity edema.  He has coarse breath sounds at the bases as well.  I suspect volume overload is contributing to his symptoms.  I recommend furosemide for the next several days.  I will also obtain a BNP.  If this is significantly elevated, I will keep him on furosemide for longer. Furosemide 40 mg daily x 3 days, then as needed BMET, CBC, BNP Follow-up 2 to 3 weeks

## 2022-12-30 NOTE — Assessment & Plan Note (Signed)
History of non-STEMI in 2001 through the BMS to the RCA and 2003 treated with Cypher DES to the LCx.  Myoview in 2011 was negative for ischemia.  He had elevated troponins in the hospital.  These were flat and consistent with demand ischemia.  He has had some vague chest discomfort since discharge.  I suspect that this is all from volume excess,?  Atrial fibrillation.  At this point, I do not think he needs stress testing.  At follow-up, if his volume is improved and he is still having symptoms, consider stress testing. Continue aspirin 81 mg daily, pravastatin 40 mg daily Consider Myoview if chest symptoms continue despite adequate diuresis

## 2022-12-30 NOTE — Assessment & Plan Note (Signed)
Fair control.  Continue metoprolol tartrate 100 mg twice daily.

## 2022-12-30 NOTE — Assessment & Plan Note (Signed)
Obtain follow-up BMET today.  As noted, I will likely need to reduce his dose of Eliquis.

## 2022-12-31 ENCOUNTER — Other Ambulatory Visit: Payer: Medicare Other

## 2022-12-31 ENCOUNTER — Telehealth: Payer: Self-pay | Admitting: *Deleted

## 2022-12-31 DIAGNOSIS — I480 Paroxysmal atrial fibrillation: Secondary | ICD-10-CM

## 2022-12-31 LAB — BASIC METABOLIC PANEL
BUN/Creatinine Ratio: 15 (ref 10–24)
BUN: 26 mg/dL (ref 8–27)
CO2: 27 mmol/L (ref 20–29)
Calcium: 9 mg/dL (ref 8.6–10.2)
Chloride: 101 mmol/L (ref 96–106)
Creatinine, Ser: 1.68 mg/dL — ABNORMAL HIGH (ref 0.76–1.27)
Glucose: 94 mg/dL (ref 70–99)
Potassium: 4.9 mmol/L (ref 3.5–5.2)
Sodium: 139 mmol/L (ref 134–144)
eGFR: 40 mL/min/{1.73_m2} — ABNORMAL LOW (ref >59)

## 2022-12-31 LAB — CBC
Hematocrit: 38.6 % (ref 37.5–51.0)
Hemoglobin: 12.7 g/dL — ABNORMAL LOW (ref 13.0–17.7)
MCH: 31.2 pg (ref 26.6–33.0)
MCHC: 32.9 g/dL (ref 31.5–35.7)
MCV: 95 fL (ref 79–97)
Platelets: 271 10*3/uL (ref 150–450)
RBC: 4.07 x10E6/uL — ABNORMAL LOW (ref 4.14–5.80)
RDW: 13 % (ref 11.6–15.4)
WBC: 7.8 10*3/uL (ref 3.4–10.8)

## 2022-12-31 LAB — PRO B NATRIURETIC PEPTIDE: NT-Pro BNP: 1577 pg/mL — ABNORMAL HIGH (ref 0–486)

## 2022-12-31 MED ORDER — APIXABAN 2.5 MG PO TABS: 2.5000 mg | ORAL_TABLET | Freq: Two times a day (BID) | ORAL | 11 refills | Status: DC

## 2022-12-31 MED ORDER — FUROSEMIDE 40 MG PO TABS: 40.0000 mg | ORAL_TABLET | Freq: Every day | ORAL | 3 refills | Status: DC

## 2022-12-31 NOTE — Telephone Encounter (Signed)
-----  Message from Liliane Shi, PA-C sent at 12/31/2022  7:58 AM EST ----- Creatinine stable. K+ normal. BNP elevated. Hgb stable.  Pt > 82 yo and Creatinine has been consistently > 1.5. He should be on Eliquis 2.5 mg  PLAN:  -Decrease Eliquis to 2.5 mg twice daily - he will need a Rx sent to his pharmacy  -Continue Lasix 40 mg once daily (do not change to prn after 3 days) -BMET 1 week -Continue other medications the same and f/u as planned.  Richardson Dopp, PA-C    12/31/2022 7:47 AM

## 2023-01-07 ENCOUNTER — Ambulatory Visit: Payer: Medicare Other | Attending: Physician Assistant

## 2023-01-07 DIAGNOSIS — I480 Paroxysmal atrial fibrillation: Secondary | ICD-10-CM

## 2023-01-08 LAB — BASIC METABOLIC PANEL WITH GFR
BUN/Creatinine Ratio: 16 (ref 10–24)
BUN: 28 mg/dL — ABNORMAL HIGH (ref 8–27)
CO2: 29 mmol/L (ref 20–29)
Calcium: 8.6 mg/dL (ref 8.6–10.2)
Chloride: 98 mmol/L (ref 96–106)
Creatinine, Ser: 1.75 mg/dL — ABNORMAL HIGH (ref 0.76–1.27)
Glucose: 111 mg/dL — ABNORMAL HIGH (ref 70–99)
Potassium: 4.2 mmol/L (ref 3.5–5.2)
Sodium: 141 mmol/L (ref 134–144)
eGFR: 38 mL/min/1.73 — ABNORMAL LOW

## 2023-01-12 NOTE — Progress Notes (Unsigned)
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 male with the above problem list.  He was last seen 12/30/22 after an admission in Dec for Flu A pneumonia c/b AFib. He was noted to be in NSR in the AF clinic 12/08/22. But he was back in AFib on 1/24. His rate was controlled. He noted symptoms of shortness of breath and chest pain.  I felt he was volume overloaded. I started him on Lasix 40 mg. NT Pro BNP was >1500. I set him up for a Zio monitor to assess AFib burden. He returns for f/u on HFpEF and AFib.  He is here with his wife.  He brings in a list of his blood pressures.  Several of these have been above target in the last several days (as high as 156/86).  His weight is down 5 pounds.  His breathing is improved.  He has not had further chest pain.  He has not had orthopnea.  Unfortunately, he continues to have lower extremity edema.     Reviewed and updated this encounter:  Tobacco  Allergies  Meds  Problems  Med Hx  Surg Hx  Fam Hx     ROS  Labs/Other Test Reviewed:   Recent Labs: 11/26/2022: B Natriuretic  Peptide 303.1 11/27/2022: ALT 14; TSH 3.750 11/29/2022: Magnesium 2.0 12/30/2022: Hemoglobin 12.7; NT-Pro BNP 1,577; Platelets 271 01/07/2023: BUN 28; Creatinine, Ser 1.75; Potassium 4.2; Sodium 141   Recent Lipid Panel Recent Labs    11/27/22 0109  CHOL 109  TRIG 77  HDL 29*  VLDL 15  LDLCALC 65     Risk Assessment/Calculations/Metrics:    CHA2DS2-VASc Score = 5   This indicates a 7.2% annual risk of stroke. The patient's score is based upon: CHF History: 0 HTN History: 1 Diabetes History: 1 Stroke History: 0 Vascular Disease History: 1 Age Score: 2 Gender Score: 0            Physical Exam:   VS:  BP 132/64   Pulse 64   Ht 6\' 2"  (1.88 m)   Wt 244 lb 6.4 oz (110.9 kg)   SpO2 97%   BMI 31.38 kg/m    Wt Readings from Last 3 Encounters:  01/13/23 244 lb 6.4 oz (110.9 kg)  12/30/22 249 lb 9.6 oz (113.2 kg)  12/08/22 253 lb 6.4 oz (114.9 kg)    Constitutional:      Appearance: Healthy appearance. Not in distress.  Neck:     Vascular: No JVR. JVD normal.  Pulmonary:     Breath sounds: Normal breath sounds.  No wheezing. No rales.  Cardiovascular:     Normal rate. Regular rhythm. Normal S1. Normal S2.      Murmurs: There is no murmur.  Edema:    Peripheral edema present.    Pretibial: bilateral 1+ edema of the pretibial area.    Ankle: 1+ edema of the left ankle and 2+ edema of the right ankle. Abdominal:     Palpations: Abdomen is soft. There is no hepatomegaly.  Skin:    General: Skin is warm and dry.         ASSESSMENT & PLAN:   (HFpEF) heart failure with preserved ejection fraction (Timothy [name]) He notes improvement with his breathing since starting on furosemide.  He also has not had further chest pain.  Volume status appears improved on exam today as well.  His weight is down 5 pounds.  He has not had significant diuresis with Lasix.  He still has some leg edema.  We discussed potentially switching to torsemide if his edema continues.  For now, I have  recommended he use compression hose.  Continue furosemide 40 mg daily.  Based upon his renal function, his dose of metformin should be reduced.  He would be a good candidate for SGLT2 inhibitor.  He sees his primary care provider next week for follow-up on his diabetes.  I have asked him to discuss switching metformin to an SGLT2 inhibitor at that time.  I have asked him to contact me in the next couple of weeks if his swelling does not improve.  Obtain follow-up BMET today.  Follow-up with me in 3 months and Dr. Angelena [name] in 6 months.  PAF (paroxysmal atrial fibrillation) (HCC) Maintaining sinus rhythm on exam today.  Results of his 14-day ZIO are pending.  If his monitor demonstrates significant A-fib burden, consider adding amiodarone to reduce episodes of atrial fibrillation.  Based upon his renal function and age, continue Eliquis 2.5 mg twice daily.  Continue metoprolol tartrate 100 mg twice daily.  Keep A-fib clinic appointment in April for now.  If needed, we can cancel this (based upon monitor results).  Follow-up in 3 months with me and 6 months with Dr. Angelena [name].  Essential hypertension He has had some borderline blood pressures at home.  Repeat blood pressure by me today was normal.  He was taken off of amlodipine when his metoprolol was increased during his hospitalization.  Continue to monitor blood pressure for now.  Continue metoprolol tartrate 100 mg twice daily.  Diabetes (Timothy [name]) Managed by primary care.  GFR is 38.  Max dose of metformin with GFR 30-45 should be 500 mg twice daily.  Given his (HFpEF) heart failure with preserved ejection fraction, he would be a good candidate for SGLT2 inhibitor.  He sees his primary care provider soon.  I have asked him to discuss switching metformin to [place] or Farxiga at that visit.  Coronary artery disease involving native coronary artery of native heart without angina pectoris History of non-STEMI in 2001 treated with a BMS to the RCA and 2003  treated with Cypher DES to the LCx.  Myoview in 2011 was negative for ischemia.  With diuresis, his chest pain has resolved.  No further testing is indicated at this time.  Continue pravastatin 40 mg daily.  Stage 3b chronic kidney disease (CKD) (HCC) - baseline SCr 1.7-1.9 Obtain follow-up BMET today.              Dispo:  Return in about 3 months (around 04/13/2023) for Routine Follow  Up w/ Richardson Dopp, PA-C and Dr. Angelena [name] in 6 mos.   Signed, Richardson Dopp, PA-C  01/13/2023 8:56 AM    [facility] [place], [place], [place]  [address] Phone: [phone]; Fax: [phone]

## 2023-01-13 ENCOUNTER — Encounter: Payer: Self-pay | Admitting: Physician Assistant

## 2023-01-13 ENCOUNTER — Ambulatory Visit: Payer: Medicare Other | Attending: Physician Assistant | Admitting: Physician Assistant

## 2023-01-13 VITALS — BP 132/64 | HR 64 | Ht 74.0 in | Wt 244.4 lb

## 2023-01-13 DIAGNOSIS — N1832 Chronic kidney disease, stage 3b: Secondary | ICD-10-CM

## 2023-01-13 DIAGNOSIS — I10 Essential (primary) hypertension: Secondary | ICD-10-CM | POA: Diagnosis not present

## 2023-01-13 DIAGNOSIS — I5032 Chronic diastolic (congestive) heart failure: Secondary | ICD-10-CM | POA: Diagnosis not present

## 2023-01-13 DIAGNOSIS — E1122 Type 2 diabetes mellitus with diabetic chronic kidney disease: Secondary | ICD-10-CM

## 2023-01-13 DIAGNOSIS — I2510 Atherosclerotic heart disease of native coronary artery without angina pectoris: Secondary | ICD-10-CM

## 2023-01-13 DIAGNOSIS — I480 Paroxysmal atrial fibrillation: Secondary | ICD-10-CM | POA: Diagnosis not present

## 2023-01-13 DIAGNOSIS — Z794 Long term (current) use of insulin: Secondary | ICD-10-CM

## 2023-01-13 LAB — BASIC METABOLIC PANEL
BUN/Creatinine Ratio: 15 (ref 10–24)
BUN: 27 mg/dL (ref 8–27)
CO2: 27 mmol/L (ref 20–29)
Calcium: 9.3 mg/dL (ref 8.6–10.2)
Chloride: 99 mmol/L (ref 96–106)
Creatinine, Ser: 1.81 mg/dL — ABNORMAL HIGH (ref 0.76–1.27)
Glucose: 208 mg/dL — ABNORMAL HIGH (ref 70–99)
Potassium: 4.3 mmol/L (ref 3.5–5.2)
Sodium: 143 mmol/L (ref 134–144)
eGFR: 37 mL/min/{1.73_m2} — ABNORMAL LOW (ref >59)

## 2023-01-13 NOTE — Assessment & Plan Note (Signed)
He has had some borderline blood pressures at home.  Repeat blood pressure by me today was normal.  He was taken off of amlodipine when his metoprolol was increased during his hospitalization.  Continue to monitor blood pressure for now.  Continue metoprolol tartrate 100 mg twice daily.

## 2023-01-13 NOTE — Assessment & Plan Note (Signed)
Obtain follow-up BMET today. 

## 2023-01-13 NOTE — Assessment & Plan Note (Signed)
Managed by primary care.  GFR is 38.  Max dose of metformin with GFR 30-45 should be 500 mg twice daily.  Given his (HFpEF) heart failure with preserved ejection fraction, he would be a good candidate for SGLT2 inhibitor.  He sees his primary care provider soon.  I have asked him to discuss switching metformin to [place] or Farxiga at that visit.

## 2023-01-13 NOTE — Assessment & Plan Note (Addendum)
He notes improvement with his breathing since starting on furosemide.  He also has not had further chest pain.  Volume status appears improved on exam today as well.  His weight is down 5 pounds.  He has not had significant diuresis with Lasix.  He still has some leg edema.  We discussed potentially switching to torsemide if his edema continues.  For now, I have recommended he use compression hose.  Continue furosemide 40 mg daily.  Based upon his renal function, his dose of metformin should be reduced.  He would be a good candidate for SGLT2 inhibitor.  He sees his primary care provider next week for follow-up on his diabetes.  I have asked him to discuss switching metformin to an SGLT2 inhibitor at that time.  I have asked him to contact me in the next couple of weeks if his swelling does not improve.  Obtain follow-up BMET today.  Follow-up with me in 3 months and Dr. Angelena Form in 6 months.

## 2023-01-13 NOTE — Assessment & Plan Note (Signed)
Maintaining sinus rhythm on exam today.  Results of his 14-day ZIO are pending.  If his monitor demonstrates significant A-fib burden, consider adding amiodarone to reduce episodes of atrial fibrillation.  Based upon his renal function and age, continue Eliquis 2.5 mg twice daily.  Continue metoprolol tartrate 100 mg twice daily.  Keep A-fib clinic appointment in April for now.  If needed, we can cancel this (based upon monitor results).  Follow-up in 3 months with me and 6 months with Dr. Angelena Form.

## 2023-01-13 NOTE — Patient Instructions (Signed)
Medication Instructions:  Your physician recommends that you continue on your current medications as directed. Please refer to the Current Medication list given to you today.  *If you need a refill on your cardiac medications before your next appointment, please call your pharmacy*   Lab Work: TODAY:  BMET  If you have labs (blood work) drawn today and your tests are completely normal, you will receive your results only by: [place] (if you have MyChart) OR A paper copy in the mail If you have any lab test that is abnormal or we need to change your treatment, we will call you to review the results.   Testing/Procedures: None ordered   Follow-Up: At [facility], you and your health needs are our priority.  As part of our continuing mission to provide you with exceptional heart care, we have created designated Provider Care Teams.  These Care Teams include your primary Cardiologist (physician) and Advanced Practice Providers (APPs -  Physician Assistants and Nurse Practitioners) who all work together to provide you with the care you need, when you need it.  We recommend signing up for the patient portal called "MyChart".  Sign up information is provided on this After Visit Summary.  MyChart is used to connect with patients for Virtual Visits (Telemedicine).  Patients are able to view lab/test results, encounter notes, upcoming appointments, etc.  Non-urgent messages can be sent to your provider as well.   To learn more about what you can do with MyChart, go to [email or web address].    Your next appointment:   3 month(s) with Nicki Reaper and 6 months with Dr. Angelena Form  Provider:   Lauree Chandler, MD  or Richardson Dopp, PA-C         Other Instructions Get some compression stockings to wear.  If the swelling doesn't improve in a couple of weeks, please call and let us know.

## 2023-01-13 NOTE — Assessment & Plan Note (Signed)
History of non-STEMI in 2001 treated with a BMS to the RCA and 2003 treated with Cypher DES to the LCx.  Myoview in 2011 was negative for ischemia.  With diuresis, his chest pain has resolved.  No further testing is indicated at this time.  Continue pravastatin 40 mg daily.

## 2023-01-14 ENCOUNTER — Telehealth: Payer: Self-pay | Admitting: *Deleted

## 2023-01-14 ENCOUNTER — Encounter (HOSPITAL_COMMUNITY): Payer: Self-pay | Admitting: *Deleted

## 2023-01-14 DIAGNOSIS — Z79899 Other long term (current) drug therapy: Secondary | ICD-10-CM

## 2023-01-14 NOTE — Telephone Encounter (Signed)
-----   Message from Liliane Shi, [place] sent at 01/13/2023  6:03 PM EST ----- See message sent to pt via MyChart PLAN: Repeat BMET 2 weeks  Mr. Hurlock  Your Creatinine is fairly stable. Your potassium is normal. Continue current medications/treatment plan and follow up as scheduled. I will check your kidney function again in 2 weeks to keep a close eye on this.  Richardson Dopp, PA-C    01/13/2023 6:02 PM

## 2023-01-21 ENCOUNTER — Other Ambulatory Visit: Payer: Self-pay | Admitting: Cardiovascular Disease

## 2023-01-26 DIAGNOSIS — I480 Paroxysmal atrial fibrillation: Secondary | ICD-10-CM | POA: Diagnosis not present

## 2023-01-29 ENCOUNTER — Ambulatory Visit: Payer: Medicare Other | Admitting: Podiatry

## 2023-01-29 ENCOUNTER — Ambulatory Visit: Payer: Medicare Other | Attending: Physician Assistant

## 2023-01-29 VITALS — BP 152/71

## 2023-01-29 DIAGNOSIS — Z79899 Other long term (current) drug therapy: Secondary | ICD-10-CM

## 2023-01-29 DIAGNOSIS — M79675 Pain in left toe(s): Secondary | ICD-10-CM

## 2023-01-29 DIAGNOSIS — B351 Tinea unguium: Secondary | ICD-10-CM

## 2023-01-29 DIAGNOSIS — L84 Corns and callosities: Secondary | ICD-10-CM | POA: Diagnosis not present

## 2023-01-29 DIAGNOSIS — E1151 Type 2 diabetes mellitus with diabetic peripheral angiopathy without gangrene: Secondary | ICD-10-CM | POA: Diagnosis not present

## 2023-01-29 DIAGNOSIS — Q828 Other specified congenital malformations of skin: Secondary | ICD-10-CM

## 2023-01-29 DIAGNOSIS — M79674 Pain in right toe(s): Secondary | ICD-10-CM | POA: Diagnosis not present

## 2023-01-29 LAB — BASIC METABOLIC PANEL
BUN/Creatinine Ratio: 16 (ref 10–24)
BUN: 28 mg/dL — ABNORMAL HIGH (ref 8–27)
CO2: 28 mmol/L (ref 20–29)
Calcium: 9.2 mg/dL (ref 8.6–10.2)
Chloride: 100 mmol/L (ref 96–106)
Creatinine, Ser: 1.71 mg/dL — ABNORMAL HIGH (ref 0.76–1.27)
Glucose: 121 mg/dL — ABNORMAL HIGH (ref 70–99)
Potassium: 4.7 mmol/L (ref 3.5–5.2)
Sodium: 140 mmol/L (ref 134–144)
eGFR: 39 mL/min/{1.73_m2} — ABNORMAL LOW (ref >59)

## 2023-01-29 NOTE — Progress Notes (Unsigned)
  Subjective:  Patient ID: [name], male    DOB: 13-Dec-1940,  MRN: [id number]  [name] presents to clinic today for {jgcomplaint:23593}  Chief Complaint  Patient presents with   Nail Problem    [facility] [id number] A1C-6.7 PCP-[facility] PCP VST-01/20/2023   New problem(s): None. {jgcomplaint:23593}  PCP is Leonard Downing, MD.  No Known Allergies  Review of Systems: Negative except as noted in the HPI.  Objective: No changes noted in today's physical examination. There were no vitals filed for this visit. ANTRELL ABRAHA is a pleasant 82 y.o. male {jgbodyhabitus:24098} AAO x 3.  Vascular Examination: CFT <3 seconds b/l. DP pulses palpable b/l. PT pulses nonpalpable b/l. Digital hair absent. Skin temperature gradient warm to warm b/l. No pain with calf compression. No ischemia or gangrene. No cyanosis or clubbing noted b/l. +1 pitting edema bilateral ankles.   Neurological Examination: Sensation grossly intact b/l with 10 gram monofilament. Vibratory sensation intact b/l.   Dermatological Examination: Pedal skin warm and supple b/l. Toenails 1-5 b/l thick, discolored, elongated with subungual debris and pain on dorsal palpation.    Hyperkeratotic lesion(s) submet head 5 left foot.  No erythema, no edema, no drainage, no fluctuance.   Musculoskeletal Examination: Muscle strength 5/5 to b/l LE. 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  Assessment/Plan: No diagnosis found.  No orders of the defined types were placed in this encounter.   None {Jgplan:23602::"-Patient/POA to call should there be question/concern in the interim."}   No follow-ups on file.  Marzetta Board, DPM

## 2023-01-31 ENCOUNTER — Encounter: Payer: Self-pay | Admitting: Podiatry

## 2023-02-03 ENCOUNTER — Ambulatory Visit (HOSPITAL_COMMUNITY)
Admission: RE | Admit: 2023-02-03 | Discharge: 2023-02-03 | Disposition: A | Discharge status: Home / Self Care | Payer: Medicare Other | Source: Ambulatory Visit | Attending: Physician Assistant | Admitting: Physician Assistant

## 2023-02-03 ENCOUNTER — Telehealth: Payer: Self-pay | Admitting: Physician Assistant

## 2023-02-03 ENCOUNTER — Encounter (HOSPITAL_COMMUNITY): Payer: Self-pay | Admitting: Physician Assistant

## 2023-02-03 VITALS — BP 140/76 | HR 59 | Ht 74.0 in | Wt 243.6 lb

## 2023-02-03 DIAGNOSIS — E669 Obesity, unspecified: Secondary | ICD-10-CM | POA: Insufficient documentation

## 2023-02-03 DIAGNOSIS — E785 Hyperlipidemia, unspecified: Secondary | ICD-10-CM | POA: Diagnosis not present

## 2023-02-03 DIAGNOSIS — E119 Type 2 diabetes mellitus without complications: Secondary | ICD-10-CM | POA: Insufficient documentation

## 2023-02-03 DIAGNOSIS — I5030 Unspecified diastolic (congestive) heart failure: Secondary | ICD-10-CM | POA: Diagnosis not present

## 2023-02-03 DIAGNOSIS — D6869 Other thrombophilia: Secondary | ICD-10-CM | POA: Diagnosis not present

## 2023-02-03 DIAGNOSIS — I110 Hypertensive heart disease with heart failure: Secondary | ICD-10-CM | POA: Diagnosis not present

## 2023-02-03 DIAGNOSIS — N183 Chronic kidney disease, stage 3 unspecified: Secondary | ICD-10-CM | POA: Diagnosis not present

## 2023-02-03 DIAGNOSIS — I10 Essential (primary) hypertension: Secondary | ICD-10-CM | POA: Diagnosis not present

## 2023-02-03 DIAGNOSIS — I480 Paroxysmal atrial fibrillation: Secondary | ICD-10-CM | POA: Insufficient documentation

## 2023-02-03 DIAGNOSIS — I129 Hypertensive chronic kidney disease with stage 1 through stage 4 chronic kidney disease, or unspecified chronic kidney disease: Secondary | ICD-10-CM | POA: Diagnosis not present

## 2023-02-03 DIAGNOSIS — I4892 Unspecified atrial flutter: Secondary | ICD-10-CM | POA: Diagnosis not present

## 2023-02-03 DIAGNOSIS — Z6831 Body mass index (BMI) 31.0-31.9, adult: Secondary | ICD-10-CM | POA: Diagnosis not present

## 2023-02-03 NOTE — Telephone Encounter (Signed)
Patient returning call for lab results. 

## 2023-02-03 NOTE — Progress Notes (Signed)
Primary Care Physician: Leonard Downing, MD Primary Cardiologist: Dr Angelena Form  Primary Electrophysiologist: none Referring Physician: Dr Baruch Gouty is a 82 y.o. male with a history of HTN, type 2 DM, HLD, CKD III, CAD s/p bare-metal stent in the RCA in 2001 and Cypher DES in the circumflex in 2003, provoked PE 07/2013, diverticular bleeding s/p right hemicolectomy 07/2013, atrial flutter, atrial fibrillation who presents for follow up in the [facility].  The patient was initially diagnosed with atrial fibrillation and atrial flutter 11/26/22 after presenting to the ED with symptoms of SOB and a cough. He was diagnosed with influenza and incidentally found to be in atrial flutter. Per hospital notes, he also had afib on telemetry. His metoprolol was increased and he was started on Eliquis for a CHADS2VASC score of 5. Patient purchased a Kardia mobile device which showed afib for several days after leaving the hospital but he converted to SR 2-3 days prior to his follow up on 12/08/22. He denies significant snoring or alcohol use.   However, patient was back in afib at follow up on 12/30/22 with symptoms of fluid overload. A Zio monitor was placed which showed 37% afib burden, avg HR 72 bpm.   On follow up today, patient reports that he has done well since his last visit. He has seen only SR on his [place] mobile. He denies any current symptoms of fluid overload. No bleeding issues on anticoagulation.   Today, he denies symptoms of palpitations, chest pain, shortness of breath, orthopnea, PND, lower extremity edema, dizziness, presyncope, syncope, snoring, daytime somnolence, bleeding, or neurologic sequela. The patient is tolerating medications without difficulties and is otherwise without complaint today.    Atrial Fibrillation Risk Factors:  he does not have symptoms or diagnosis of sleep apnea. he does not have a history of rheumatic fever. he  does not have a history of alcohol use. The patient does not have a history of early familial atrial fibrillation or other arrhythmias.  he has a BMI of Body mass index is 31.28 kg/m.Marland Kitchen Filed Weights   02/03/23 0913  Weight: 110.5 kg    Family History  Problem Relation Age of Onset   Cancer Brother        colon cancer     Atrial Fibrillation Management history:  Previous antiarrhythmic drugs: none Previous cardioversions: none Previous ablations: none CHADS2VASC score: 5 Anticoagulation history: Eliquis   Past Medical History:  Diagnosis Date   Acute lower GI bleeding 07/01/2013   Bilateral pulmonary embolism ([place]) 07/20/2013   CAD (coronary artery disease)    Coronary artery disease    NSTEMI 2001, 2003    Diabetes mellitus    Hemorrhagic shock ([place]) 07/05/2013   History of MI (myocardial infarction)    Hyperlipidemia    Hypertension    Inguinal hernia    Respiratory failure, post-operative ([place]) 07/07/2013   Syncope, carotid sinus    Past Surgical History:  Procedure Laterality Date   APPENDECTOMY     CARDIAC CATHETERIZATION  00,03   stents both times   COLON SURGERY     COLONOSCOPY     COLONOSCOPY N/A 07/06/2013   Procedure: COLONOSCOPY;  Surgeon: Winfield Cunas., MD;  Location: [facility] ENDOSCOPY;  Service: Endoscopy;  Laterality: N/A;  Tattoo,clips,bicap available   ESOPHAGOGASTRODUODENOSCOPY N/A 07/06/2013   Procedure: ESOPHAGOGASTRODUODENOSCOPY (EGD);  Surgeon: Winfield Cunas., MD;  Location: [facility] ENDOSCOPY;  Service: Endoscopy;  Laterality: N/A;  at  bedside   EYE SURGERY  2013   Cataracts-both   INGUINAL HERNIA REPAIR Right 06/29/2013   Procedure: HERNIA REPAIR INGUINAL ADULT;  Surgeon: Earnstine Regal, MD;  Location: [place];  Service: General;  Laterality: Right;   INSERTION OF MESH Right 06/29/2013   Procedure: INSERTION OF MESH;  Surgeon: Earnstine Regal, MD;  Location: [place];  Service: General;  Laterality: Right;    LAPAROTOMY Right 07/06/2013   Procedure: EXPLORATORY LAPAROTOMY;  Surgeon: Harl Bowie, MD;  Location: [place];  Service: General;  Laterality: Right;   None     ROTATOR CUFF REPAIR  2013   lt    Current Outpatient Medications  Medication Sig Dispense Refill   apixaban (ELIQUIS) 2.5 MG TABS tablet Take 1 tablet (2.5 mg total) by mouth 2 (two) times daily. 60 tablet 11   aspirin EC 81 MG tablet Take 1 tablet (81 mg total) by mouth daily. Swallow whole. 150 tablet 2   furosemide (LASIX) 40 MG tablet Take 1 tablet (40 mg total) by mouth daily. 90 tablet 3   insulin NPH-regular Human (NOVOLIN 70/30) (70-30) 100 UNIT/ML injection Patient injects 35 units in the morning and 38 units in the evening     loratadine (CLARITIN) 10 MG tablet Take 10 mg by mouth daily as needed for allergies.     metFORMIN (GLUCOPHAGE) 1000 MG tablet Take 1,000 mg by mouth 2 (two) times daily with a meal.     metoprolol tartrate (LOPRESSOR) 100 MG tablet Take 1 tablet (100 mg total) by mouth 2 (two) times daily. 60 tablet 3   ONETOUCH ULTRA test strip 1 each daily.     pravastatin (PRAVACHOL) 40 MG tablet TAKE 1 TABLET BY MOUTH IN THE  EVENING 90 tablet 3   RELION INSULIN SYRINGE 31G X 15/64" 1 ML MISC USE 1 TWICE DAILY TO INJECT A MAX OF 85 UNITS LABS IN APRIL     traMADol (ULTRAM) 50 MG tablet Take 50 mg by mouth as needed for moderate pain or severe pain.     No current facility-administered medications for this encounter.    No Known Allergies  Social History   Socioeconomic History   Marital status: Married    Spouse name: Not on file   Number of children: Not on file   Years of education: Not on file   Highest education level: Not on file  Occupational History   Not on file  Tobacco Use   Smoking status: Never   Smokeless tobacco: Never   Tobacco comments:    Never smoke 12/08/22  Substance and Sexual Activity   Alcohol use: No   Drug use: No   Sexual activity: Not on file  Other Topics  Concern   Not on file  Social History Narrative   Not on file   Social Determinants of Health   Financial Resource Strain: Not on file  Food Insecurity: No Food Insecurity (11/26/2022)   Hunger Vital Sign    Worried About Running Out of Food in the Last Year: Never true    Ran Out of Food in the Last Year: Never true  Transportation Needs: No Transportation Needs (11/26/2022)   PRAPARE - Hydrologist (Medical): No    Lack of Transportation (Non-Medical): No  Physical Activity: Not on file  Stress: Not on file  Social Connections: Not on file  Intimate Partner Violence: Not At Risk (11/26/2022)   Humiliation, Afraid, Rape,  and Kick questionnaire    Fear of Current or Ex-Partner: No    Emotionally Abused: No    Physically Abused: No    Sexually Abused: No     ROS- All systems are reviewed and negative except as per the HPI above.  Physical Exam: Vitals:   02/03/23 0913  BP: (!) 140/76  Pulse: (!) 59  Weight: 110.5 kg  Height: '6\' 2"'$  (1.88 m)     GEN- The patient is a well appearing elderly male, alert and oriented x 3 today.   HEENT-head normocephalic, atraumatic, sclera clear, conjunctiva pink, hearing intact, trachea midline. Lungs- Clear to ausculation bilaterally, normal work of breathing Heart- Regular rate and rhythm, no murmurs, rubs or gallops  GI- soft, NT, ND, + BS Extremities- no clubbing, cyanosis, 1+ bilateral edema R>L MS- no significant deformity or atrophy Skin- no rash or lesion Psych- euthymic mood, full affect Neuro- strength and sensation are intact   Wt Readings from Last 3 Encounters:  02/03/23 110.5 kg  01/13/23 110.9 kg  12/30/22 113.2 kg    EKG today demonstrates  SB, slow R wave prog Vent. rate 59 BPM PR interval 178 ms QRS duration 120 ms QT/QTcB 416/411 ms  Echo 11/27/22 demonstrated   1. Left ventricular ejection fraction, by estimation, is 55 to 60%. The  left ventricle has normal function. The  left ventricle has no regional  wall motion abnormalities. There is mild concentric left ventricular  hypertrophy. Left ventricular diastolic parameters are indeterminate.   2. Right ventricular systolic function is normal. The right ventricular  size is normal. Tricuspid regurgitation signal is inadequate for assessing  PA pressure.   3. The mitral valve is grossly normal. No evidence of mitral valve  regurgitation. No evidence of mitral stenosis.   4. The aortic valve is grossly normal. Aortic valve regurgitation is not  visualized. No aortic stenosis is present.   5. Aortic dilatation noted. 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   6. The inferior vena cava is normal in size with greater than 50%  respiratory variability, suggesting right atrial pressure of 3 mmHg.    Epic records are reviewed at length today  CHA2DS2-VASc Score = 5  The patient's score is based upon: CHF History: 0 HTN History: 1 Diabetes History: 1 Stroke History: 0 Vascular Disease History: 1 Age Score: 2 Gender Score: 0       ASSESSMENT AND PLAN: 1. Paroxysmal Atrial Fibrillation/atrial flutter The patient's CHA2DS2-VASc score is 5, indicating a 7.2% annual risk of stroke.   Zio monitor showed 37% afib burden, avg HR 72 bpm. Would avoid class IC with h/o CAD. Could consider Multaq, dofetilide, or amiodarone. Age is borderline for ablation. After discussing the the above options, patient wants to pursue watchful waiting. I did explain that afib can precipitate fluid overload/CHF and prevention may be a good strategy. Patient voices understanding. If AAD is needed, he would prefer amiodarone. He does not want to consider ablation at this time.  Continue Eliquis 5 mg BID Continue Lopressor 100 mg BID Kardia mobile for home monitoring.   2. Secondary Hypercoagulable State (ICD10:  D68.69) The patient is at  significant risk for stroke/thromboembolism based upon his CHA2DS2-VASc Score of 5.  Continue Apixaban (Eliquis).   3. Obesity Body mass index is 31.28 kg/m. Lifestyle modification was discussed and encouraged including regular physical activity and weight reduction.  4.  HTN Stable, no changes today.  5. CAD No anginal symptoms.  6. Chronic HFpEF Pro BNP 1577 on 12/30/22 We briefly discussed Alleviate HF trial. He would like to think about participating. Will forward to research team if he decides to proceed.  Fluid status appears stable today.    Follow up with Richardson Dopp as scheduled. AF clinic in 3 months, sooner if we need to start amiodarone.    [facility] [address] [place], [place] [address] [phone] 02/03/2023 9:26 AM

## 2023-02-03 NOTE — Progress Notes (Signed)
Pt has been made aware of normal result and verbalized understanding.  jw

## 2023-02-03 NOTE — Telephone Encounter (Signed)
Call was transferred to me and pt has been made aware of his lab results.  See result note.

## 2023-02-11 ENCOUNTER — Telehealth: Payer: Self-pay

## 2023-02-11 NOTE — Telephone Encounter (Signed)
   Pre-operative Risk Assessment    Patient Name: Timothy [name]  DOB: Jul 14, 1941 MRN: [id number]     Request for Surgical Clearance    Procedure:  Dental Extraction - Amount of Teeth to be Pulled:  scheduled for 1 but possibly 2   Date of Surgery:  Clearance TBD                                 Surgeon:  Dr. Marylyn Ishihara  Surgeon's Group or Practice Name:  [facility]  Phone number:  [phone] Fax number:  [phone]   Type of Clearance Requested:   - Pharmacy:  Hold Apixaban (Eliquis)     Type of Anesthesia:  Local    Additional requests/questions:    Oneal Grout   02/11/2023, 11:41 AM

## 2023-02-11 NOTE — Telephone Encounter (Signed)
   Primary Cardiologist: Lauree Chandler, MD  Chart reviewed as part of pre-operative protocol coverage. Simple dental extractions (1-2 teeth) are considered low risk procedures per guidelines and generally do not require any specific cardiac clearance. It is also generally accepted that for simple extractions and dental cleanings, there is no need to interrupt blood thinner therapy.   SBE prophylaxis is not required for the patient.  I will route this recommendation to the requesting party via Epic fax function and remove from pre-op pool.  Please call with questions.  Emmaline Life, NP-C  02/11/2023, 12:08 PM [address], [address] Office [phone] Fax [phone]

## 2023-02-28 ENCOUNTER — Other Ambulatory Visit (HOSPITAL_COMMUNITY): Payer: Self-pay | Admitting: Physician Assistant

## 2023-03-10 ENCOUNTER — Ambulatory Visit (HOSPITAL_COMMUNITY): Payer: Medicare Other | Admitting: Physician Assistant

## 2023-04-05 ENCOUNTER — Other Ambulatory Visit (HOSPITAL_COMMUNITY): Payer: Self-pay | Admitting: *Deleted

## 2023-04-05 MED ORDER — METOPROLOL TARTRATE 100 MG PO TABS: 100.0000 mg | ORAL_TABLET | Freq: Two times a day (BID) | ORAL | 2 refills | Status: DC

## 2023-04-12 NOTE — Progress Notes (Signed)
Cardiology Office Note:    Date:  04/13/2023  ID:  Timothy [name], DOB 22-Feb-1941, MRN [id number] PCP: [name], MD  [facility] Providers Cardiologist:  Verne Carrow, MD          Patient Profile:   Coronary artery disease S/p NSTEMI in 2001 tx w BMS to RCA, 2003 tx w Cypher DES to LCx Myoview in 01/2010: no ischemia, EF 55 Paroxysmal atrial fibrillation In setting of Flu A/pneumonia 12/2022 TTE 11/27/2022: EF 55-60, no RWMA, mild LVH, normal RVSF, mild dilation of aortic root (40 mm), mild dilation of ascending aorta (38 mm), RAP 3 Monitor 01/2023: AFlutter 37%; pauses (longest 4.6 sec) during sleep (HFpEF) heart failure with preserved ejection fraction  Diabetes mellitus  Hypertension  Hyperlipidemia  Chronic kidney disease  Hx of pulmonary embolism in 2014 Hx of LGI bleed (diverticular; complication of hernia repair) in 2014; s/p R partial colectomy        History of Present Illness:   Timothy [name] is a 82 y.o. male who returns for f/u of CHF, AFib. His monitor showed 37% AFlutter (AFib) burden. I had him f/u in the AF clinic. He did not want to pursue AAD therapy to control atrial fibrillation. Amiodarone was felt to be the best option if he needed/desired to start AAD therapy.  He is here alone.  Since last seen, he has been doing well.  He has not had chest discomfort, shortness of breath, syncope.  He takes his Lasix as needed for increased leg edema.  He takes it about twice a week.  Review of Systems  Gastrointestinal:  Negative for hematochezia and melena.  Genitourinary:  Negative for hematuria.   see the HPI    Studies Reviewed:    EKG: Not done  Risk Assessment/Calculations:    CHA2DS2-VASc Score = 6   This indicates a 9.7% annual risk of stroke. The patient's score is based upon: CHF History: 1 HTN History: 1 Diabetes History: 1 Stroke History: 0 Vascular Disease History: 1 Age Score: 2 Gender Score: 0            Physical  Exam:   VS:  BP (!) 138/54   Pulse 64   Ht 6\' 2"  (1.88 m)   Wt 248 lb 12.8 oz (112.9 kg)   SpO2 94%   BMI 31.94 kg/m    Wt Readings from Last 3 Encounters:  04/13/23 248 lb 12.8 oz (112.9 kg)  02/03/23 243 lb 9.6 oz (110.5 kg)  01/13/23 244 lb 6.4 oz (110.9 kg)    Constitutional:      Appearance: Healthy appearance. Not in distress.  Neck:     Vascular: JVD normal.  Pulmonary:     Breath sounds: Normal breath sounds. No rales.  Cardiovascular:     Normal rate. Regular rhythm.     Murmurs: There is no murmur.  Edema:    Ankle: bilateral trace edema of the ankle. Abdominal:     Palpations: Abdomen is soft.       ASSESSMENT AND PLAN:   (HFpEF) heart failure with preserved ejection fraction (HCC) EF 55-60 by echocardiogram in December 2023.  NYHA II.  Volume status stable.  We had discussed SGLT2 inhibitors at last visit.  His PCP has decided to keep him on metformin.  The patient takes furosemide only as needed for leg edema.  Overall, he is stable.  I recommend continuing current therapy.  Continue furosemide 40 mg as needed.  Follow-up with Dr. Clifton James  in August as planned.  PAF (paroxysmal atrial fibrillation) (HCC) Maintaining sinus rhythm by exam.  Monitor in February demonstrated 37% burden of atrial fibrillation/flutter.  He was seen in the A-fib clinic.  Decision was made to not pursue any further medication changes.  If he does have increased burden of atrial fibrillation, amiodarone would be considered.  Based upon age and renal function, continue Eliquis 2.5 mg twice daily.  In February 2024, creatinine was stable at 1.71.  In December 2023, hemoglobin was normal at 12.3.  Coronary artery disease involving native coronary artery of native heart without angina pectoris History of non-STEMI in 2001 treated with a BMS to the RCA and 2003 treated with Cypher DES to the LCx.  Myoview in 2011 was negative for ischemia.  He has not had chest discomfort to suggest angina.   Continue ASA 81 mg daily, pravastatin 40 mg daily.  Essential hypertension Fair control.  Continue metoprolol tartrate 100 mg twice daily.      Dispo:  Return in 4 months (on 08/06/2023) for Scheduled Follow Up with Dr. Clifton James.  Signed, Tereso Newcomer, PA-C

## 2023-04-13 ENCOUNTER — Encounter: Payer: Self-pay | Admitting: Physician Assistant

## 2023-04-13 ENCOUNTER — Ambulatory Visit: Payer: Medicare Other | Attending: Physician Assistant | Admitting: Physician Assistant

## 2023-04-13 VITALS — BP 138/54 | HR 64 | Ht 74.0 in | Wt 248.8 lb

## 2023-04-13 DIAGNOSIS — I10 Essential (primary) hypertension: Secondary | ICD-10-CM | POA: Diagnosis not present

## 2023-04-13 DIAGNOSIS — I5032 Chronic diastolic (congestive) heart failure: Secondary | ICD-10-CM

## 2023-04-13 DIAGNOSIS — I480 Paroxysmal atrial fibrillation: Secondary | ICD-10-CM | POA: Diagnosis not present

## 2023-04-13 DIAGNOSIS — I2510 Atherosclerotic heart disease of native coronary artery without angina pectoris: Secondary | ICD-10-CM

## 2023-04-13 NOTE — Assessment & Plan Note (Signed)
Fair control.  Continue metoprolol tartrate 100 mg twice daily. 

## 2023-04-13 NOTE — Assessment & Plan Note (Signed)
EF 55-60 by echocardiogram in December 2023.  NYHA II.  Volume status stable.  We had discussed SGLT2 inhibitors at last visit.  His PCP has decided to keep him on metformin.  The patient takes furosemide only as needed for leg edema.  Overall, he is stable.  I recommend continuing current therapy.  Continue furosemide 40 mg as needed.  Follow-up with Dr. Clifton James in August as planned.

## 2023-04-13 NOTE — Assessment & Plan Note (Signed)
History of non-STEMI in 2001 treated with a BMS to the RCA and 2003 treated with Cypher DES to the LCx.  Myoview in 2011 was negative for ischemia.  He has not had chest discomfort to suggest angina.  Continue ASA 81 mg daily, pravastatin 40 mg daily.

## 2023-04-13 NOTE — Patient Instructions (Signed)
Medication Instructions:  Your physician recommends that you continue on your current medications as directed. Please refer to the Current Medication list given to you today.  *If you need a refill on your cardiac medications before your next appointment, please call your pharmacy*   Lab Work: None ordered  If you have labs (blood work) drawn today and your tests are completely normal, you will receive your results only by: MyChart Message (if you have MyChart) OR A paper copy in the mail If you have any lab test that is abnormal or we need to change your treatment, we will call you to review the results.   Testing/Procedures: None ordered   Follow-Up: At [facility], you and your health needs are our priority.  As part of our continuing mission to provide you with exceptional heart care, we have created designated Provider Care Teams.  These Care Teams include your primary Cardiologist (physician) and Advanced Practice Providers (APPs -  Physician Assistants and Nurse Practitioners) who all work together to provide you with the care you need, when you need it.  We recommend signing up for the patient portal called "MyChart".  Sign up information is provided on this After Visit Summary.  MyChart is used to connect with patients for Virtual Visits (Telemedicine).  Patients are able to view lab/test results, encounter notes, upcoming appointments, etc.  Non-urgent messages can be sent to your provider as well.   To learn more about what you can do with MyChart, go to [email or web address].    Your next appointment:   As scheduled   Provider:   Verne Carrow, MD     Other Instructions

## 2023-04-13 NOTE — Assessment & Plan Note (Signed)
Maintaining sinus rhythm by exam.  Monitor in February demonstrated 37% burden of atrial fibrillation/flutter.  He was seen in the A-fib clinic.  Decision was made to not pursue any further medication changes.  If he does have increased burden of atrial fibrillation, amiodarone would be considered.  Based upon age and renal function, continue Eliquis 2.5 mg twice daily.  In February 2024, creatinine was stable at 1.71.  In December 2023, hemoglobin was normal at 12.3.

## 2023-05-11 ENCOUNTER — Ambulatory Visit (HOSPITAL_COMMUNITY)
Admission: RE | Admit: 2023-05-11 | Discharge: 2023-05-11 | Disposition: A | Discharge status: Home / Self Care | Payer: Medicare Other | Source: Ambulatory Visit | Attending: Physician Assistant | Admitting: Physician Assistant

## 2023-05-11 VITALS — BP 152/66 | HR 57 | Ht 74.0 in | Wt 247.2 lb

## 2023-05-11 DIAGNOSIS — I5032 Chronic diastolic (congestive) heart failure: Secondary | ICD-10-CM | POA: Diagnosis not present

## 2023-05-11 DIAGNOSIS — I2510 Atherosclerotic heart disease of native coronary artery without angina pectoris: Secondary | ICD-10-CM | POA: Diagnosis not present

## 2023-05-11 DIAGNOSIS — I480 Paroxysmal atrial fibrillation: Secondary | ICD-10-CM | POA: Diagnosis not present

## 2023-05-11 DIAGNOSIS — I4892 Unspecified atrial flutter: Secondary | ICD-10-CM | POA: Diagnosis not present

## 2023-05-11 DIAGNOSIS — I129 Hypertensive chronic kidney disease with stage 1 through stage 4 chronic kidney disease, or unspecified chronic kidney disease: Secondary | ICD-10-CM | POA: Diagnosis not present

## 2023-05-11 DIAGNOSIS — E1122 Type 2 diabetes mellitus with diabetic chronic kidney disease: Secondary | ICD-10-CM | POA: Diagnosis not present

## 2023-05-11 DIAGNOSIS — N183 Chronic kidney disease, stage 3 unspecified: Secondary | ICD-10-CM | POA: Diagnosis not present

## 2023-05-11 DIAGNOSIS — I110 Hypertensive heart disease with heart failure: Secondary | ICD-10-CM | POA: Diagnosis not present

## 2023-05-11 DIAGNOSIS — E785 Hyperlipidemia, unspecified: Secondary | ICD-10-CM | POA: Diagnosis not present

## 2023-05-11 DIAGNOSIS — D6869 Other thrombophilia: Secondary | ICD-10-CM | POA: Diagnosis not present

## 2023-05-11 DIAGNOSIS — Z7901 Long term (current) use of anticoagulants: Secondary | ICD-10-CM | POA: Diagnosis not present

## 2023-05-11 NOTE — Progress Notes (Signed)
Primary Care Physician: [name], MD Primary Cardiologist: Dr Clifton James  Primary Electrophysiologist: none Referring Physician: Dr Charline Bills is a 82 y.o. male with a history of HTN, type 2 DM, HLD, CKD III, CAD s/p bare-metal stent in the RCA in 2001 and Cypher DES in the circumflex in 2003, provoked PE 07/2013, diverticular bleeding s/p right hemicolectomy 07/2013, atrial flutter, atrial fibrillation who presents for follow up in the [facility].  The patient was initially diagnosed with atrial fibrillation and atrial flutter 11/26/22 after presenting to the ED with symptoms of SOB and a cough. He was diagnosed with influenza and incidentally found to be in atrial flutter. Per hospital notes, he also had afib on telemetry. His metoprolol was increased and he was started on Eliquis for a CHADS2VASC score of 5. Patient purchased a Kardia mobile device which showed afib for several days after leaving the hospital but he converted to SR 2-3 days prior to his follow up on 12/08/22. He denies significant snoring or alcohol use.   However, patient was back in afib at follow up on 12/30/22 with symptoms of fluid overload. A Zio monitor was placed which showed 37% afib burden, avg HR 72 bpm.   On follow up today, patient reports that he has done well since his last visit. He has a Nutritional therapist which has shown only SR. No symptoms of fluid overload. No bleeding issues on anticoagulation.   Today, he denies symptoms of palpitations, chest pain, shortness of breath, orthopnea, PND, lower extremity edema, dizziness, presyncope, syncope, snoring, daytime somnolence, bleeding, or neurologic sequela. The patient is tolerating medications without difficulties and is otherwise without complaint today.    Atrial Fibrillation Risk Factors:  he does not have symptoms or diagnosis of sleep apnea. he does not have a history of rheumatic fever. he does  not have a history of alcohol use. The patient does not have a history of early familial atrial fibrillation or other arrhythmias.  he has a BMI of Body mass index is 31.74 kg/m.Marland Kitchen Filed Weights   05/11/23 1442  Weight: 112.1 kg     Family History  Problem Relation Age of Onset   Cancer Brother        colon cancer     Atrial Fibrillation Management history:  Previous antiarrhythmic drugs: none Previous cardioversions: none Previous ablations: none Anticoagulation history: Eliquis   Past Medical History:  Diagnosis Date   Acute lower GI bleeding 07/01/2013   Bilateral pulmonary embolism (HCC) 07/20/2013   CAD (coronary artery disease)    Coronary artery disease    NSTEMI 2001, 2003    Diabetes mellitus    Hemorrhagic shock (HCC) 07/05/2013   History of MI (myocardial infarction)    Hyperlipidemia    Hypertension    Inguinal hernia    Respiratory failure, post-operative (HCC) 07/07/2013   Syncope, carotid sinus    Past Surgical History:  Procedure Laterality Date   APPENDECTOMY     CARDIAC CATHETERIZATION  00,03   stents both times   COLON SURGERY     COLONOSCOPY     COLONOSCOPY N/A 07/06/2013   Procedure: COLONOSCOPY;  Surgeon: [name], MD;  Location: [facility] ENDOSCOPY;  Service: Endoscopy;  Laterality: N/A;  Tattoo,clips,bicap available   ESOPHAGOGASTRODUODENOSCOPY N/A 07/06/2013   Procedure: ESOPHAGOGASTRODUODENOSCOPY (EGD);  Surgeon: [name], MD;  Location: [facility] ENDOSCOPY;  Service: Endoscopy;  Laterality: N/A;  at bedside  EYE SURGERY  2013   Cataracts-both   INGUINAL HERNIA REPAIR Right 06/29/2013   Procedure: HERNIA REPAIR INGUINAL ADULT;  Surgeon: [name], MD;  Location: [facility];  Service: General;  Laterality: Right;   INSERTION OF MESH Right 06/29/2013   Procedure: INSERTION OF MESH;  Surgeon: [name], MD;  Location: [facility];  Service: General;  Laterality: Right;   LAPAROTOMY Right  07/06/2013   Procedure: EXPLORATORY LAPAROTOMY;  Surgeon: [name], MD;  Location: [facility] OR;  Service: General;  Laterality: Right;   None     ROTATOR CUFF REPAIR  2013   lt    Current Outpatient Medications  Medication Sig Dispense Refill   apixaban (ELIQUIS) 2.5 MG TABS tablet Take 1 tablet (2.5 mg total) by mouth 2 (two) times daily. 180 tablet 1   aspirin EC 81 MG tablet Take 1 tablet (81 mg total) by mouth daily. Swallow whole. 150 tablet 2   furosemide (LASIX) 40 MG tablet Take 40 mg by mouth as needed for fluid or edema.     insulin NPH-regular Human (NOVOLIN 70/30) (70-30) 100 UNIT/ML injection Patient injects 35 units in the morning and 38 units in the evening     loratadine (CLARITIN) 10 MG tablet Take 10 mg by mouth daily as needed for allergies.     metFORMIN (GLUCOPHAGE) 1000 MG tablet Take 1,000 mg by mouth 2 (two) times daily with a meal.     metoprolol tartrate (LOPRESSOR) 100 MG tablet Take 1 tablet (100 mg total) by mouth 2 (two) times daily. 180 tablet 2   ONETOUCH ULTRA test strip 1 each daily.     pravastatin (PRAVACHOL) 40 MG tablet TAKE 1 TABLET BY MOUTH IN THE  EVENING 90 tablet 3   RELION INSULIN SYRINGE 31G X 15/64" 1 ML MISC USE 1 TWICE DAILY TO INJECT A MAX OF 85 UNITS LABS IN APRIL     traMADol (ULTRAM) 50 MG tablet Take 50 mg by mouth as needed for moderate pain or severe pain.     No current facility-administered medications for this encounter.    No Known Allergies  Social History   Socioeconomic History   Marital status: Married    Spouse name: Not on file   Number of children: Not on file   Years of education: Not on file   Highest education level: Not on file  Occupational History   Not on file  Tobacco Use   Smoking status: Never   Smokeless tobacco: Never   Tobacco comments:    Never smoke 12/08/22  Substance and Sexual Activity   Alcohol use: No   Drug use: No   Sexual activity: Not on file  Other Topics Concern   Not on file   Social History Narrative   Not on file   Social Determinants of Health   Financial Resource Strain: Not on file  Food Insecurity: No Food Insecurity (11/26/2022)   Hunger Vital Sign    Worried About Running Out of Food in the Last Year: Never true    Ran Out of Food in the Last Year: Never true  Transportation Needs: No Transportation Needs (11/26/2022)   PRAPARE - Administrator, Civil Service (Medical): No    Lack of Transportation (Non-Medical): No  Physical Activity: Not on file  Stress: Not on file  Social Connections: Not on file  Intimate Partner Violence: Not At Risk (11/26/2022)   Humiliation, Afraid, Rape, and Kick  questionnaire    Fear of Current or Ex-Partner: No    Emotionally Abused: No    Physically Abused: No    Sexually Abused: No     ROS- All systems are reviewed and negative except as per the HPI above.  Physical Exam: Vitals:   05/11/23 1442  BP: (!) 152/66  Pulse: (!) 57  Weight: 112.1 kg  Height: 6\' 2"  (1.88 m)    GEN- The patient is a well appearing elderly male, alert and oriented x 3 today.   HEENT-head normocephalic, atraumatic, sclera clear, conjunctiva pink, hearing intact, trachea midline. Lungs- Clear to ausculation bilaterally, normal work of breathing Heart- Regular rate and rhythm, no murmurs, rubs or gallops  GI- soft, NT, ND, + BS Extremities- no clubbing, cyanosis. Trace bilateral edema. MS- no significant deformity or atrophy Skin- no rash or lesion Psych- euthymic mood, full affect Neuro- strength and sensation are intact   Wt Readings from Last 3 Encounters:  05/11/23 112.1 kg  04/13/23 112.9 kg  02/03/23 110.5 kg    EKG today demonstrates  SB, IVCD Vent. rate 57 BPM PR interval 196 ms QRS duration 120 ms QT/QTcB 430/418 ms  Echo 11/27/22 demonstrated   1. Left ventricular ejection fraction, by estimation, is 55 to 60%. The  left ventricle has normal function. The left ventricle has no regional  wall  motion abnormalities. There is mild concentric left ventricular  hypertrophy. Left ventricular diastolic parameters are indeterminate.   2. Right ventricular systolic function is normal. The right ventricular  size is normal. Tricuspid regurgitation signal is inadequate for assessing  PA pressure.   3. The mitral valve is grossly normal. No evidence of mitral valve  regurgitation. No evidence of mitral stenosis.   4. The aortic valve is grossly normal. Aortic valve regurgitation is not  visualized. No aortic stenosis is present.   5. Aortic dilatation noted. 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   6. The inferior vena cava is normal in size with greater than 50%  respiratory variability, suggesting right atrial pressure of 3 mmHg.    Epic records are reviewed at length today  CHA2DS2-VASc Score = 6  The patient's score is based upon: CHF History: 1 HTN History: 1 Diabetes History: 1 Stroke History: 0 Vascular Disease History: 1 Age Score: 2 Gender Score: 0       ASSESSMENT AND PLAN: 1. Paroxysmal Atrial Fibrillation/atrial flutter The patient's CHA2DS2-VASc score is 6, indicating a 9.7% annual risk of stroke.   Zio monitor showed 37% afib burden, avg HR 72 bpm. Would avoid class IC with h/o CAD.  Patient previously opted for watchful waiting. He has been maintaining SR on his Kardia mobile. If he has increase in his afib burden, he is agreeable to starting amiodarone.  Continue Eliquis 5 mg BID Continue Lopressor 100 mg BID Kardia mobile for home monitoring.   2. Secondary Hypercoagulable State (ICD10:  D68.69) The patient is at significant risk for stroke/thromboembolism based upon his CHA2DS2-VASc Score of 6.  Continue Apixaban (Eliquis).   3. Chronic HFpEF Fluid status appears stable. GDMT per primary cardiology team.  4. HTN Mildly elevated today, better controlled at  previous visits. Continue to monitor, no changes today.   5. CAD No anginal symptoms.   Follow up with Dr Clifton James as scheduled. AF clinic in one year.    Jorja Loa PA-C [facility] [place]  [facility] [address] [place], [place] [address] [phone] 05/11/2023 3:21 PM

## 2023-05-12 ENCOUNTER — Ambulatory Visit (HOSPITAL_COMMUNITY): Payer: Medicare Other | Admitting: Physician Assistant

## 2023-07-30 ENCOUNTER — Ambulatory Visit: Payer: Medicare Other | Admitting: Podiatry

## 2023-07-30 ENCOUNTER — Encounter: Payer: Self-pay | Admitting: Podiatry

## 2023-07-30 DIAGNOSIS — B351 Tinea unguium: Secondary | ICD-10-CM

## 2023-07-30 DIAGNOSIS — M79674 Pain in right toe(s): Secondary | ICD-10-CM

## 2023-07-30 DIAGNOSIS — M2011 Hallux valgus (acquired), right foot: Secondary | ICD-10-CM

## 2023-07-30 DIAGNOSIS — E1151 Type 2 diabetes mellitus with diabetic peripheral angiopathy without gangrene: Secondary | ICD-10-CM

## 2023-07-30 DIAGNOSIS — M2012 Hallux valgus (acquired), left foot: Secondary | ICD-10-CM

## 2023-07-30 DIAGNOSIS — M79675 Pain in left toe(s): Secondary | ICD-10-CM

## 2023-07-30 DIAGNOSIS — L84 Corns and callosities: Secondary | ICD-10-CM | POA: Diagnosis not present

## 2023-07-30 DIAGNOSIS — Q828 Other specified congenital malformations of skin: Secondary | ICD-10-CM

## 2023-07-30 NOTE — Progress Notes (Signed)
This patient returns to my office for at risk foot care.  This patient requires this care by a professional since this patient will be at risk due to having CKD. And diabetes. This patient is unable to cut nails himself since the patient cannot reach his nails.These nails are painful walking and wearing shoes.  Patient has painful callus left forefoot. This patient presents for at risk foot care today.  General Appearance  Alert, conversant and in no acute stress.  Vascular  Dorsalis pedis and posterior tibial  pulses are  weakly palpable  bilaterally.  Capillary return is within normal limits  bilaterally. Temperature is within normal limits  bilaterally.  Neurologic  Senn-Weinstein monofilament wire test within normal limits  bilaterally. Muscle power within normal limits bilaterally.  Nails Thick disfigured discolored nails with subungual debris  from hallux to fifth toes bilaterally. No evidence of bacterial infection or drainage bilaterally.  Orthopedic  No limitations of motion  feet .  No crepitus or effusions noted.  No bony pathology or digital deformities noted.Severe  HAV  B/L. Plantar flexed second metatarsal left foot.  Hammer toes  B/L.  Skin  normotropic skin with no porokeratosis noted bilaterally.  No signs of infections or ulcers noted.  Porokeratosis sub 2 left foot.   Onychomycosis  Pain in right toes  Pain in left toes  Consent was obtained for treatment procedures.   Mechanical debridement of nails 1-5  bilaterally performed with a nail nipper.  Filed with dremel without incident.    Return office visit    3 months                  Told patient to return for periodic foot care and evaluation due to potential at risk complications.   Helane Gunther DPM

## 2023-08-05 NOTE — Progress Notes (Signed)
Chief Complaint  Patient presents with   Follow-up    CAD   History of Present Illness: 82 yo male with a history of DM, HTN, HLD, coronary artery disease status post non-ST elevation myocardial infarction in 2001 and 2003, PE in 2014, chronic diastolic CHF and paroxysmal atrial fibrillation who is here today for cardiac follow up. Cardiac history includes bare-metal stent placement in the RCA in 2001 and Cypher drug-eluting stent placement in the circumflex artery in 2003. Stress myoview February 2011 with LVEF of 55%. No ischemia.  Patient underwent inguinal hernia repair in July of 2014 complicated by a diverticular bleed requiring right partial colectomy. He was admitted with pneumonia in December 2023 and was found to have atrial fibrillation. He was placed on Eliquis and Metoprolol. Echo December 2023 with LVEF=55-60%. No significant valve disease. He was started on Lasix in January 2024 with symptoms of dyspnea and chest pain with elevated BNP. Cardiac monitor with 38% burden of atrial fibrillation. He was seen in the atrial fib clinic and did not wish to start AAD therapy.   He is here today for follow up. The patient denies any chest pain, dyspnea, palpitations, lower extremity edema, orthopnea, PND, dizziness, near syncope or syncope.   He enjoys restoring older VW Beetles.   Primary Care Physician: [name], MD  Past Medical History:  Diagnosis Date   Acute lower GI bleeding 07/01/2013   Bilateral pulmonary embolism (HCC) 07/20/2013   CAD (coronary artery disease)    Coronary artery disease    NSTEMI 2001, 2003    Diabetes mellitus    Hemorrhagic shock (HCC) 07/05/2013   History of MI (myocardial infarction)    Hyperlipidemia    Hypertension    Inguinal hernia    Respiratory failure, post-operative (HCC) 07/07/2013   Syncope, carotid sinus     Past Surgical History:  Procedure Laterality Date   APPENDECTOMY     CARDIAC CATHETERIZATION  00,03   stents both  times   COLON SURGERY     COLONOSCOPY     COLONOSCOPY N/A 07/06/2013   Procedure: COLONOSCOPY;  Surgeon: [name], MD;  Location: [facility] ENDOSCOPY;  Service: Endoscopy;  Laterality: N/A;  Tattoo,clips,bicap available   ESOPHAGOGASTRODUODENOSCOPY N/A 07/06/2013   Procedure: ESOPHAGOGASTRODUODENOSCOPY (EGD);  Surgeon: [name], MD;  Location: [facility] ENDOSCOPY;  Service: Endoscopy;  Laterality: N/A;  at bedside   EYE SURGERY  2013   Cataracts-both   INGUINAL HERNIA REPAIR Right 06/29/2013   Procedure: HERNIA REPAIR INGUINAL ADULT;  Surgeon: [name], MD;  Location: [facility];  Service: General;  Laterality: Right;   INSERTION OF MESH Right 06/29/2013   Procedure: INSERTION OF MESH;  Surgeon: [name], MD;  Location: [facility];  Service: General;  Laterality: Right;   LAPAROTOMY Right 07/06/2013   Procedure: EXPLORATORY LAPAROTOMY;  Surgeon: [name], MD;  Location: [facility] OR;  Service: General;  Laterality: Right;   None     ROTATOR CUFF REPAIR  2013   lt    Current Outpatient Medications  Medication Sig Dispense Refill   apixaban (ELIQUIS) 2.5 MG TABS tablet Take 1 tablet (2.5 mg total) by mouth 2 (two) times daily. 180 tablet 1   aspirin EC 81 MG tablet Take 1 tablet (81 mg total) by mouth daily. Swallow whole. 150 tablet 2   furosemide (LASIX) 40 MG tablet Take 40 mg by mouth as needed for fluid or edema.  insulin NPH-regular Human (NOVOLIN 70/30) (70-30) 100 UNIT/ML injection Patient injects 35 units in the morning and 38 units in the evening     lisinopril (ZESTRIL) 10 MG tablet Take 10 mg by mouth daily.     loratadine (CLARITIN) 10 MG tablet Take 10 mg by mouth daily as needed for allergies.     metFORMIN (GLUCOPHAGE) 500 MG tablet Take 500 mg by mouth 2 (two) times daily with a meal.     metoprolol tartrate (LOPRESSOR) 100 MG tablet Take 50 mg by mouth 2 (two) times daily.     ONETOUCH ULTRA test strip 1 each daily.      pravastatin (PRAVACHOL) 40 MG tablet TAKE 1 TABLET BY MOUTH IN THE  EVENING 90 tablet 3   RELION INSULIN SYRINGE 31G X 15/64" 1 ML MISC USE 1 TWICE DAILY TO INJECT A MAX OF 85 UNITS LABS IN APRIL     traMADol (ULTRAM) 50 MG tablet Take 50 mg by mouth as needed for moderate pain or severe pain.     No current facility-administered medications for this visit.    No Known Allergies  Social History   Socioeconomic History   Marital status: Married    Spouse name: Not on file   Number of children: Not on file   Years of education: Not on file   Highest education level: Not on file  Occupational History   Not on file  Tobacco Use   Smoking status: Never   Smokeless tobacco: Never   Tobacco comments:    Never smoke 12/08/22  Substance and Sexual Activity   Alcohol use: No   Drug use: No   Sexual activity: Not on file  Other Topics Concern   Not on file  Social History Narrative   Not on file   Social Determinants of Health   Financial Resource Strain: Not on file  Food Insecurity: No Food Insecurity (11/26/2022)   Hunger Vital Sign    Worried About Running Out of Food in the Last Year: Never true    Ran Out of Food in the Last Year: Never true  Transportation Needs: No Transportation Needs (11/26/2022)   PRAPARE - Administrator, Civil Service (Medical): No    Lack of Transportation (Non-Medical): No  Physical Activity: Not on file  Stress: Not on file  Social Connections: Not on file  Intimate Partner Violence: Not At Risk (11/26/2022)   Humiliation, Afraid, Rape, and Kick questionnaire    Fear of Current or Ex-Partner: No    Emotionally Abused: No    Physically Abused: No    Sexually Abused: No    Family History  Problem Relation Age of Onset   Cancer Brother        colon cancer    Review of Systems:  As stated in the HPI and otherwise negative.   BP 124/72   Pulse 63   Ht 6\' 2"  (1.88 m)   Wt 114.7 kg   SpO2 97%   BMI 32.46 kg/m   Physical  Examination: General: Well developed, well nourished, NAD  HEENT: OP clear, mucus membranes moist  SKIN: warm, dry. No rashes. Neuro: No focal deficits  Musculoskeletal: Muscle strength 5/5 all ext  Psychiatric: Mood and affect normal  Neck: No JVD, no carotid bruits, no thyromegaly, no lymphadenopathy.  Lungs:Clear bilaterally, no wheezes, rhonci, crackles Cardiovascular: Regular rate and rhythm. No murmurs, gallops or rubs. Abdomen:Soft. Bowel sounds present. Non-tender.  Extremities: No lower extremity edema. Pulses are  2 + in the bilateral DP/PT.  Echo December 2023:  1. Left ventricular ejection fraction, by estimation, is 55 to 60%. The  left ventricle has normal function. The left ventricle has no regional  wall motion abnormalities. There is mild concentric left ventricular  hypertrophy. Left ventricular diastolic  parameters are indeterminate.   2. Right ventricular systolic function is normal. The right ventricular  size is normal. Tricuspid regurgitation signal is inadequate for assessing  PA pressure.   3. The mitral valve is grossly normal. No evidence of mitral valve  regurgitation. No evidence of mitral stenosis.   4. The aortic valve is grossly normal. Aortic valve regurgitation is not  visualized. No aortic stenosis is present.   5. Aortic dilatation noted. 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   6. The inferior vena cava is normal in size with greater than 50%  respiratory variability, suggesting right atrial pressure of 3 mmHg.   EKG:  EKG is not ordered today. The ekg ordered today demonstrates   Recent Labs: 11/26/2022: B Natriuretic Peptide 303.1 11/27/2022: ALT 14; TSH 3.750 11/29/2022: Magnesium 2.0 12/30/2022: Hemoglobin 12.7; NT-Pro BNP 1,577; Platelets 271 01/29/2023: BUN 28; Creatinine, Ser 1.71; Potassium 4.7; Sodium 140   Lipid Panel Followed in primary  care   Wt Readings from Last 3 Encounters:  08/06/23 114.7 kg  05/11/23 112.1 kg  04/13/23 112.9 kg    Assessment and Plan:   1. CAD without angina: No chest pain suggestive of angina. Continue ASA, statin and beta blocker.     2. HYPERTENSION: BP is well controlled. No changes today  3. Hyperlipidemia: Lipids followed in primary care. LDL 65 in December 2023. Continue statin.   4. Chronic diastolic CHF: Weight is up 6 lbs over the past 3 months. He has not taken Lasix in two months. LVEF normal by echo in December 2023. Continue Lasix 40 mg as needed but will take every day for the next three days.   5. Paroxysmal atrial fibrillation: Sinus today. CHADS VASC score 5. Continue metoprolol and Eliquis.   Labs/ tests ordered today include:  No orders of the defined types were placed in this encounter.  Disposition:   F/U with me in 12  months  Signed, Verne Carrow, MD 08/06/2023 8:26 AM    [facility] [address] [place], [place], [place]  [address] Phone: [phone]; Fax: [phone]

## 2023-08-06 ENCOUNTER — Encounter: Payer: Self-pay | Admitting: Cardiovascular Disease

## 2023-08-06 ENCOUNTER — Ambulatory Visit: Payer: Medicare Other | Attending: Cardiovascular Disease | Admitting: Cardiovascular Disease

## 2023-08-06 VITALS — BP 124/72 | HR 63 | Ht 74.0 in | Wt 252.8 lb

## 2023-08-06 DIAGNOSIS — I2510 Atherosclerotic heart disease of native coronary artery without angina pectoris: Secondary | ICD-10-CM

## 2023-08-06 DIAGNOSIS — I10 Essential (primary) hypertension: Secondary | ICD-10-CM

## 2023-08-06 DIAGNOSIS — I480 Paroxysmal atrial fibrillation: Secondary | ICD-10-CM | POA: Diagnosis not present

## 2023-08-06 NOTE — Patient Instructions (Signed)
Medication Instructions:  Your physician recommends that you continue on your current medications as directed. Please refer to the Current Medication list given to you today.  *If you need a refill on your cardiac medications before your next appointment, please call your pharmacy*  Lab Work: None ordered today  Testing/Procedures: None ordered today  Follow-Up: At [facility], you and your health needs are our priority.  As part of our continuing mission to provide you with exceptional heart care, we have created designated Provider Care Teams.  These Care Teams include your primary Cardiologist (physician) and Advanced Practice Providers (APPs -  Physician Assistants and Nurse Practitioners) who all work together to provide you with the care you need, when you need it.  Your next appointment:   6 month(s)  The format for your next appointment:   In Person  Provider:   Lauree Chandler, MD

## 2023-09-04 ENCOUNTER — Other Ambulatory Visit (HOSPITAL_COMMUNITY): Payer: Self-pay | Admitting: Physician Assistant

## 2023-10-01 ENCOUNTER — Ambulatory Visit: Payer: Medicare Other | Admitting: Podiatry

## 2023-10-09 ENCOUNTER — Other Ambulatory Visit: Payer: Self-pay | Admitting: Cardiovascular Disease

## 2023-11-17 ENCOUNTER — Ambulatory Visit (INDEPENDENT_AMBULATORY_CARE_PROVIDER_SITE_OTHER): Payer: Medicare Other

## 2023-11-17 ENCOUNTER — Encounter: Payer: Self-pay | Admitting: Podiatry

## 2023-11-17 ENCOUNTER — Ambulatory Visit: Payer: Medicare Other | Admitting: Podiatry

## 2023-11-17 VITALS — Ht 74.0 in | Wt 252.8 lb

## 2023-11-17 DIAGNOSIS — E11621 Type 2 diabetes mellitus with foot ulcer: Secondary | ICD-10-CM

## 2023-11-17 DIAGNOSIS — M79674 Pain in right toe(s): Secondary | ICD-10-CM

## 2023-11-17 DIAGNOSIS — M79675 Pain in left toe(s): Secondary | ICD-10-CM

## 2023-11-17 DIAGNOSIS — L97521 Non-pressure chronic ulcer of other part of left foot limited to breakdown of skin: Secondary | ICD-10-CM

## 2023-11-17 DIAGNOSIS — E1151 Type 2 diabetes mellitus with diabetic peripheral angiopathy without gangrene: Secondary | ICD-10-CM

## 2023-11-17 DIAGNOSIS — B351 Tinea unguium: Secondary | ICD-10-CM

## 2023-11-17 DIAGNOSIS — L84 Corns and callosities: Secondary | ICD-10-CM

## 2023-11-17 MED ORDER — GENTAMICIN SULFATE 0.1 % EX CREA: TOPICAL_CREAM | CUTANEOUS | 0 refills | Status: DC

## 2023-11-17 NOTE — Patient Instructions (Signed)
   DRESSING CHANGES LEFT FOOT:   PHARMACY SHOPPING LIST: Saline or Wound Cleanser for cleaning wound 2 x 2 inch sterile gauze for cleaning wound GENTAMICIN CREAM  WEAR SURGICAL SHOE LEFT FOOT AT ALL TIMES.   KEEP LEFT FOOT DRY AT ALL TIMES!!!!  CLEANSE ULCER WITH SALINE OR WOUND CLEANSER.  DAB DRY WITH GAUZE SPONGE.  APPLY A LIGHT AMOUNT OF GENTAMICIN CREAM TO BASE OF ULCER.  APPLY OUTER DRESSING AS INSTRUCTED.  WEAR SURGICAL SHOE/BOOT DAILY AT ALL TIMES. IF SUPPLIED, WEAR HEEL PROTECTORS AT ALL TIMES WHEN IN BED.  DO NOT WALK BAREFOOT!!!  IF YOU EXPERIENCE ANY FEVER, CHILLS, NIGHTSWEATS, NAUSEA OR VOMITING, ELEVATED OR LOW BLOOD SUGARS, REPORT TO EMERGENCY ROOM.  IF YOU EXPERIENCE INCREASED REDNESS, PAIN, SWELLING, DISCOLORATION, ODOR, PUS, DRAINAGE OR WARMTH OF YOUR FOOT, REPORT TO EMERGENCY ROOM.

## 2023-11-22 ENCOUNTER — Encounter: Payer: Self-pay | Admitting: Podiatry

## 2023-11-22 ENCOUNTER — Ambulatory Visit: Payer: Medicare Other | Admitting: Podiatry

## 2023-11-22 DIAGNOSIS — M2011 Hallux valgus (acquired), right foot: Secondary | ICD-10-CM | POA: Diagnosis not present

## 2023-11-22 DIAGNOSIS — M2012 Hallux valgus (acquired), left foot: Secondary | ICD-10-CM | POA: Diagnosis not present

## 2023-11-22 DIAGNOSIS — L84 Corns and callosities: Secondary | ICD-10-CM

## 2023-11-23 NOTE — Progress Notes (Signed)
  Subjective:  Patient ID: Timothy [name], male    DOB: 12-26-1940,  MRN: [id number]  Chief Complaint  Patient presents with   Wound Check    "I think it's doing good."    82 y.o. male returns with the above complaint. History confirmed with patient.  He returns for follow-up to reevaluate the preulcerative area to make sure that it is not progressing ulceration he had some bleeding that is from skin tearing from the Band-Aid  Objective:  Physical Exam: warm, good capillary refill, normal DP and PT pulses, reduced sensation at tips of toes and plantar foot bilaterally .  Bilaterally there is severe hallux valgus and digital contractures, the worst of which is the left foot with a and overlapping second toe and likely dislocation of the second metatarsophalangeal joint.    No active ulceration there is hyperkeratotic skin there is partial skin tear from Band-Aid     Radiographs: Not currently indicated, we will obtain these if his condition worsens Assessment:   1. Pre-ulcerative calluses      Plan:  Patient was evaluated and treated and all questions answered.  Ulcer left submet 2 -Ulceration is fully healed there is hyperkeratosis which I debrided today.  He may leave open to air and does not need to bandage any further as he was having skin tearing from bandaging.  He will follow with me as needed likely will be a long-term chronic issue due to his severe bony deformity which is long is not progressing to ulceration would not recommend further or any surgical intervention at this point. Return if symptoms worsen or fail to improve.

## 2023-11-23 NOTE — Progress Notes (Unsigned)
Subjective:  Patient ID: Timothy [name], male    DOB: 12/07/1941,  MRN: [id number]  Timothy [name] presents to clinic today for {jgcomplaint:23593}  Chief Complaint  Patient presents with   Nail Problem    Pt is here for [facility] last A1C was 6.8 PCP is Dr Jeannetta Nap and LOV was in September.      PCP is [name], MD.  No Known Allergies  Review of Systems: Negative except as noted in the HPI.  Objective: No changes noted in today's physical examination. There were no vitals filed for this visit. Timothy [name] is a pleasant 82 y.o. male WD, WN in NAD. AAO x 3.  Vascular Examination: CFT <3 seconds b/l. DP pulses palpable b/l. PT pulses nonpalpable b/l. Digital hair absent. Skin temperature gradient warm to warm b/l. No pain with calf compression. No ischemia or gangrene. No cyanosis or clubbing noted b/l. +1 pitting edema bilateral ankles.   Neurological Examination: Sensation grossly intact b/l with 10 gram monofilament. Vibratory sensation intact b/l.   Dermatological Examination:   Wound Location: submet head 2 left foot There is a minimal amount of devitalized tissue present in the wound. Predebridement Wound Measurement:  0.5  x 0.5 cm. Postdebridement Wound Measurement: 1.5 x 3.0 x 0.2cm. Wound Base: Granular/Healthy Peri-wound: Normal Exudate: Scant/small amount Bloody exudate Blood Loss during debridement: <1/2 cc('s). Sign(s) of clinical bacterial infection: no clinical signs of infection noted on examination today.   Pedal skin warm and supple b/l. Toenails 1-5 b/l thick, discolored, elongated with subungual debris and pain on dorsal palpation.    Porokeratotic lesion(s) submet head 5 left foot.  No erythema, no edema, no drainage, no fluctuance.   Musculoskeletal Examination: Muscle strength 5/5 to b/l LE. 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  Assessment/Plan: 1. Pain due to onychomycosis of  toenails of both feet   2. Diabetic ulcer of left foot associated with type 2 diabetes mellitus, limited to breakdown of skin, unspecified part of foot (HCC)   3. Type II diabetes mellitus with peripheral circulatory disorder (HCC)     Meds ordered this encounter  Medications   gentamicin cream (GARAMYCIN) 0.1 %    Sig: APPLY TO LEFT FOOT ULCER ONCE DAILY.    Dispense:  30 g    Refill:  0  -Plan: -Patient's family member present. All questions/concerns addressed on today's visit. -Patient instructed to wear Darco shoe daily until he sees Dr. Lilian Kapur on next week. -Patient was evaluated and treated and all questions answered.  -Patient/POA/Family member educated on diagnosis and treatment plan of routine ulcer debridement/wound care.  -Ulceration debridement achieved utilizing sharp excisional debridement with sterile scalpel blade and sterile currette.. Type/amount of devitalized tissue removed: necrotic tissue -Ulceration cleansed with wound cleanser. triple antibiotic ointment applied to base of ulceration and secured with light dressing. -Wound responded well to today's debridement. -Patient risk factors affecting healing of ulcer: diabetes, history of foot/leg ulcer -Timothy [name] given written instructions on daily wound care for {jgPodToeLocator:23637} ulceration. -{jgrx:23616} -Orders for Home Health Wound care to be faxed to: *** -Frequency of debridements needed to achieve healing:  -Wound culture and sensitivity ordered today. -Consultations ordered today:  -Radiology ordered today: {Radiology:18555}. -Labs ordered today: {micro:18246}. -{Jgplan:23602::"-Patient/POA to call should there be question/concern in the interim."}  Return in about 1 week (around 11/24/2023).  Freddie Breech, DPM       LOCATION: [address]  [place], [place] [address]                   Office [phone]   [facility]  LOCATION: [address] [place], [place] [address] Office [phone]   -Patient/POA to call should there be question/concern in the interim.   Return in about 1 week (around 11/24/2023).  Freddie Breech, DPM      [place] LOCATION: [address] [place] [address]                   Office [phone]   [facility] LOCATION: [address] [place], [place] [address] Office [phone]

## 2023-12-14 ENCOUNTER — Other Ambulatory Visit: Payer: Self-pay | Admitting: Physician Assistant

## 2023-12-15 LAB — DERMATOLOGY PATHOLOGY

## 2024-01-19 ENCOUNTER — Ambulatory Visit: Payer: Medicare Other | Admitting: Podiatry

## 2024-01-19 DIAGNOSIS — Z91199 Patient's noncompliance with other medical treatment and regimen due to unspecified reason: Secondary | ICD-10-CM

## 2024-01-21 NOTE — Progress Notes (Signed)
1. No-show for appointment

## 2024-02-15 ENCOUNTER — Other Ambulatory Visit (HOSPITAL_COMMUNITY): Payer: Self-pay | Admitting: Physician Assistant

## 2024-03-08 ENCOUNTER — Encounter: Payer: Self-pay | Admitting: Podiatry

## 2024-03-08 ENCOUNTER — Ambulatory Visit: Payer: Medicare Other | Admitting: Podiatry

## 2024-03-08 VITALS — Ht 74.0 in | Wt 252.0 lb

## 2024-03-08 DIAGNOSIS — M79674 Pain in right toe(s): Secondary | ICD-10-CM | POA: Diagnosis not present

## 2024-03-08 DIAGNOSIS — M79675 Pain in left toe(s): Secondary | ICD-10-CM | POA: Diagnosis not present

## 2024-03-08 DIAGNOSIS — E1151 Type 2 diabetes mellitus with diabetic peripheral angiopathy without gangrene: Secondary | ICD-10-CM

## 2024-03-08 DIAGNOSIS — E08621 Diabetes mellitus due to underlying condition with foot ulcer: Secondary | ICD-10-CM

## 2024-03-08 DIAGNOSIS — B351 Tinea unguium: Secondary | ICD-10-CM | POA: Diagnosis not present

## 2024-03-08 DIAGNOSIS — L97422 Non-pressure chronic ulcer of left heel and midfoot with fat layer exposed: Secondary | ICD-10-CM

## 2024-03-08 DIAGNOSIS — Q828 Other specified congenital malformations of skin: Secondary | ICD-10-CM

## 2024-03-13 NOTE — Progress Notes (Signed)
 Subjective: Chief Complaint  Patient presents with   RFC    He is here for a nail trim, PCP is dr Jeannetta Nap and seen every 6 months.    Patient presents today at risk foot care. Pt has h/o NIDDM with PAD and preulcerative lesion(s) left foot and painful mycotic toenails that limit ambulation. Painful toenails interfere with ambulation. Aggravating factors include wearing enclosed shoe gear. Pain is relieved with periodic professional debridement. Painful preulcerative lesion(s) is/are aggravated when weightbearing with and without shoegear. Pain is relieved with periodic professional debridement.  New concern today: Location: left foot area of previous ulceration. Patient states he has noticed a small amount of bloody drainage on his socks. Denies any redness, swelling, fever, chills, night sweats, nausea or vomiting.    [name], MD is patient's PCP.  Current Outpatient Medications on File Prior to Visit  Medication Sig Dispense Refill   ELIQUIS 2.5 MG TABS tablet TAKE 1 TABLET BY MOUTH TWICE  DAILY 200 tablet 2   furosemide (LASIX) 40 MG tablet Take 40 mg by mouth as needed for fluid or edema.     gentamicin cream (GARAMYCIN) 0.1 % APPLY TO LEFT FOOT ULCER ONCE DAILY. 30 g 0   griseofulvin (GRIFULVIN V) 500 MG tablet Take 500 mg by mouth daily.     insulin NPH-regular Human (NOVOLIN 70/30) (70-30) 100 UNIT/ML injection Patient injects 35 units in the morning and 38 units in the evening     JARDIANCE 10 MG TABS tablet Take 10 mg by mouth daily.     ketoconazole (NIZORAL) 2 % cream Apply 1 Application topically 2 (two) times daily.     lisinopril (ZESTRIL) 10 MG tablet Take 10 mg by mouth daily.     loratadine (CLARITIN) 10 MG tablet Take 10 mg by mouth daily as needed for allergies.     metFORMIN (GLUCOPHAGE) 500 MG tablet Take 500 mg by mouth 2 (two) times daily with a meal.     metoprolol tartrate (LOPRESSOR) 100 MG tablet TAKE 1 TABLET BY MOUTH TWICE  DAILY 200 tablet 2    ONETOUCH ULTRA test strip 1 each daily.     pravastatin (PRAVACHOL) 40 MG tablet TAKE 1 TABLET BY MOUTH IN THE  EVENING 100 tablet 2   RELION INSULIN SYRINGE 31G X 15/64" 1 ML MISC USE 1 TWICE DAILY TO INJECT A MAX OF 85 UNITS LABS IN APRIL     traMADol (ULTRAM) 50 MG tablet Take 50 mg by mouth as needed for moderate pain or severe pain.     No current facility-administered medications on file prior to visit.     No Known Allergies   Objective: There were no vitals filed for this visit.  JUNIOR HUEZO is a pleasant 83 y.o. male obese in NAD. AAO X 3.  Vascular Examination: CFT <3 seconds b/l LE. Palpable DP pulse(s) b/l LE. Diminished PT pulse(s) b/l LE. Pedal hair absent. No pain with calf compression b/l. Lower extremity skin temperature gradient within normal limits. Dependent edema noted LLE. No ischemia or gangrene noted b/l LE. No cyanosis or clubbing noted b/l LE.  Dermatological Examination: Pedal skin is warm and supple b/l LE. No interdigital macerations noted b/l LE. Toenails 1-5 b/l elongated, discolored, dystrophic, thickened, crumbly with subungual debris and tenderness to dorsal palpation. Porokeratotic lesion(s) submet head 5 left foot. No erythema, no edema, no drainage, no fluctuance.  Wound Location: submet head 2 left foot There is a mild amount of devitalized tissue present  in the wound. Predebridement Wound Measurement:  1.5  x 2.0 cm with hemorrhagic hyperkeratotic roof, without fluctuance. Postdebridement Wound Measurement: 1.0 x 0.4 x 0.2 cm. Wound Base: Granular/Healthy Peri-wound: Normal Exudate: Scant/small amount sanguinous drainage Blood Loss during debridement:<1 cc('s). Sign(s) of clinical bacterial infection: no clinical signs of infection noted on examination today.   Musculoskeletal Examination: Muscle strength 5/5 to all lower extremity muscle groups bilaterally. HAV with bunion deformity noted b/l LE. Hammertoe deformity noted 2-5  b/l.  Neurological Examination: Protective sensation intact 5/5 intact bilaterally with 10g monofilament b/l. Vibratory sensation intact b/l.  Assessment and Plan 1. Pain due to onychomycosis of toenails of both feet   2. Porokeratosis   3. Diabetic ulcer of left midfoot associated with diabetes mellitus due to underlying condition, with fat layer exposed (HCC)   4. Type II diabetes mellitus with peripheral circulatory disorder [facility])    -Patient was evaluated and treated and all questions answered.  -Recommended 1-2 week follow up with Dr. Lilian Kapur. Patient declines stating ulcer will heal. He will call to schedule appointment with Dr. Lilian Kapur if he has any problems/condition worsens. -Again recommended diabetic shoes/custom insoles. Patient declines on today's visit. -Ulceration debridement achieved utilizing sharp excisional debridement to level of subcutaneous tissue with sterile scalpel blade and sterile currette.. Type/amount of devitalized tissue removed: necrotic tissue -Today's ulcer size post-debridement: 1.0 x 0.4 x 0.2 cm. -Ulceration cleansed with wound cleanser. triple antibiotic ointment applied to base of ulceration and secured with light dressing. -Wound responded well to today's debridement. -[name] given verbal instructions on daily wound care for left foot ulceration. Wife assists him with daily dressing changes. He is to cleanse area with wound cleanser and apply Gentamicin Cream once daily until healed. Again, see Dr. Lilian Kapur if condition worsens. -Continue offloading with callus pad. -Toenails 1-5 b/l were debrided in length and girth with sterile nail nippers and dremel without iatrogenic bleeding.  -Porokeratotic lesion(s) submet head 5 left foot pared and enucleated with sterile currette without incident. Total number of lesions debrided=1. -Patient/POA to call should there be question/concern in the interim.  Return in about 3 months (around  06/07/2024).  Freddie Breech, DPM      [place] LOCATION: [address], [place] [address]                   Office [phone]   [facility] LOCATION: [address] [place], [place] [address] Office [phone]

## 2024-06-17 ENCOUNTER — Other Ambulatory Visit: Payer: Self-pay | Admitting: Cardiovascular Disease

## 2024-06-20 ENCOUNTER — Ambulatory Visit: Admitting: Podiatry

## 2024-06-20 DIAGNOSIS — L97422 Non-pressure chronic ulcer of left heel and midfoot with fat layer exposed: Secondary | ICD-10-CM

## 2024-06-20 DIAGNOSIS — E1151 Type 2 diabetes mellitus with diabetic peripheral angiopathy without gangrene: Secondary | ICD-10-CM

## 2024-06-20 DIAGNOSIS — M79675 Pain in left toe(s): Secondary | ICD-10-CM | POA: Diagnosis not present

## 2024-06-20 DIAGNOSIS — M79674 Pain in right toe(s): Secondary | ICD-10-CM | POA: Diagnosis not present

## 2024-06-20 DIAGNOSIS — E08621 Diabetes mellitus due to underlying condition with foot ulcer: Secondary | ICD-10-CM

## 2024-06-20 DIAGNOSIS — B351 Tinea unguium: Secondary | ICD-10-CM | POA: Diagnosis not present

## 2024-06-21 ENCOUNTER — Encounter: Payer: Self-pay | Admitting: Podiatry

## 2024-06-21 NOTE — Progress Notes (Signed)
 Subjective:  Patient ID: Timothy [name], male    DOB: 01-13-1941,  MRN: [id number]  Timothy [name] presents to clinic today for at risk foot care. Pt has h/o NIDDM with PAD and preulcerative lesion(s) left foot and painful mycotic toenails that limit ambulation. Painful toenails interfere with ambulation. Aggravating factors include wearing enclosed shoe gear. Pain is relieved with periodic professional debridement. Painful preulcerative lesion(s) is/are aggravated when weightbearing with and without shoegear. Pain is relieved with periodic professional debridement.  He states his wife has performed the daily dressing changes and area has healed. He did not schedule follow up with Dr. Silva as he states he did not have any problems. He has been using the Gentamicin  Cream on his ulcer. Denies any fever, chills, nightsweats, nausea or vomiting. Patient declines diabetic shoes. Chief Complaint  Patient presents with   Diabetes    DFC IDDM A1C 6.7. Toenail trim. LOV with PCP 02/2024.   New problem(s): Patient had unknown skin tear plantar aspect on the ball of the left foot. Unsure of how he sustained it.  PCP is [place] Tanda Mae, MD.  No Known Allergies  Review of Systems: Negative except as noted in the HPI.  Objective:  There were no vitals filed for this visit. COADY TRAIN is a pleasant 83 y.o. male obese in NAD. AAO x 3.  Vascular Examination: CFT <3 seconds b/l LE. Palpable DP pulse(s) b/l LE. Diminished PT pulse(s) b/l LE. Pedal hair absent. No pain with calf compression b/l. Lower extremity skin temperature gradient within normal limits. Dependent edema noted LLE. No ischemia or gangrene noted b/l LE. No cyanosis or clubbing noted b/l LE.  Dermatological Examination: Pedal skin is warm and supple b/l LE. Evidence of skin tear submet head 3 left foot with loose flap of skin. There is dried heme. No erythema, no edema, no drainage, no odor. No interdigital macerations noted  b/l LE. Toenails 1-5 b/l elongated, discolored, dystrophic, thickened, crumbly with subungual debris and tenderness to dorsal palpation. Preulcerative lesion(s) submet head 5 left foot. No erythema, no edema, no drainage, no fluctuance.  Postdebridement photos:     Wound Location: submet head 2 left foot There is devitalized tissue present in the wound. Predebridement Wound Measurement:  0.3  x 0.2 x 0 cm with hemorrhagic hyperkeratotic roof. No surrounding erythema, no edema, no drainage nor odor. Postdebridement Wound Measurement: 0.5 x 0.3 x 0.2 cm. Wound Base: Granular/Healthy Peri-wound: Normal Exudate: None: wound tissue dry Blood Loss during debridement: 0 cc('s). Sign(s) of clinical bacterial infection: no clinical signs of infection noted on examination today.   Musculoskeletal Examination: Muscle strength 5/5 to all lower extremity muscle groups bilaterally. HAV with bunion deformity noted b/l LE. Hammertoe deformity noted 2-5 b/l.  Neurological Examination: Protective sensation intact 5/5 intact bilaterally with 10g monofilament b/l. Vibratory sensation intact b/l.  Assessment/Plan: 1. Pain due to onychomycosis of toenails of both feet   2. 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 Type II diabetes mellitus with peripheral circulatory disorder [facility])     -Consent given for treatment as described below: -Examined patient. -Patient was evaluated and treated and all questions answered.  -Patient/POA/Family member educated on diagnosis and treatment plan of routine ulcer debridement/wound care.  -Ulceration debridement achieved utilizing sharp excisional debridement with sterile scalpel blade and sterile currette.. Type/amount of devitalized tissue removed: nonviable hyperkeratosis -Today's ulcer size post-debridement: 0.5 x 0.3 x  0.2 cm. No clinical signs of infection noted  today. -Ulceration cleansed with wound cleanser. triple antibiotic ointment applied to base of ulceration and secured with light dressing. -Diabetic ulcer responded well to today's debridement.He is to perform once daily Gentamicin  Cream dressings to left foot ulcer with offloading pad after dressing applied. He is to apply Gentamicin  Cream to skin tear ball of left foot. Follow up with Dr. Juliene Medicine if condition worsens as he declines 2 week follow up appointment. -Mycotic toenails 1-5 bilaterally were debrided in length and girth with sterile nail nippers and dremel without incident. -Preulcerative lesion pared submet head 5 left foot utilizing sterile scalpel blade. Total number pared=1. -Patient/POA to call should there be question/concern in the interim.  Return in about 3 months (around 09/20/2024).  Delon LITTIE Merlin, DPM      [place] LOCATION: [address] [place] [address]                   Office [phone]   [facility] LOCATION: [address] [place], [place] [address] Office [phone]

## 2024-07-22 ENCOUNTER — Other Ambulatory Visit (HOSPITAL_COMMUNITY): Payer: Self-pay | Admitting: Physician Assistant

## 2024-08-07 ENCOUNTER — Emergency Department (HOSPITAL_COMMUNITY)

## 2024-08-07 ENCOUNTER — Emergency Department (HOSPITAL_COMMUNITY)
Admission: EM | Admit: 2024-08-07 | Discharge: 2024-08-07 | Disposition: A | Discharge status: Home / Self Care | Attending: Emergency Medicine | Admitting: Emergency Medicine

## 2024-08-07 DIAGNOSIS — Z794 Long term (current) use of insulin: Secondary | ICD-10-CM | POA: Diagnosis not present

## 2024-08-07 DIAGNOSIS — E11649 Type 2 diabetes mellitus with hypoglycemia without coma: Secondary | ICD-10-CM | POA: Diagnosis not present

## 2024-08-07 DIAGNOSIS — Z8673 Personal history of transient ischemic attack (TIA), and cerebral infarction without residual deficits: Secondary | ICD-10-CM | POA: Insufficient documentation

## 2024-08-07 DIAGNOSIS — Z7984 Long term (current) use of oral hypoglycemic drugs: Secondary | ICD-10-CM | POA: Insufficient documentation

## 2024-08-07 DIAGNOSIS — E860 Dehydration: Secondary | ICD-10-CM | POA: Diagnosis not present

## 2024-08-07 DIAGNOSIS — Z7901 Long term (current) use of anticoagulants: Secondary | ICD-10-CM | POA: Diagnosis not present

## 2024-08-07 DIAGNOSIS — E162 Hypoglycemia, unspecified: Secondary | ICD-10-CM

## 2024-08-07 DIAGNOSIS — R296 Repeated falls: Secondary | ICD-10-CM | POA: Diagnosis present

## 2024-08-07 LAB — CBC WITH DIFFERENTIAL/PLATELET
Abs Immature Granulocytes: 0.04 K/uL (ref 0.00–0.07)
Basophils Absolute: 0.1 K/uL (ref 0.0–0.1)
Basophils Relative: 1 %
Eosinophils Absolute: 0.2 K/uL (ref 0.0–0.5)
Eosinophils Relative: 3 %
HCT: 45.2 % (ref 39.0–52.0)
Hemoglobin: 14.3 g/dL (ref 13.0–17.0)
Immature Granulocytes: 1 %
Lymphocytes Relative: 18 %
Lymphs Abs: 1.6 K/uL (ref 0.7–4.0)
MCH: 31.4 pg (ref 26.0–34.0)
MCHC: 31.6 g/dL (ref 30.0–36.0)
MCV: 99.3 fL (ref 80.0–100.0)
Monocytes Absolute: 0.7 K/uL (ref 0.1–1.0)
Monocytes Relative: 8 %
Neutro Abs: 6.3 K/uL (ref 1.7–7.7)
Neutrophils Relative %: 69 %
Platelets: 234 K/uL (ref 150–400)
RBC: 4.55 MIL/uL (ref 4.22–5.81)
RDW: 13.7 % (ref 11.5–15.5)
WBC: 8.9 K/uL (ref 4.0–10.5)
nRBC: 0 % (ref 0.0–0.2)

## 2024-08-07 LAB — BASIC METABOLIC PANEL WITH GFR
Anion gap: 9 (ref 5–15)
BUN: 28 mg/dL — ABNORMAL HIGH (ref 8–23)
CO2: 26 mmol/L (ref 22–32)
Calcium: 9.1 mg/dL (ref 8.9–10.3)
Chloride: 105 mmol/L (ref 98–111)
Creatinine, Ser: 2.06 mg/dL — ABNORMAL HIGH (ref 0.61–1.24)
GFR, Estimated: 31 mL/min — ABNORMAL LOW (ref >60)
Glucose, Bld: 47 mg/dL — ABNORMAL LOW (ref 70–99)
Potassium: 4.3 mmol/L (ref 3.5–5.1)
Sodium: 140 mmol/L (ref 135–145)

## 2024-08-07 LAB — I-STAT CHEM 8, ED
BUN: 34 mg/dL — ABNORMAL HIGH (ref 8–23)
Calcium, Ion: 1.14 mmol/L — ABNORMAL LOW (ref 1.15–1.40)
Chloride: 105 mmol/L (ref 98–111)
Creatinine, Ser: 2.2 mg/dL — ABNORMAL HIGH (ref 0.61–1.24)
Glucose, Bld: 45 mg/dL — ABNORMAL LOW (ref 70–99)
HCT: 44 % (ref 39.0–52.0)
Hemoglobin: 15 g/dL (ref 13.0–17.0)
Potassium: 4.4 mmol/L (ref 3.5–5.1)
Sodium: 142 mmol/L (ref 135–145)
TCO2: 26 mmol/L (ref 22–32)

## 2024-08-07 LAB — CBG MONITORING, ED
Glucose-Capillary: 35 mg/dL — CL (ref 70–99)
Glucose-Capillary: 153 mg/dL — ABNORMAL HIGH (ref 70–99)

## 2024-08-07 LAB — I-STAT CG4 LACTIC ACID, ED: Lactic Acid, Venous: 1 mmol/L (ref 0.5–1.9)

## 2024-08-07 LAB — MAGNESIUM: Magnesium: 2.4 mg/dL (ref 1.7–2.4)

## 2024-08-07 MED ORDER — DEXTROSE 50 % IV SOLN
1.0000 | Freq: Once | INTRAVENOUS | Status: AC
  Administered 2024-08-07: 50 mL via INTRAVENOUS

## 2024-08-07 NOTE — ED Provider Notes (Signed)
 [place] EMERGENCY DEPARTMENT AT [facility] Provider Note   CSN: [id number] Arrival date & time: 08/07/24  1815     Patient presents with: Fall and Hypoglycemia   Timothy [name] is a 83 y.o. male.   HPI   83 year old male presents emergency department from patient floor as a level 2 trauma with concern for fall, head injury on Eliquis .  Patient was reportedly in his wife's room when she noticed him lower down to the ground and she called for help.  Patient arrived minimally responsive to the room, palpable pulse.  Fingerstick shows a glucose of 35.  Patient is insulin -dependent diabetic.  IV immediately established and D50 given with return of mentation.  Patient has no acute traumatic findings, he does not believe that he fell or hit his head but cannot tell me how he ended up on the ground.  He is not complaining of any headache, neck pain, back pain, hip or extremity pain.  Admits to decreased p.o. intake today secondary to visiting his wife in the hospital.  Prior to Admission medications   Medication Sig Start Date End Date Taking? Authorizing Provider  ELIQUIS  2.5 MG TABS tablet TAKE 1 TABLET BY MOUTH TWICE  DAILY 02/16/24   Fenton, Clint R, PA  furosemide  (LASIX ) 40 MG tablet Take 40 mg by mouth as needed for fluid or edema.    [provider]  gentamicin  cream (GARAMYCIN ) 0.1 % APPLY TO LEFT FOOT ULCER ONCE DAILY. 11/17/23   Gaynel Delon CROME, DPM  griseofulvin (GRIFULVIN V) 500 MG tablet Take 500 mg by mouth daily. 12/24/23   [provider]  insulin  NPH-regular Human (NOVOLIN 70/30) (70-30) 100 UNIT/ML injection Patient injects 35 units in the morning and 38 units in the evening    [provider]  JARDIANCE 10 MG TABS tablet Take 10 mg by mouth daily. 03/02/24   [provider]  ketoconazole (NIZORAL) 2 % cream Apply 1 Application topically 2 (two) times daily. 01/07/24   [provider]  lisinopril (ZESTRIL) 10 MG tablet  Take 10 mg by mouth daily. 07/19/23   [provider]  loratadine  (CLARITIN ) 10 MG tablet Take 10 mg by mouth daily as needed for allergies.    [provider]  metFORMIN  (GLUCOPHAGE ) 500 MG tablet Take 500 mg by mouth 2 (two) times daily with a meal.    [provider]  metoprolol  tartrate (LOPRESSOR ) 100 MG tablet Take 1 tablet (100 mg total) by mouth 2 (two) times daily. Appointment Required For Further Refills [phone] 07/24/24   Terra Fairy PARAS, PA-C  [facility] ULTRA test strip 1 each daily. 08/05/20   [provider]  pravastatin  (PRAVACHOL ) 40 MG tablet TAKE 1 TABLET BY MOUTH IN THE  EVENING 06/19/24   Verlin Lonni BIRCH, MD  RELION INSULIN  SYRINGE 31G X 15/64 1 ML MISC USE 1 TWICE DAILY TO INJECT A MAX OF 85 UNITS LABS IN APRIL 06/29/20   [provider]  traMADol (ULTRAM) 50 MG tablet Take 50 mg by mouth as needed for moderate pain or severe pain. 07/02/22   [provider]    Allergies: Patient has no known allergies.    Review of Systems  Constitutional:  Negative for fever.  Respiratory:  Negative for shortness of breath.   Cardiovascular:  Negative for chest pain.  Gastrointestinal:  Negative for abdominal pain, diarrhea and vomiting.  Musculoskeletal:  Negative for back pain, neck pain and neck stiffness.  Skin:  Negative for  rash.  Neurological:  Negative for headaches.    Updated Vital Signs BP (!) 142/79   Pulse 93   Temp (!) 97.4 F (36.3 C) (Oral)   Resp 19   SpO2 100%   Physical Exam Vitals and nursing note reviewed.  Constitutional:      General: He is not in acute distress.    Appearance: Normal appearance.  HENT:     Head: Normocephalic.     Right Ear: External ear normal.     Left Ear: External ear normal.     Mouth/Throat:     Mouth: Mucous membranes are moist.  Eyes:     Extraocular Movements: Extraocular movements intact.     Pupils: Pupils are equal, round, and reactive to light.   Cardiovascular:     Rate and Rhythm: Normal rate.  Pulmonary:     Effort: Pulmonary effort is normal. No respiratory distress.  Abdominal:     Palpations: Abdomen is soft.     Tenderness: There is no abdominal tenderness.  Musculoskeletal:        General: No swelling, tenderness, deformity or signs of injury.     Cervical back: No rigidity or tenderness.  Skin:    General: Skin is warm.  Neurological:     General: No focal deficit present.     Mental Status: He is alert and oriented to person, place, and time. Mental status is at baseline.  Psychiatric:        Mood and Affect: Mood normal.     (all labs ordered are listed, but only abnormal results are displayed) Labs Reviewed  CBG MONITORING, ED - Abnormal; Notable for the following components:      Result Value   Glucose-Capillary 35 (*)    All other components within normal limits  I-STAT CHEM 8, ED - Abnormal; Notable for the following components:   BUN 34 (*)    Creatinine, Ser 2.20 (*)    Glucose, Bld 45 (*)    Calcium , Ion 1.14 (*)    All other components within normal limits  CBC WITH DIFFERENTIAL/PLATELET  BASIC METABOLIC PANEL WITH GFR  MAGNESIUM  I-STAT CG4 LACTIC ACID, ED    EKG: EKG Interpretation Date/Time:  Monday August 07 2024 18:26:26 EDT Ventricular Rate:  93 PR Interval:    QRS Duration:  131 QT Interval:  406 QTC Calculation: 505 R Axis:   -62  Text Interpretation: Atrial flutter Nonspecific IVCD with LAD Confirmed by Bari Flank [phone]) on 08/07/2024 6:34:50 PM  Radiology: No results found.   .Critical Care  Performed by: Bari Flank HERO, DO Authorized by: Bari Flank HERO, DO   Critical care provider statement:    Critical care time (minutes):  30   Critical care was necessary to treat or prevent imminent or life-threatening deterioration of the following conditions:  Endocrine crisis and metabolic crisis   Critical care was time spent personally by me on the following  activities:  Development of treatment plan with patient or surrogate, discussions with consultants, evaluation of patient's response to treatment, examination of patient, ordering and review of laboratory studies, ordering and review of radiographic studies, ordering and performing treatments and interventions, pulse oximetry, re-evaluation of patient's condition and review of old charts   I assumed direction of critical care for this patient from another provider in my specialty: no      Medications Ordered in the ED  dextrose  50 % solution 50 mL (50 mLs Intravenous Given 08/07/24 1821)  Medical Decision Making Amount and/or Complexity of Data Reviewed Labs: ordered. Radiology: ordered.  Risk Prescription drug management.   83 year old male presents emergency department originally as a level 2 trauma.  Patient was found on the floor, presumed fall and reportedly on anticoagulation.  Patient was minimally responsive on arrival to the ER.  Fingerstick shows a CBG of 35.  Patient is diabetic and on insulin .  After IV dose of dextrose  patient woke up and was able to talk to his.  Level 2 trauma canceled.  He was visiting his wife in the hospital, has had nothing to eat or drink today, otherwise was compliant with insulin .  He has no pain complaint at this time, but does not remember how he got on the floor, presumed fall/syncope.  Blood work shows mild dehydration, blood sugar normalized.  CT imaging of the head is unremarkable.  He had no other traumatic complaints, is very well on exam.  Patient was allowed to eat and drink, repeat CBG is appropriate.  Patient educated on hydration and the importance of small meals.  Family at bedside and comfortable being with him at discharge.  Patient at this time appears safe and stable for discharge and close outpatient follow up. Discharge plan and strict return to ED precautions discussed, patient verbalizes  understanding and agreement.     Final diagnoses:  None    ED Discharge Orders     None          Bari Roxie HERO, DO 08/07/24 2349

## 2024-08-07 NOTE — ED Notes (Signed)
 Patient ambulated from his room to the end of the nurses station. Patient denies dizziness or headache. States he normally walks with a cane due to his arthritis. Patient was able to ambulate with little to no assistance.

## 2024-08-07 NOTE — Discharge Instructions (Signed)
 Your blood sugar was dangerously low. Please eat regular meals. You were also dehydrated, please drink a lot of water. Follow up with your primary doctor.

## 2024-08-07 NOTE — Progress Notes (Deleted)
 Orthopedic Tech Progress Note Patient Details:  DAI MCADAMS Jan 23, 1941 [id number]  Level 2 trauma  Patient ID: Norleen ONEIDA Lady, male   DOB: [birth year], 83 y.o.   MRN: [id number]  Delanna LITTIE Pac 08/07/2024, 6:25 PM

## 2024-08-07 NOTE — ED Triage Notes (Signed)
 Pt brought into ED by Rapid from the floor d/t pt falling while visiting his wife upstairs. Pt did fall this morning but was not seen, does take Eliquis . Does take thinners. Altered & responds to verbal with periods of responding to pain. No visible injuries to his head & CBG was 35 when checked in ED upon arrival.

## 2024-08-07 NOTE — ED Notes (Signed)
 Trauma Response Nurse Documentation   Timothy [name] is a 83 y.o. male arriving to [facility] ED via EMS  On Eliquis  (apixaban ) daily. Trauma was activated as a Level 2 by EDP based on the following trauma criteria GCS 14. Pt found to have a CBG of 35 and improved to 15 after dextrose  administration. Trauma canceled per Dr. Bari at bedside.   Sherrye Puga O Augustine Brannick  Trauma Response RN  Please call TRN at [phone] for further assistance.

## 2024-08-07 NOTE — ED Notes (Signed)
Patient was given something to drink.  

## 2024-08-07 NOTE — ED Notes (Signed)
 Patient brought to ED  from upstairs by rapid response, patient altered mentally. No obvious trauma to patient, but patient takes eliquis . Level 2 activated due to patient's mental status. CBG checked and found to be 35. Dr Bari bedside stated to cancel level 2 after patient was given D50 and mentation improved. Patient given juice with approval of Dr. Bari.

## 2024-10-03 ENCOUNTER — Ambulatory Visit: Admitting: Podiatry

## 2024-10-03 ENCOUNTER — Ambulatory Visit (INDEPENDENT_AMBULATORY_CARE_PROVIDER_SITE_OTHER)

## 2024-10-03 ENCOUNTER — Encounter: Payer: Self-pay | Admitting: Podiatry

## 2024-10-03 VITALS — BP 105/55 | HR 57 | Temp 98.6°F

## 2024-10-03 DIAGNOSIS — M79675 Pain in left toe(s): Secondary | ICD-10-CM | POA: Diagnosis not present

## 2024-10-03 DIAGNOSIS — L97422 Non-pressure chronic ulcer of left heel and midfoot with fat layer exposed: Secondary | ICD-10-CM

## 2024-10-03 DIAGNOSIS — M79674 Pain in right toe(s): Secondary | ICD-10-CM | POA: Diagnosis not present

## 2024-10-03 DIAGNOSIS — E08621 Diabetes mellitus due to underlying condition with foot ulcer: Secondary | ICD-10-CM | POA: Diagnosis not present

## 2024-10-03 DIAGNOSIS — L97529 Non-pressure chronic ulcer of other part of left foot with unspecified severity: Secondary | ICD-10-CM

## 2024-10-03 DIAGNOSIS — B351 Tinea unguium: Secondary | ICD-10-CM | POA: Diagnosis not present

## 2024-10-03 DIAGNOSIS — E1151 Type 2 diabetes mellitus with diabetic peripheral angiopathy without gangrene: Secondary | ICD-10-CM

## 2024-10-03 DIAGNOSIS — L84 Corns and callosities: Secondary | ICD-10-CM | POA: Diagnosis not present

## 2024-10-06 LAB — WOUND CULTURE
MICRO NUMBER:: 17158524
SPECIMEN QUALITY:: ADEQUATE

## 2024-10-08 NOTE — Progress Notes (Signed)
 Subjective:  Patient ID: Timothy [name], male    DOB: 08-14-1941,  MRN: [id number]  Timothy [name] presents to clinic today for at risk foot care with h/o ulceration plantar aspect of left foot. Patient states he has noticed some blood on his band-aid and padding. Patient states his wife is in rehab after suffering a fall. States he is more concerned about her well beaing at the moment.   Chief Complaint  Patient presents with   Diabetes    Toenail trim and work on a callus that I have on the bottom.  Saw Dr. Loring - 2-3 weeks ago; A1c - 6.4    PCP is Loring Tanda Mae, MD.  No Known Allergies  Review of Systems: Negative except as noted in the HPI.  Objective: No changes noted in today's physical examination. Vitals:   10/03/24 1520  BP: (!) 105/55  Pulse: (!) 57  Temp: 98.6 F (37 C)   Timothy [name] is a pleasant 83 y.o. male obese in NAD. AAO x 3. Vascular Examination: CFT <3 seconds b/l LE. Palpable DP pulse(s) b/l LE. Diminished PT pulse(s) b/l LE. Pedal hair absent. No pain with calf compression b/l. Lower extremity skin temperature gradient within normal limits. Dependent edema noted LLE. No ischemia or gangrene noted b/l LE. No cyanosis or clubbing noted b/l LE.  Dermatological Examination: Pedal skin is warm and supple b/l LE. Evidence of skin tear submet head 3 left foot with loose flap of skin. There is dried heme. No erythema, no edema, no drainage, no odor. No interdigital macerations noted b/l LE. Toenails 1-5 b/l elongated, discolored, dystrophic, thickened, crumbly with subungual debris and tenderness to dorsal palpation. Preulcerative lesion(s) submet head 5 left foot. No erythema, no edema, no drainage, no fluctuance.      Wound Location: submet head 2 left foot There is a moderate amount of devitalized tissue present in the wound. Predebridement Wound Measurement:  0.5  x 0.4 x 0.2 cm. Postdebridement Wound Measurement: 1.7 x 0.6 x 0.3 cm. Wound  Base: Granular/Healthy Peri-wound: Normal Exudate: Scant/small amount sanguinous drainage Blood Loss during debridement: 1 cc('s). Sign(s) of clinical bacterial infection: no clinical signs of infection noted on examination today.  Musculoskeletal Examination: Muscle strength 5/5 to all lower extremity muscle groups bilaterally. HAV with bunion deformity noted b/l LE. Hammertoe deformity noted 2-5 b/l.  Neurological Examination: Protective sensation intact 5/5 intact bilaterally with 10g monofilament b/l. Vibratory sensation intact b/l.  Xray findings left foot: No gas in tissues left foot. No bone erosion noted at location of ulceration submet head 2 left foot. No evidence of fracture left foot.  Assessment/Plan: 1. Pain due to onychomycosis of toenails of both feet   2. 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 Pre-ulcerative calluses   4. Type II diabetes mellitus with peripheral circulatory disorder (HCC)    Plan: Orders Placed This Encounter  Procedures   WOUND CULTURE    Left foot ball of foot plantar.    Source:   left foot   DG Foot Complete Left    Standing Status:   Future    Number of Occurrences:   1    Expiration Date:   10/03/2025    Reason for Exam (SYMPTOM  OR DIAGNOSIS REQUIRED):   ulcer    Preferred imaging location?:   Internal  -Patient was evaluated and treated and all questions answered.  -Patient/POA/Family member  educated on diagnosis and treatment plan of routine ulcer debridement/wound care.  -Ulceration debridement achieved utilizing sharp excisional debridement with sterile scalpel blade and sterile currette.. Type/amount of devitalized tissue removed: necrotic tissue -Today's ulcer size post-debridement: 1.7 x 0.6 x 0.3 cm. -Ulceration cleansed with wound cleanser. Silvadene cream applied to base of ulceration and secured with light dressing. -Wound responded well  to today's debridement. -Frequency of debridements needed to achieve healing: weekly to bi-weekly -Wound culture and sensitivity ordered today. -Consultations ordered today:  -Radiology ordered today: X-Ray: left foot. -Patient declines weekly to biweekly follow up stating he needs to see about his wife. States he will dress his foot and apply offloading pad. Patient agrees to follow up with Dr. Silva if he experiences drainage, odor, redness, swelling, fever or chills.. -Toenails 1-5 b/l were debrided in length and girth with sterile nail nippers and dremel without iatrogenic bleeding.  -Preulcerative lesion pared submet head 5 left foot utilizing sterile scalpel blade. Total number pared=1. -Patient/POA to call should there be question/concern in the interim. Return in about 3 months (around 01/03/2025).  Delon LITTIE Merlin, DPM      [place] LOCATION: [address] [place] [address]                   Office [phone]   [facility] LOCATION: [address] [place], [place] [address] Office [phone]

## 2024-10-23 ENCOUNTER — Other Ambulatory Visit (HOSPITAL_COMMUNITY): Payer: Self-pay | Admitting: Internal Medicine

## 2024-11-27 ENCOUNTER — Other Ambulatory Visit (HOSPITAL_COMMUNITY): Payer: Self-pay | Admitting: Internal Medicine

## 2024-11-27 ENCOUNTER — Other Ambulatory Visit (HOSPITAL_COMMUNITY): Payer: Self-pay | Admitting: Physician Assistant

## 2024-12-18 ENCOUNTER — Other Ambulatory Visit (HOSPITAL_COMMUNITY): Payer: Self-pay | Admitting: Internal Medicine

## 2024-12-18 ENCOUNTER — Other Ambulatory Visit (HOSPITAL_COMMUNITY): Payer: Self-pay | Admitting: Physician Assistant

## 2024-12-20 ENCOUNTER — Ambulatory Visit (HOSPITAL_COMMUNITY)
Admission: RE | Admit: 2024-12-20 | Discharge: 2024-12-20 | Disposition: A | Discharge status: Home / Self Care | Source: Ambulatory Visit | Attending: Physician Assistant | Admitting: Physician Assistant

## 2024-12-20 VITALS — BP 120/64 | HR 71 | Ht 74.0 in | Wt 226.8 lb

## 2024-12-20 DIAGNOSIS — D6869 Other thrombophilia: Secondary | ICD-10-CM | POA: Diagnosis not present

## 2024-12-20 DIAGNOSIS — I480 Paroxysmal atrial fibrillation: Secondary | ICD-10-CM

## 2024-12-20 DIAGNOSIS — I4891 Unspecified atrial fibrillation: Secondary | ICD-10-CM

## 2024-12-20 MED ORDER — METOPROLOL TARTRATE 100 MG PO TABS: 50.0000 mg | ORAL_TABLET | Freq: Two times a day (BID) | ORAL | 3 refills | Status: AC

## 2024-12-20 MED ORDER — APIXABAN 2.5 MG PO TABS: 2.5000 mg | ORAL_TABLET | Freq: Two times a day (BID) | ORAL | 3 refills | Status: AC

## 2024-12-20 NOTE — Progress Notes (Signed)
 "   Primary Care Physician: Loring Tanda Mae, MD Primary Cardiologist: Dr Verlin  Primary Electrophysiologist: none Referring Physician: Dr Dann Norleen Timothy [name] is a 84 y.o. 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  The patient was initially diagnosed with atrial fibrillation and atrial flutter 11/26/22 after presenting to the ED with symptoms of SOB and a cough. He was diagnosed with influenza and incidentally found to be in atrial flutter. Per hospital notes, he also had afib on telemetry. His metoprolol  was increased and he was started on Eliquis  for stroke prevention. Patient purchased a Kardia mobile device which showed afib for several days after leaving the hospital but he converted to SR 2-3 days prior to his follow up on 12/08/22. He denies significant snoring or alcohol use.   However, patient was back in afib at follow up on 12/30/22 with symptoms of fluid overload. A Zio monitor was placed which showed 37% afib burden, avg HR 72 bpm.   Patient returns for follow up for atrial fibrillation. Patient is in afib today and unaware of his arrhythmia. He has not bee checking his Crist mobile recently so is unsure when this episode began. No bleeding issues on anticoagulation.   Today, he  denies symptoms of palpitations, chest pain, shortness of breath, orthopnea, PND, lower extremity edema, dizziness, presyncope, syncope, snoring, daytime somnolence, bleeding, or neurologic sequela. The patient is tolerating medications without difficulties and is otherwise without complaint today.    Atrial Fibrillation Risk Factors:  he does not have symptoms or diagnosis of sleep apnea. he does not have a history of  rheumatic fever. he does not have a history of alcohol use. The patient does not have a history of early familial atrial fibrillation or other arrhythmias.   Atrial Fibrillation Management history:  Previous antiarrhythmic drugs: none Previous cardioversions: none Previous ablations: none Anticoagulation history: Eliquis    Past Medical History:  Diagnosis Date   Acute lower GI bleeding 07/01/2013   Bilateral pulmonary embolism (HCC) 07/20/2013   CAD (coronary artery disease)    Coronary artery disease    NSTEMI 2001, 2003    Diabetes mellitus    Hemorrhagic shock (HCC) 07/05/2013   History of MI (myocardial infarction)    Hyperlipidemia    Hypertension    Inguinal hernia    Respiratory failure, post-operative 07/07/2013   Syncope, carotid sinus     Current Outpatient Medications  Medication Sig Dispense Refill   ELIQUIS  2.5 MG TABS tablet TAKE 1 TABLET BY MOUTH TWICE  DAILY 200 tablet 2   furosemide  (LASIX ) 40 MG tablet Take 40 mg by mouth as needed for fluid or edema.     gentamicin  cream (GARAMYCIN ) 0.1 % APPLY TO LEFT FOOT ULCER ONCE DAILY. 30 g 0   insulin  NPH-regular Human (NOVOLIN 70/30) (70-30) 100 UNIT/ML injection Patient injects 35 units in the morning and 38 units in the evening (Patient taking differently: Patient injects 20 units in the morning and 20 units in the evening)     JARDIANCE 10 MG TABS tablet Take 10 mg by mouth daily.     lisinopril (ZESTRIL) 10 MG tablet Take 10 mg by mouth daily.     loratadine  (CLARITIN ) 10 MG tablet  Take 10 mg by mouth daily as needed for allergies. (Patient taking differently: Take 10 mg by mouth as needed for allergies.)     metFORMIN  (GLUCOPHAGE ) 500 MG tablet Take 500 mg by mouth 2 (two) times daily with a meal.     metoprolol  tartrate (LOPRESSOR ) 100 MG tablet TAKE 1 TABLET (100 MG TOTAL) BY  MOUTH 2 (TWO) TIMES DAILY.  APPOINTMENT REQUIRED FOR FURTHER REFILLS [phone] (Patient taking differently: Take 50 mg by mouth 2  (two) times daily. Appointment Required For Further Refills [phone]) 60 tablet 0   ONETOUCH ULTRA test strip 1 each daily.     pravastatin  (PRAVACHOL ) 40 MG tablet TAKE 1 TABLET BY MOUTH IN THE  EVENING 100 tablet 2   RELION INSULIN  SYRINGE 31G X 15/64 1 ML MISC USE 1 TWICE DAILY TO INJECT A MAX OF 85 UNITS LABS IN APRIL     traMADol (ULTRAM) 50 MG tablet Take 50 mg by mouth as needed for moderate pain or severe pain.     No current facility-administered medications for this encounter.    ROS- All systems are reviewed and negative except as per the HPI above.  Physical Exam: Vitals:   12/20/24 1515  BP: 120/64  Pulse: 71  Weight: 102.9 kg  Height: 6' 2 (1.88 m)     GEN: Well nourished, well developed in no acute distress CARDIAC: Irregularly irregular rate and rhythm, no murmurs, rubs, gallops RESPIRATORY:  Clear to auscultation without rales, wheezing or rhonchi  ABDOMEN: Soft, non-tender, non-distended EXTREMITIES:  No edema; No deformity    Wt Readings from Last 3 Encounters:  12/20/24 102.9 kg  08/07/24 114.3 kg  03/08/24 114.3 kg    EKG Interpretation Date/Time:  Wednesday December 20 2024 15:42:08 EST Ventricular Rate:  71 PR Interval:    QRS Duration:  116 QT Interval:  380 QTC Calculation: 412 R Axis:   -43  Text Interpretation: Atrial fibrillation Left axis deviation Left anterior fasicular block Nonspecific ST abnormality Abnormal ECG When compared with ECG of 07-Aug-2024 18:26, No significant change was found Confirmed by Anjoli Diemer ([id number]) on 12/20/2024 3:53:37 PM    Echo 11/27/22 demonstrated   1. Left ventricular ejection fraction, by estimation, is 55 to 60%. The  left ventricle has normal function. The left ventricle has no regional  wall motion abnormalities. There is mild concentric left ventricular  hypertrophy. Left ventricular diastolic parameters are indeterminate.   2. Right ventricular systolic function is normal. The right ventricular   size is normal. Tricuspid regurgitation signal is inadequate for assessing  PA pressure.   3. The mitral valve is grossly normal. No evidence of mitral valve  regurgitation. No evidence of mitral stenosis.   4. The aortic valve is grossly normal. Aortic valve regurgitation is not  visualized. No aortic stenosis is present.   5. Aortic dilatation noted. 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   6. The inferior vena cava is normal in size with greater than 50%  respiratory variability, suggesting right atrial pressure of 3 mmHg.    Epic records are reviewed at length today   CHA2DS2-VASc Score = 6  The patient's score is based upon: CHF History: 1 HTN History: 1 Diabetes History: 1 Stroke History: 0 Vascular Disease History: 1 Age Score: 2 Gender Score: 0       ASSESSMENT AND PLAN: Paroxysmal Atrial Fibrillation/atrial flutter (ICD10:  I48.0) The  patient's CHA2DS2-VASc score is 6, indicating a 9.7% annual risk of stroke.   Patient in rate controlled afib today, asymptomatic.  We discussed rate vs rhythm control (amiodarone) today. He would like to pursue a conservative rate control strategy.  Continue Eliquis  5 mg BID Continue Lopressor  50 mg BID  Secondary Hypercoagulable State (ICD10:  D68.69) The patient is at significant risk for stroke/thromboembolism based upon his CHA2DS2-VASc Score of 6.  Continue Apixaban  (Eliquis ). No bleeding issues.   Chronic HFpEF EF 55-60% GDMT per primary cardiology team Fluid status appears stable today  HTN Stable on current regimen  CAD No anginal symptoms Followed by Dr Verlin   Follow up in the AF clinic in one year.    Daril Kicks PA-C [facility] [address] [place], [place] [address] [phone] 12/20/2024 3:54 PM "

## 2024-12-25 ENCOUNTER — Ambulatory Visit: Admitting: Podiatry

## 2024-12-25 DIAGNOSIS — E08621 Diabetes mellitus due to underlying condition with foot ulcer: Secondary | ICD-10-CM | POA: Diagnosis not present

## 2024-12-25 DIAGNOSIS — L97422 Non-pressure chronic ulcer of left heel and midfoot with fat layer exposed: Secondary | ICD-10-CM

## 2024-12-25 DIAGNOSIS — E11621 Type 2 diabetes mellitus with foot ulcer: Secondary | ICD-10-CM

## 2024-12-25 DIAGNOSIS — L97521 Non-pressure chronic ulcer of other part of left foot limited to breakdown of skin: Secondary | ICD-10-CM | POA: Diagnosis not present

## 2024-12-25 MED ORDER — GENTAMICIN SULFATE 0.1 % EX CREA: TOPICAL_CREAM | CUTANEOUS | 0 refills | Status: AC

## 2024-12-25 NOTE — Progress Notes (Signed)
"  °  Subjective:  Patient ID: Timothy [name], male    DOB: May 03, 1941,  MRN: [id number]  Chief Complaint  Patient presents with   Callouses    He has a callous on his left foot that you have treated before.    84 y.o. male returns with the above complaint. History confirmed with patient.  He returns for follow-up to reevaluate the ulcer on his plantar left foot  Objective:  Physical Exam: warm, good capillary refill, normal DP and PT pulses, reduced sensation at tips of toes and plantar foot bilaterally .  Bilaterally there is severe hallux valgus and digital contractures, the worst of which is the left foot with a and overlapping second toe and likely dislocation of the second metatarsophalangeal joint.    Full-thickness ulcer submetatarsal 2 left foot measuring 0.3 x 0.6 x 0.3 cm by subcutaneous tissue surrounding hyperkeratosis no active drainage or signs of infection       Radiographs: Not currently indicated, we will obtain these if his condition worsens Assessment:   1. 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  Ulcer left submet 2 -Ulceration has had some recurrence of debrided today of nonviable tissue overlying hyperkeratosis recommend continuing treatment utilizing gentamicin  ointment.  Refill sent to pharmacy.  Offloading pad dispensed and he has a offloading insole in his shoe.  If worsening or not healing may need to consider metatarsal head resection.  Follow-up with me in 4 weeks to reevaluate.  Return in about 4 weeks (around 01/22/2025) for wound care.        "

## 2025-01-16 ENCOUNTER — Ambulatory Visit: Admitting: Podiatry

## 2025-01-22 ENCOUNTER — Ambulatory Visit: Admitting: Podiatry
